# Patient Record
Sex: Male | Born: 1960
Health system: Southern US, Community
[De-identification: ages and names within clinical notes are randomized; demographics above are authoritative.]

## PROBLEM LIST (undated history)

## (undated) DIAGNOSIS — J45909 Unspecified asthma, uncomplicated: Secondary | ICD-10-CM

## (undated) DIAGNOSIS — E7800 Pure hypercholesterolemia, unspecified: Secondary | ICD-10-CM

## (undated) DIAGNOSIS — L509 Urticaria, unspecified: Secondary | ICD-10-CM

## (undated) DIAGNOSIS — G459 Transient cerebral ischemic attack, unspecified: Secondary | ICD-10-CM

## (undated) HISTORY — DX: Urticaria, unspecified: L50.9

## (undated) HISTORY — DX: Transient cerebral ischemic attack, unspecified: G45.9

## (undated) HISTORY — DX: Unspecified asthma, uncomplicated: J45.909

---

## 2002-12-18 ENCOUNTER — Emergency Department (HOSPITAL_COMMUNITY): Admission: EM | Admit: 2002-12-18 | Discharge: 2002-12-18 | Payer: Self-pay | Admitting: Emergency Medicine

## 2002-12-18 ENCOUNTER — Encounter: Payer: Self-pay | Admitting: Emergency Medicine

## 2005-12-25 DIAGNOSIS — G459 Transient cerebral ischemic attack, unspecified: Secondary | ICD-10-CM

## 2005-12-25 HISTORY — DX: Transient cerebral ischemic attack, unspecified: G45.9

## 2007-11-27 ENCOUNTER — Ambulatory Visit (HOSPITAL_BASED_OUTPATIENT_CLINIC_OR_DEPARTMENT_OTHER): Admission: RE | Admit: 2007-11-27 | Discharge: 2007-11-27 | Payer: Self-pay | Admitting: Urology

## 2010-05-29 ENCOUNTER — Ambulatory Visit: Payer: Self-pay | Admitting: Family Medicine

## 2010-05-29 DIAGNOSIS — J020 Streptococcal pharyngitis: Secondary | ICD-10-CM | POA: Insufficient documentation

## 2010-05-29 DIAGNOSIS — E785 Hyperlipidemia, unspecified: Secondary | ICD-10-CM

## 2011-01-24 NOTE — Letter (Signed)
Summary: Handout Printed  Printed Handout:  - Rheumatic Fever 

## 2011-01-24 NOTE — Assessment & Plan Note (Signed)
Summary: FEVER,CHILLS,NIGHTSWEATS, JOINT STIFFNESS/TJ x 3dys rm 3   Vital Signs:  Patient Profile:   50 Years Old Male CC:      Bite - Behind R calf x 3 dys Height:     71 inches Weight:      212 pounds O2 Sat:      100 % O2 treatment:    Room Air Temp:     100.9 degrees F oral Pulse rate:   78 / minute Pulse rhythm:   regular Resp:     16 per minute BP sitting:   134 / 89  (right arm) Cuff size:   regular  Vitals Entered By: Areta Haber CMA (May 29, 2010 3:43 PM)                  Current Allergies: No known allergies History of Present Illness Chief Complaint: Bite - Behind R calf x 3 dys History of Present Illness: Subjective: Patient complains of mild sore throat and sinus congestion for 2 days.  Three days ago he had some type of insect bite on his right posterior calf while in bed, now resolved. No cough No pleuritic pain No wheezing No post-nasal drainage No sinus pain/pressure No itchy/red eyes No earache No hemoptysis No SOB No fever, + chills No nausea No vomiting No abdominal pain No diarrhea No skin rashes + fatigue + myalgias +headache    Current Problems: PHARYNGITIS, STREPTOCOCCAL (ICD-034.0) HYPERLIPIDEMIA (ICD-272.4)   Current Meds LIPITOR 20 MG TABS (ATORVASTATIN CALCIUM) 1 tab by mouth once daily ASPIR-LOW 81 MG TBEC (ASPIRIN) 1 tab by mouth once daily PENICILLIN V POTASSIUM 500 MG TABS (PENICILLIN V POTASSIUM) 1 by mouth Q8hr for 10 days  REVIEW OF SYSTEMS Constitutional Symptoms       Complains of fever, chills, night sweats, and fatigue.     Denies weight loss and weight gain.  Eyes       Denies change in vision, eye pain, eye discharge, glasses, contact lenses, and eye surgery. Ear/Nose/Throat/Mouth       Denies hearing loss/aids, change in hearing, ear pain, ear discharge, dizziness, frequent runny nose, frequent nose bleeds, sinus problems, sore throat, hoarseness, and tooth pain or bleeding.  Respiratory       Denies  dry cough, productive cough, wheezing, shortness of breath, asthma, bronchitis, and emphysema/COPD.  Cardiovascular       Denies murmurs, chest pain, and tires easily with exhertion.    Gastrointestinal       Denies stomach pain, nausea/vomiting, diarrhea, constipation, blood in bowel movements, and indigestion. Genitourniary       Denies painful urination, kidney stones, and loss of urinary control. Neurological       Denies paralysis, seizures, and fainting/blackouts. Musculoskeletal       Complains of joint pain and joint stiffness.      Denies muscle pain, decreased range of motion, redness, swelling, muscle weakness, and gout.  Skin       Denies bruising, unusual mles/lumps or sores, and hair/skin or nail changes.  Psych       Denies mood changes, temper/anger issues, anxiety/stress, speech problems, depression, and sleep problems. Other Comments: Pt states he was bitten x 3 dys ago by bug(spider?).  Pt states he started feeling joint stiffness on Friday am while running.    Past History:  Past Medical History: Hyperlipidemia  Past Surgical History: Denies surgical history  Family History: Family History High cholesterol Family History Lung cancer Family History Ovarian cancer  Social  History: Never Smoked Alcohol use-yes- occasionally Drug use-no Regular exercise-yes Married Smoking Status:  never Drug Use:  no Does Patient Exercise:  yes   Objective:  Appearance:  Patient appears healthy, stated age, and in no acute distress  Eyes:  Pupils are equal, round, and reactive to light and accomdation.  Extraocular movement is intact.  Conjunctivae are not inflamed.  Ears:  Canals normal.  Tympanic membranes normal.   Nose:  Mildly congested, no sinus tenderness Pharynx:  Erythematous and slightly swollen without obstruction.  Minimal exudate.  Neck:  Supple.  Slightly tender shotty anterior nodes are palpated bilaterally.  Lungs:  Clear to auscultation.  Breath  sounds are equal.  Heart:  Regular rate and rhythm without murmurs, rubs, or gallops.  Abdomen:  Nontender without masses or hepatosplenomegaly.  Bowel sounds are present.  No CVA or flank tenderness.  Skin:  On the right lower leg there is a 3mm red macule without tenderness Rapid strep test positive Assessment New Problems: PHARYNGITIS, STREPTOCOCCAL (ICD-034.0) HYPERLIPIDEMIA (ICD-272.4)   Plan New Medications/Changes: PENICILLIN V POTASSIUM 500 MG TABS (PENICILLIN V POTASSIUM) 1 by mouth Q8hr for 10 days  #30 x 0, 05/29/2010, Donna Christen MD  New Orders: New Patient Level III [99203] Rapid Strep [98119] Planning Comments:   Begin penicillin 500mg  three times a day for 10 days.  Ibuprofen 200mg , 4 tabs every 8 hours with food  Warm saline gargles. Follow-up wtih PCP if not improving   The patient and/or caregiver has been counseled thoroughly with regard to medications prescribed including dosage, schedule, interactions, rationale for use, and possible side effects and they verbalize understanding.  Diagnoses and expected course of recovery discussed and will return if not improved as expected or if the condition worsens. Patient and/or caregiver verbalized understanding.  Prescriptions: PENICILLIN V POTASSIUM 500 MG TABS (PENICILLIN V POTASSIUM) 1 by mouth Q8hr for 10 days  #30 x 0   Entered and Authorized by:   Donna Christen MD   Signed by:   Donna Christen MD on 05/29/2010   Method used:   Print then Give to Patient   RxID:   [id number]   Orders Added: 1)  New Patient Level III [84696] 2)  Rapid Strep [29528]

## 2011-05-09 NOTE — Op Note (Signed)
NAMEKeiondre, [name]                   ACCOUNT NO.:  [id number]   MEDICAL RECORD NO.:  [id number]          PATIENT TYPE:  AMB   LOCATION:  [facility]                         FACILITY:  [facility]   PHYSICIAN:  Courtney Paris, M.D.DATE OF BIRTH:  [birth year]   DATE OF PROCEDURE:  11/27/2007  DATE OF DISCHARGE:                               OPERATIVE REPORT   PREOPERATIVE DIAGNOSIS:  Phimosis.   POSTOPERATIVE DIAGNOSIS:  Phimosis.   OPERATION:  Circumcision revision.   ANESTHESIA:  General.   SURGEON:  Courtney Paris, M.D.   BRIEF HISTORY:  This 50 year old black male has a band of skin covering  over part of his glans penis causing phimosis who enters now for  revision.  It collects bacteria causing irritation.  He does have a  history of herpes but no urinary symptoms.   The patient was placed on the operating table in the supine position.  After satisfactory induction of general anesthesia, he was prepped and  draped with Hibiclens because of his iodine allergy.  He was prepped and  draped in the usual sterile fashion.  The band of skin covered about 5  to 6 cm, covered over the dorsal right side of the glans.  A Hemostat  could be passed through this at the coronal sulcus.  The redundant skin  was then marked off with a marking pen and incised with a knife blade  and then using the Bovie with fine-needle electrode.  The rest of the  prep was then completed when this was opened.  The skin attached to the  glans was then closed with interrupted 4-0 chromic catgut suture to  effect a nice cosmetic result.  A dog ear of excess skin was then  removed off the penile shaft, and this defect closed with interrupted 4-  0 chromic catgut suture as well.  This afforded a nice cosmetic result  and no longer had phimosis.  A little bit of 1 mL of 0.25% Marcaine was  injected into the incision for postoperative pain, and a dressing of  collodion, Vaseline gauze, sterile gauze and Coban was  then used to  apply for a dressing.  The patient was taken to the recovery room in  good condition to be later discharged as an outpatient with detailed  written instructions.      Courtney Paris, M.D.  Electronically Signed     HMK/MEDQ  D:  11/27/2007  T:  11/27/2007  Job:  [id number]

## 2011-10-02 LAB — COMPREHENSIVE METABOLIC PANEL
ALT: 28
AST: 29
Albumin: 3.7
Alkaline Phosphatase: 63
BUN: 9
CO2: 30
Calcium: 9.7
Chloride: 104
Creatinine, Ser: 1.2
GFR calc Af Amer: >60
GFR calc non Af Amer: >60
Glucose, Bld: 92
Potassium: 4.6
Sodium: 141
Total Bilirubin: 1.1
Total Protein: 7

## 2011-10-02 LAB — CBC
HCT: 42.5
Hemoglobin: 14.5
MCHC: 34.2
MCV: 89.2
Platelets: 246
RBC: 4.76
RDW: 13.7
WBC: 5.4

## 2013-12-03 ENCOUNTER — Other Ambulatory Visit: Payer: Self-pay | Admitting: Orthopedic Surgery

## 2013-12-03 DIAGNOSIS — M25611 Stiffness of right shoulder, not elsewhere classified: Secondary | ICD-10-CM

## 2013-12-03 DIAGNOSIS — M25511 Pain in right shoulder: Secondary | ICD-10-CM

## 2013-12-22 ENCOUNTER — Ambulatory Visit
Admission: RE | Admit: 2013-12-22 | Discharge: 2013-12-22 | Disposition: A | Discharge status: Home / Self Care | Payer: BC Managed Care – PPO | Source: Ambulatory Visit | Attending: Orthopedic Surgery | Admitting: Orthopedic Surgery

## 2013-12-22 DIAGNOSIS — M25611 Stiffness of right shoulder, not elsewhere classified: Secondary | ICD-10-CM

## 2013-12-22 DIAGNOSIS — M25511 Pain in right shoulder: Secondary | ICD-10-CM

## 2015-12-02 ENCOUNTER — Encounter: Payer: Self-pay | Admitting: Internal Medicine

## 2015-12-02 ENCOUNTER — Ambulatory Visit (INDEPENDENT_AMBULATORY_CARE_PROVIDER_SITE_OTHER): Payer: BLUE CROSS/BLUE SHIELD | Admitting: Internal Medicine

## 2015-12-02 VITALS — BP 112/66 | HR 64 | Ht 72.0 in | Wt 230.2 lb

## 2015-12-02 DIAGNOSIS — J4530 Mild persistent asthma, uncomplicated: Secondary | ICD-10-CM | POA: Diagnosis not present

## 2015-12-02 DIAGNOSIS — R0600 Dyspnea, unspecified: Secondary | ICD-10-CM | POA: Diagnosis not present

## 2015-12-02 LAB — NITRIC OXIDE: Nitric Oxide: 71

## 2015-12-02 MED ORDER — MOMETASONE FURO-FORMOTEROL FUM 100-5 MCG/ACT IN AERO: INHALATION_SPRAY | RESPIRATORY_TRACT | Status: DC

## 2015-12-02 NOTE — Assessment & Plan Note (Signed)
Complicated by hyperlipidemia/ ? gerd  Body mass index is 31.21 kg/(m^2).  No results found for: TSH   Contributing to gerd tendency/  reviewed the need and the process to achieve and maintain neg calorie balance > defer f/u primary care including intermittently monitoring thyroid status

## 2015-12-02 NOTE — Progress Notes (Signed)
Subjective:    Patient ID: Scott Brandt, male    DOB: 1961/10/04,    MRN: [id number]  HPI  [age] yobm never smoker ? Some asthma as child but he has no recollection of symptoms/ need for meds and very athletic including marathon running but developed nasal symptoms x 2011  then became aerobically less active but then 2015 noted variable wheezing sounds but no real effect ex capacity  or effect sleep so self referred to pulmonary 12/02/2015 p instructed to use saba with variable results    12/02/2015 1st [facility] office visit/ [facility]   Chief Complaint  Patient presents with  . Pulmonary Consult    Self referral. Pt c/o wheezing on and off, mainly in the evening for the past yr.   [place]  pos [place] / does not remember the rest  rx wit allegra / flonase ok control  Wheezing occurs inconsistently p supper typically but not every night ? Related to what he eats? No change if uses proair 30 min before ex but if he uses the night before he runs he thinks it improves his exercise tolerance a little  No obvious other patterns in day to day or daytime variabilty or assoc chronic cough or cp or chest tightness,   overt sinus or hb symptoms. No unusual exp hx or h/o childhood pna/ asthma or knowledge of premature birth.  Sleeping ok without nocturnal  or early am exacerbation  of respiratory  c/o's or need for noct saba. Also denies any obvious fluctuation of symptoms with weather or environmental changes or other aggravating or alleviating factors except as outlined above   Current Medications, Allergies, Complete Past Medical History, Past Surgical History, Family History, and Social History were reviewed in [facility] electronic medical record.            Review of Systems  Constitutional: Negative for fever, chills, activity change, appetite change and unexpected weight change.  HENT: Negative for congestion, dental problem, postnasal drip, rhinorrhea,  sneezing, sore throat, trouble swallowing and voice change.   Eyes: Negative for visual disturbance.  Respiratory: Negative for cough, choking and shortness of breath.   Cardiovascular: Negative for chest pain and leg swelling.  Gastrointestinal: Negative for nausea, vomiting and abdominal pain.  Genitourinary: Negative for difficulty urinating.  Musculoskeletal: Negative for arthralgias.  Skin: Negative for rash.  Psychiatric/Behavioral: Negative for behavioral problems and confusion.       Objective:   Physical Exam  amb pleasant bm nad  Wt Readings from Last 3 Encounters:  12/02/15 230 lb 3.2 oz (104.418 kg)  05/29/10 212 lb (96.163 kg)    Vital signs reviewed  HEENT: nl dentition, turbinates, and oropharynx. Nl external ear canals without cough reflex   NECK :  without JVD/Nodes/TM/ nl carotid upstrokes bilaterally   LUNGS: no acc muscle use,  Nl contour chest which is clear to A and P bilaterally without cough on insp or exp maneuvers   CV:  RRR  no s3 or murmur or increase in P2, no edema   ABD:  soft and nontender with nl inspiratory excursion in the supine position. No bruits or organomegaly, bowel sounds nl  MS:  Nl gait/ ext warm without deformities, calf tenderness, cyanosis or clubbing No obvious joint restrictions   SKIN: warm and dry without lesions    NEURO:  alert, approp, nl sensorium with  no motor deficits         Assessment &  Plan:

## 2015-12-02 NOTE — Assessment & Plan Note (Signed)
-    N0 12/02/2015  = 71  -  Spirometry 12/02/2015  FEV1  2.78(81%) ratio 72 with fef 25-75 55%  - 12/02/2015  extensive coaching HFA effectiveness =    75% > try dulera 100 2bid  Strongly suspect that he has developed mild allergic asthma given the background of allergic rhinitis although some of his symptoms seem a bit atypical such as the need to use the albuterol the night before he exercises rather than 5 minutes before. That may be because his baseline HFA was poor and he wasn't getting the full benefit  Discussed in detail all the  indications, usual  risks and alternatives  relative to the benefits with patient who agrees to proceed with conservative rx with dulera trial and f/u 6 wks with prev allergy w/u in hand   Total time devoted to counseling  = 35/5073m review case with pt/ discussion of options/alternatives/ giving and going over instructions (see avs)

## 2015-12-02 NOTE — Patient Instructions (Addendum)
GERD (REFLUX)  is an extremely common cause of respiratory symptoms just like yours , many times with no obvious heartburn at all.    It can be treated with medication, but also with lifestyle changes including elevation of the head of your bed (ideally with 6 inch  bed blocks),  Smoking cessation, avoidance of late meals, excessive alcohol, and avoid fatty foods, chocolate, peppermint, colas, red wine, and acidic juices such as orange juice.  NO MINT OR MENTHOL PRODUCTS SO NO COUGH DROPS  USE SUGARLESS CANDY INSTEAD (Jolley ranchers or Stover's or Life Savers) or even ice chips will also do - the key is to swallow to prevent all throat clearing. NO OIL BASED VITAMINS - use powdered substitutes.    dulera 100 Take 2 puffs first thing in am and then another 2 puffs about 12 hours later x 2 week trial  - if doing great, ok to reduce to 1 one twice daily, if not then stop it and see what difference it makes    Please schedule a follow up office visit in 6 weeks, call sooner if needed bring your allergy work up with you

## 2015-12-08 ENCOUNTER — Ambulatory Visit (INDEPENDENT_AMBULATORY_CARE_PROVIDER_SITE_OTHER): Payer: BLUE CROSS/BLUE SHIELD | Admitting: Allergy and Immunology

## 2015-12-08 ENCOUNTER — Encounter: Payer: Self-pay | Admitting: Allergy and Immunology

## 2015-12-08 VITALS — BP 128/72 | HR 80 | Temp 97.8°F | Resp 18

## 2015-12-08 DIAGNOSIS — J4530 Mild persistent asthma, uncomplicated: Secondary | ICD-10-CM

## 2015-12-08 DIAGNOSIS — T7800XA Anaphylactic reaction due to unspecified food, initial encounter: Secondary | ICD-10-CM | POA: Insufficient documentation

## 2015-12-08 DIAGNOSIS — J3089 Other allergic rhinitis: Secondary | ICD-10-CM | POA: Diagnosis not present

## 2015-12-08 MED ORDER — AZELASTINE-FLUTICASONE 137-50 MCG/ACT NA SUSP: 1.0000 | Freq: Two times a day (BID) | NASAL | Status: DC

## 2015-12-08 MED ORDER — MONTELUKAST SODIUM 10 MG PO TABS
10.0000 mg | ORAL_TABLET | Freq: Every day | ORAL | Status: DC
Start: 2015-12-08 — End: 2016-01-13

## 2015-12-08 MED ORDER — LEVOCETIRIZINE DIHYDROCHLORIDE 5 MG PO TABS: 5.0000 mg | ORAL_TABLET | Freq: Every evening | ORAL | Status: DC

## 2015-12-08 NOTE — Patient Instructions (Addendum)
Perennial and seasonal allergic rhinitis  Aeroallergen avoidance measures have been discussed and provided in written form.  A prescription has been provided for Dymista (azelastine/fluticasone) nasal spray, 1 spray per nostril twice daily as needed. Proper nasal spray technique has been discussed and demonstrated.  A prescription has been provided for montelukast 10 mg daily at bedtime.  A prescription has been provided for levocetirizine, 5 mg daily as needed.  The risks and benefits of aeroallergen immunotherapy have been discussed. The patient is motivated to initiate immunotherapy to reduce symptoms and decrease medication requirement. Informed consent has been signed and allergen vaccine orders have been submitted. Medications will be decreased or discontinued as symptom relief from immunotherapy becomes evident.  Mild persistent asthma in adult without complication  For now, continue Dulera 100/5 g, 2 inhalations twice a day.  To maximize pulmonary deposition, a spacer has been provided along with instructions for its proper administration with an HFA inhaler.  Upper prescription has been provided for montelukast (as above).  Continue albuterol HFA, 1-2 inhalations every 4-6 hours as needed.  Allergy with anaphylaxis due to food  Meticulous avoidance of shellfish as discussed.  Continue to have access of epinephrine auto-injector 2 pack. Iinstructions for proper administration have been provided.  A food allergy action plan has been provided and discussed.  Medic Alert identification is recommended.    Return in about 8 weeks (around 02/02/2016), or if symptoms worsen or fail to improve.  Reducing Pollen Exposure  The American Academy of Allergy, Asthma and Immunology suggests the following steps to reduce your exposure to pollen during allergy seasons.    1. Do not hang sheets or clothing out to dry; pollen may collect on these items. 2. Do not mow lawns or spend time  around freshly cut grass; mowing stirs up pollen. 3. Keep windows closed at night.  Keep car windows closed while driving. 4. Minimize morning activities outdoors, a time when pollen counts are usually at their highest. 5. Stay indoors as much as possible when pollen counts or humidity is high and on windy days when pollen tends to remain in the air longer. 6. Use air conditioning when possible.  Many air conditioners have filters that trap the pollen spores. 7. Use a HEPA room air filter to remove pollen form the indoor air you breathe.   Control of House Dust Mite Allergen  House dust mites play a major role in allergic asthma and rhinitis.  They occur in environments with high humidity wherever human skin, the food for dust mites is found. High levels have been detected in dust obtained from mattresses, pillows, carpets, upholstered furniture, bed covers, clothes and soft toys.  The principal allergen of the house dust mite is found in its feces.  A gram of dust may contain 1,000 mites and 250,000 fecal particles.  Mite antigen is easily measured in the air during house cleaning activities.    1. Encase mattresses, including the box spring, and pillow, in an air tight cover.  Seal the zipper end of the encased mattresses with wide adhesive tape. 2. Wash the bedding in water of 130 degrees Farenheit weekly.  Avoid cotton comforters/quilts and flannel bedding: the most ideal bed covering is the dacron comforter. 3. Remove all upholstered furniture from the bedroom. 4. Remove carpets, carpet padding, rugs, and non-washable window drapes from the bedroom.  Wash drapes weekly or use plastic window coverings. 5. Remove all non-washable stuffed toys from the bedroom.  Wash stuffed toys weekly. 6.  Have the room cleaned frequently with a vacuum cleaner and a damp dust-mop.  The patient should not be in a room which is being cleaned and should wait 1 hour after cleaning before going into the  room. 7. Close and seal all heating outlets in the bedroom.  Otherwise, the room will become filled with dust-laden air.  An electric heater can be used to heat the room. Reduce indoor humidity to less than 50%.  Do not use a humidifier.  Control of Dog or Cat Allergen  Avoidance is the best way to manage a dog or cat allergy. If you have a dog or cat and are allergic to dog or cats, consider removing the dog or cat from the home. If you have a dog or cat but don't want to find it a new home, or if your family wants a pet even though someone in the household is allergic, here are some strategies that may help keep symptoms at bay:  1. Keep the pet out of your bedroom and restrict it to only a few rooms. Be advised that keeping the dog or cat in only one room will not limit the allergens to that room. 2. Don't pet, hug or kiss the dog or cat; if you do, wash your hands with soap and water. 3. High-efficiency particulate air (HEPA) cleaners run continuously in a bedroom or living room can reduce allergen levels over time. 4. Regular use of a high-efficiency vacuum cleaner or a central vacuum can reduce allergen levels. 5. Giving your dog or cat a bath at least once a week can reduce airborne allergen.  Control of Mold Allergen  Mold and fungi can grow on a variety of surfaces provided certain temperature and moisture conditions exist.  Outdoor molds grow on plants, decaying vegetation and soil.  The major outdoor mold, Alternaria and Cladosporium, are found in very high numbers during hot and dry conditions.  Generally, a late Summer - Fall peak is seen for common outdoor fungal spores.  Rain will temporarily lower outdoor mold spore count, but counts rise rapidly when the rainy period ends.  The most important indoor molds are Aspergillus and Penicillium.  Dark, humid and poorly ventilated basements are ideal sites for mold growth.  The next most common sites of mold growth are the bathroom and the  kitchen.  Outdoor [employer] 2. Use air conditioning and keep windows closed 3. Avoid exposure to decaying vegetation. 4. Avoid leaf raking. 5. Avoid grain handling. 6. Consider wearing a face mask if working in moldy areas.  Indoor Mold Control 1. Maintain humidity below 50%. 2. Clean washable surfaces with 5% bleach solution. 3. Remove sources e.g. Contaminated carpets.  Control of Cockroach Allergen  Cockroach allergen has been identified as an important cause of acute attacks of asthma, especially in urban settings.  There are fifty-five species of cockroach that exist in the [place], however only three, the [place], [place] species produce allergen that can affect patients with Asthma.  Allergens can be obtained from fecal particles, egg casings and secretions from cockroaches.    1. Remove food sources. 2. Reduce access to water. 3. Seal access and entry points. 4. Spray runways with 0.5-1% Diazinon or Chlorpyrifos 5. Blow boric acid power under stoves and refrigerator. 6. Place bait stations (hydramethylnon) at feeding sites.

## 2015-12-08 NOTE — Progress Notes (Addendum)
NEW PATIENT NOTE  RE: Scott Brandt MRN: [id number] DOB: [birth year] [facility] [place] [address] [place] [id number] Date of Office Visit: 12/08/2015  Referring provider: No referring provider defined for this encounter.  Chief Complaint: Wheezing; Allergic Rhinitis ; and Food Intolerance   History of present illness: HPI Comments: Scott Pullingric L Holstrom is a 54 y.o. male  Who presents today for his initial evaluation. He complains of nasal congestion , whistling through the nostrils, postnasal drainage.  These symptoms occur year around but her more severe in the springtime and in the fall.  He had childhood asthma , however his asthma symptoms seem to resolve through adolescence and early adulthood. Over this past year, he has experienced episodes of dyspnea and wheezing. He was evaluated his pulmonologist, Dr. Sherene SiresWert,  last week and started on Dulera 100/5 g, 2 inhalations twice a day.  Minerva Areolaric states that on multiple occasions he has experienced laryngeal pruritus, dyspnea, and chest tightness with the consumption of crab, shrimp, and lobster. He avoids shellfish and has access to an epinephrine autoinjector 2 pack.   Assessment and plan: Perennial and seasonal allergic rhinitis  Aeroallergen avoidance measures have been discussed and provided in written form.  A prescription has been provided for Dymista (azelastine/fluticasone) nasal spray, 1 spray per nostril twice daily as needed. Proper nasal spray technique has been discussed and demonstrated.  A prescription has been provided for montelukast 10 mg daily at bedtime.  A prescription has been provided for levocetirizine, 5 mg daily as needed.  The risks and benefits of aeroallergen immunotherapy have been discussed. The patient is motivated to initiate immunotherapy to reduce symptoms and decrease medication requirement. Informed consent has been signed and allergen vaccine orders  have been submitted. Medications will be decreased or discontinued as symptom relief from immunotherapy becomes evident.  Mild persistent asthma in adult without complication  For now, continue Dulera 100/5 g, 2 inhalations twice a day.  To maximize pulmonary deposition, a spacer has been provided along with instructions for its proper administration with an HFA inhaler.  Upper prescription has been provided for montelukast (as above).  Continue albuterol HFA, 1-2 inhalations every 4-6 hours as needed.  Allergy with anaphylaxis due to food  Meticulous avoidance of shellfish as discussed.  Continue to have access of epinephrine auto-injector 2 pack. Iinstructions for proper administration have been provided.  A food allergy action plan has been provided and discussed.  Medic Alert identification is recommended.    Medications ordered this encounter: Meds ordered this encounter  Medications  . Azelastine-Fluticasone (DYMISTA) 137-50 MCG/ACT SUSP    Sig: Place 1 spray into both nostrils 2 (two) times daily.    Dispense:  1 Bottle    Refill:  5  . DISCONTD: montelukast (SINGULAIR) 10 MG tablet    Sig: Take 1 tablet (10 mg total) by mouth at bedtime.    Dispense:  30 tablet    Refill:  3  . DISCONTD: levocetirizine (XYZAL) 5 MG tablet    Sig: Take 1 tablet (5 mg total) by mouth every evening.    Dispense:  30 tablet    Refill:  5    Diagnositics: Spirometry: FVC was 3.17 L and FEV1 was 2.74 L (86% predicted) with significant (470 mL, 17%) post bronchodilator improvement. Allergy skin testing: Positive to grass pollen, weed pollen, ragweed pollen, tree pollen, mold, cat hair, dog epithelia, dust mite, cockroach antigen.  Physical examination: Blood pressure 128/72, pulse 80, temperature 97.8 F (36.6 C), resp. rate 18, SpO2 98 %.  General: Alert, interactive, in no acute distress. HEENT: TMs pearly gray, turbinates edematous without discharge, post-pharynx  erythematous. Neck: Supple without lymphadenopathy. Lungs: Clear to auscultation without wheezing, rhonchi or rales. CV: Normal S1, S2 without murmurs. Abdomen: Nondistended, nontender. Skin: Warm and dry, without lesions or rashes. Extremities:  No clubbing, cyanosis or edema. Neuro:   Grossly intact.  Review of systems: Review of Systems  Constitutional: Negative for fever, chills and weight loss.  HENT: Positive for congestion. Negative for nosebleeds.   Eyes: Negative for blurred vision.  Respiratory: Positive for shortness of breath and wheezing. Negative for hemoptysis.   Cardiovascular: Negative for chest pain.  Gastrointestinal: Negative for diarrhea and constipation.  Genitourinary: Negative for dysuria.  Musculoskeletal: Negative for myalgias and joint pain.  Skin: Negative for itching and rash.  Neurological: Negative for dizziness.  Endo/Heme/Allergies: Positive for environmental allergies. Does not bruise/bleed easily.    Past medical history: Past Medical History  Diagnosis Date  . Urticaria   . Asthma     signs of asthma from Dr Sherene Sires     Past surgical history: No past surgical history on file.  Family history: Family History  Problem Relation Age of Onset  . Allergic rhinitis Father   . Angioedema Neg Hx   . Asthma Neg Hx   . Atopy Neg Hx   . Eczema Neg Hx   . Immunodeficiency Neg Hx   . Urticaria Neg Hx     Social history: Social History   Social History  . Marital Status: Married    Spouse Name: N/A  . Number of Children: N/A  . Years of Education: N/A   Occupational History  . Not on file.   Social History Main Topics  . Smoking status: Never Smoker   . Smokeless tobacco: Never Used  . Alcohol Use: No  . Drug Use: No  . Sexual Activity: Not on file   Other Topics Concern  . Not on file   Social History Narrative   Environmental History:  Hanish lives in an 86-year-old house with hardwood floors throughout and central air/heat.   There is a cat in the house which has access to his bedroom.  He is a nonsmoker and there are no smokers in the household.  Known medication allergies: Allergies  Allergen Reactions  . Shellfish Allergy Anaphylaxis    Outpatient medications:   Medication List       This list is accurate as of: 12/08/15 11:59 PM.  Always use your most recent med list.               aspirin 81 MG tablet  Take 81 mg by mouth daily.     Azelastine-Fluticasone 137-50 MCG/ACT Susp  Commonly known as:  DYMISTA  Place 1 spray into both nostrils 2 (two) times daily.     fexofenadine 180 MG tablet  Commonly known as:  ALLEGRA  Take 180 mg by mouth as needed.     fluticasone 50 MCG/ACT nasal spray  Commonly known as:  FLONASE  Place 2 sprays into both nostrils daily as needed.     levocetirizine 5 MG tablet  Commonly known as:  XYZAL  Take 1 tablet (5 mg total) by mouth every evening.     LIPITOR PO  Take 1 tablet by mouth daily.     mometasone-formoterol 100-5 MCG/ACT Aero  Commonly known as:  [employer]  Take 2 puffs first thing in am and then another 2 puffs about 12 hours later.     montelukast 10 MG tablet  Commonly known as:  SINGULAIR  Take 1 tablet (10 mg total) by mouth at bedtime.     PROAIR HFA 108 (90 Base) MCG/ACT inhaler  Generic drug:  albuterol  Inhale 2 puffs into the lungs every 4 (four) hours as needed.        I appreciate the opportunity to take part in this Idrees's care. Please do not hesitate to contact me with questions.  Sincerely,   R. Jorene Guest, MD

## 2015-12-08 NOTE — Assessment & Plan Note (Addendum)
   For now, continue Dulera 100/5 g, 2 inhalations twice a day.  To maximize pulmonary deposition, a spacer has been provided along with instructions for its proper administration with an HFA inhaler.  Upper prescription has been provided for montelukast (as above).  Continue albuterol HFA, 1-2 inhalations every 4-6 hours as needed.

## 2015-12-08 NOTE — Assessment & Plan Note (Addendum)
   Aeroallergen avoidance measures have been discussed and provided in written form.  A prescription has been provided for Dymista (azelastine/fluticasone) nasal spray, 1 spray per nostril twice daily as needed. Proper nasal spray technique has been discussed and demonstrated.  A prescription has been provided for montelukast 10 mg daily at bedtime.  A prescription has been provided for levocetirizine, 5 mg daily as needed.  The risks and benefits of aeroallergen immunotherapy have been discussed. The patient is motivated to initiate immunotherapy to reduce symptoms and decrease medication requirement. Informed consent has been signed and allergen vaccine orders have been submitted. Medications will be decreased or discontinued as symptom relief from immunotherapy becomes evident.

## 2015-12-08 NOTE — Assessment & Plan Note (Addendum)
   Meticulous avoidance of shellfish as discussed.  Continue to have access of epinephrine auto-injector 2 pack. Iinstructions for proper administration have been provided.  A food allergy action plan has been provided and discussed.  Medic Alert identification is recommended.

## 2015-12-09 DIAGNOSIS — J3081 Allergic rhinitis due to animal (cat) (dog) hair and dander: Secondary | ICD-10-CM | POA: Diagnosis not present

## 2015-12-10 DIAGNOSIS — J3089 Other allergic rhinitis: Secondary | ICD-10-CM | POA: Diagnosis not present

## 2016-01-13 ENCOUNTER — Encounter: Payer: Self-pay | Admitting: Internal Medicine

## 2016-01-13 ENCOUNTER — Ambulatory Visit (INDEPENDENT_AMBULATORY_CARE_PROVIDER_SITE_OTHER): Payer: BLUE CROSS/BLUE SHIELD | Admitting: Internal Medicine

## 2016-01-13 VITALS — BP 138/88 | HR 71 | Ht 72.0 in | Wt 226.0 lb

## 2016-01-13 DIAGNOSIS — J4530 Mild persistent asthma, uncomplicated: Secondary | ICD-10-CM | POA: Diagnosis not present

## 2016-01-13 NOTE — Progress Notes (Signed)
Subjective:    Patient ID: Scott Brandt, male    DOB: [birth year],    MRN: [id number]    Brief patient profile:  54 yobm never smoker ? Some asthma as child but he has no recollection of symptoms/ need for meds and very athletic including marathon running but developed nasal symptoms x 2011  then became aerobically less active but then 2015 noted variable wheezing sounds but no real effect ex capacity  or effect sleep so self referred to pulmonary 12/02/2015 p instructed to use saba with variable results    History of Present Illness  12/02/2015 1st [facility] office visit/ Radek Carnero   Chief Complaint  Patient presents with  . Pulmonary Consult    Self referral. Pt c/o wheezing on and off, mainly in the evening for the past yr.   eval by allergist = Kathryne Sharper 2013  pos trees / does not remember the rest  rx with allegra / flonase ok control  Wheezing occurs inconsistently p supper typically but not every night ? Related to what he eats? No change if uses proair 30 min before ex but if he uses the night before he runs he thinks it improves his exercise tolerance a little rec GERD diet  Dulera 100 Take 2 puffs first thing in am and then another 2 puffs about 12 hours later x 2 week trial  - if doing great, ok to reduce to 1 one twice daily, if not then stop it and see what difference it makes  Please schedule a follow up office visit in 6 weeks, call sooner if needed bring your allergy work up with you > did not do    01/13/2016  f/u ov/Allaina Brotzman re: asthma/ atopic per allergist in [place] who instructed him to use spacer  Chief Complaint  Patient presents with  . Follow-up    Breathing has improved some. He has not noticed anymore wheezing.    Not limited by breathing from desired activities  / ran up to 2 miles s difficulty or need for saba   No obvious day to day or daytime variability or assoc chronic cough or cp or chest tightness, subjective wheeze or overt sinus or hb symptoms.  No unusual exp hx or h/o childhood pna/ asthma or knowledge of premature birth.  Sleeping ok without nocturnal  or early am exacerbation  of respiratory  c/o's or need for noct saba. Also denies any obvious fluctuation of symptoms with weather or environmental changes or other aggravating or alleviating factors except as outlined above   Current Medications, Allergies, Complete Past Medical History, Past Surgical History, Family History, and Social History were reviewed in [employer] record.  ROS  The following are not active complaints unless bolded sore throat, dysphagia, dental problems, itching, sneezing,  nasal congestion or excess/ purulent secretions, ear ache,   fever, chills, sweats, unintended wt loss, classically pleuritic or exertional cp, hemoptysis,  orthopnea pnd or leg swelling, presyncope, palpitations, abdominal pain, anorexia, nausea, vomiting, diarrhea  or change in bowel or bladder habits, change in stools or urine, dysuria,hematuria,  rash, arthralgias, visual complaints, headache, numbness, weakness or ataxia or problems with walking or coordination,  change in mood/affect or memory.              Objective:   Physical Exam  amb pleasant bm nad  Wt Readings from Last 3 Encounters:  01/13/16 226 lb (102.513 kg)  12/02/15 230 lb 3.2 oz (104.418 kg)  05/29/10 212  lb (96.163 kg)    Vital signs reviewed   HEENT: nl dentition, turbinates, and oropharynx. Nl external ear canals without cough reflex   NECK :  without JVD/Nodes/TM/ nl carotid upstrokes bilaterally   LUNGS: no acc muscle use,  Nl contour chest which is clear to A and P bilaterally without cough on insp or exp maneuvers   CV:  RRR  no s3 or murmur or increase in P2, no edema   ABD:  soft and nontender with nl inspiratory excursion in the supine position. No bruits or organomegaly, bowel sounds nl  MS:  Nl gait/ ext warm without deformities, calf tenderness, cyanosis or  clubbing No obvious joint restrictions   SKIN: warm and dry without lesions    NEURO:  alert, approp, nl sensorium with  no motor deficits         Assessment & Plan:   Outpatient Encounter Prescriptions as of 01/13/2016  Medication Sig  . aspirin 81 MG tablet Take 81 mg by mouth daily.  . Atorvastatin Calcium (LIPITOR PO) Take 1 tablet by mouth daily.  . fexofenadine (ALLEGRA) 180 MG tablet Take 180 mg by mouth daily.  . fluticasone (FLONASE) 50 MCG/ACT nasal spray Place 2 sprays into both nostrils daily as needed.   . mometasone-formoterol (DULERA) 100-5 MCG/ACT AERO Take 2 puffs first thing in am and then another 2 puffs about 12 hours later.  Marland Kitchen PROAIR HFA 108 (90 BASE) MCG/ACT inhaler Inhale 2 puffs into the lungs every 4 (four) hours as needed.  . Azelastine-Fluticasone (DYMISTA) 137-50 MCG/ACT SUSP Place 1 spray into both nostrils 2 (two) times daily. (Patient not taking: Reported on 01/13/2016)  . [DISCONTINUED] levocetirizine (XYZAL) 5 MG tablet Take 1 tablet (5 mg total) by mouth every evening.  . [DISCONTINUED] montelukast (SINGULAIR) 10 MG tablet Take 1 tablet (10 mg total) by mouth at bedtime. (Patient not taking: Reported on 01/13/2016)   No facility-administered encounter medications on file as of 01/13/2016.

## 2016-01-13 NOTE — Assessment & Plan Note (Addendum)
-    N0 12/02/2015  = 71  -  Spirometry 12/02/2015  FEV1  2.78(81%) ratio 72 with fef 25-75 55%  - 12/02/2015  extensive coaching HFA effectiveness =    75% > try dulera 100 2bid  I had an extended final summary discussion with the patient reviewing all relevant studies completed to date and  lasting 15 to 20 minutes of a 25 minute visit on the following issues:    All goals of chronic asthma control met including optimal function and elimination of symptoms with minimal need for rescue therapy.  Contingencies discussed in full including contacting this office immediately if not controlling the symptoms using the rule of two's.     Each maintenance medication was reviewed in detail including most importantly the difference between maintenance and as needed and under what circumstances the prns are to be used.  Please see instructions for details which were reviewed in writing and the patient given a copy.

## 2016-01-13 NOTE — Patient Instructions (Addendum)
If doing great including exercise and not needing the rescue inhaler (RED= Proair) more than a few times a week x 3  Straight months, then your allergist can consider step down to milder inhalers  Pulmonary follow up is as needed as long as you are under the care of an allergist and satisfied with your breathing.

## 2016-02-02 ENCOUNTER — Ambulatory Visit (INDEPENDENT_AMBULATORY_CARE_PROVIDER_SITE_OTHER): Payer: BLUE CROSS/BLUE SHIELD | Admitting: Allergy and Immunology

## 2016-02-02 ENCOUNTER — Encounter: Payer: Self-pay | Admitting: Allergy and Immunology

## 2016-02-02 VITALS — BP 130/80 | HR 72 | Resp 20

## 2016-02-02 DIAGNOSIS — J4530 Mild persistent asthma, uncomplicated: Secondary | ICD-10-CM

## 2016-02-02 DIAGNOSIS — J3089 Other allergic rhinitis: Secondary | ICD-10-CM | POA: Diagnosis not present

## 2016-02-02 DIAGNOSIS — T7800XD Anaphylactic reaction due to unspecified food, subsequent encounter: Secondary | ICD-10-CM | POA: Diagnosis not present

## 2016-02-02 MED ORDER — BECLOMETHASONE DIPROPIONATE 80 MCG/ACT IN AERS: 2.0000 | INHALATION_SPRAY | Freq: Two times a day (BID) | RESPIRATORY_TRACT | Status: DC

## 2016-02-02 NOTE — Assessment & Plan Note (Signed)
   Initiate aeroallergen immunotherapy.  Medications will be decreased or discontinued as symptom reduction from immunotherapy becomes evident.  Continue Dymista nasal spray as needed.  In context of his perceived somnolence with levocetirizine, if an antihistamine is required, I have recommended fexofenadine 180 mg daily as needed.

## 2016-02-02 NOTE — Assessment & Plan Note (Signed)
   A prescription has been provided for Qvar (beclomethasone) 80 g, 2 inhalations via spacer device twice a day.  If his symptoms remain well controlled, he may decrease dose to 1 inhalation via spacer device twice a day.  Discontinue Dulera 100/5 g.  Continue albuterol HFA, 1-2 inhalations every 4-6 hours as needed and 15 minutes prior to exercise.  Subjective and objective measures of pulmonary function will be followed and the treatment plan will be adjusted accordingly.

## 2016-02-02 NOTE — Addendum Note (Signed)
Addended by: Clifton James on: 02/02/2016 05:14 PM   Modules accepted: Orders

## 2016-02-02 NOTE — Progress Notes (Signed)
Follow-up Note  RE: Scott Brandt MRN: [id number] DOB: 06/07/1961 Date of Office Visit: 02/02/2016  Primary care provider: Wilson Singer, MD Referring provider: Wilson Singer, MD  History of present illness: HPI Comments: Scott Brandt is a 55 y.o. male persistent asthma, allergic rhinitis, and food allergy who presents today for follow up. He reports that he has not initiated immunotherapy yet but is interested in doing so. He decreased Dulera 100/5 g to 2 inhalations via spacer device one time per day. Despite this decrease in medication he has not experienced asthma symptoms with exercise or in the evenings as he previously had been. He discontinued montelukast and levocetirize because of perceived somnolence. He has no nasal symptom complaints today.   Assessment and plan: Mild persistent asthma in adult without complication  A prescription has been provided for Qvar (beclomethasone) 80 g, 2 inhalations via spacer device twice a day.  If his symptoms remain well controlled, he may decrease dose to 1 inhalation via spacer device twice a day.  Discontinue Dulera 100/5 g.  Continue albuterol HFA, 1-2 inhalations every 4-6 hours as needed and 15 minutes prior to exercise.  Subjective and objective measures of pulmonary function will be followed and the treatment plan will be adjusted accordingly.  Perennial and seasonal allergic rhinitis  Initiate aeroallergen immunotherapy.  Medications will be decreased or discontinued as symptom reduction from immunotherapy becomes evident.  Continue Dymista nasal spray as needed.  In context of his perceived somnolence with levocetirizine, if an antihistamine is required, I have recommended fexofenadine 180 mg daily as needed.    Meds ordered this encounter  Medications  . beclomethasone (QVAR) 80 MCG/ACT inhaler    Sig: Inhale 2 puffs into the lungs 2 (two) times daily.    Dispense:  1 Inhaler    Refill:  5     Diagnositics: Spirometry:  Normal with an FEV1 of 89% predicted.  Please see scanned spirometry results for details.    Physical examination: Blood pressure 130/80, pulse 72, resp. rate 20.  General: Alert, interactive, in no acute distress. HEENT: TMs pearly gray, turbinates moderately edematous with clear discharge, post-pharynx mildly erythematous. Neck: Supple without lymphadenopathy. Lungs: Clear to auscultation without wheezing, rhonchi or rales. CV: Normal S1, S2 without murmurs. Skin: Warm and dry, without lesions or rashes.  The following portions of the patient's history were reviewed and updated as appropriate: allergies, current medications, past family history, past medical history, past social history, past surgical history and problem list.    Medication List       This list is accurate as of: 02/02/16  5:10 PM.  Always use your most recent med list.               aspirin 81 MG tablet  Take 81 mg by mouth daily.     Azelastine-Fluticasone 137-50 MCG/ACT Susp  Commonly known as:  DYMISTA  Place 1 spray into both nostrils 2 (two) times daily.     beclomethasone 80 MCG/ACT inhaler  Commonly known as:  QVAR  Inhale 2 puffs into the lungs 2 (two) times daily.     fexofenadine 180 MG tablet  Commonly known as:  ALLEGRA  Take 180 mg by mouth as needed.     fluticasone 50 MCG/ACT nasal spray  Commonly known as:  FLONASE  Place 2 sprays into both nostrils daily as needed.     LIPITOR PO  Take 1 tablet by mouth daily.     mometasone-formoterol  100-5 MCG/ACT Aero  Commonly known as:  DULERA  Take 2 puffs first thing in am and then another 2 puffs about 12 hours later.     PROAIR HFA 108 (90 Base) MCG/ACT inhaler  Generic drug:  albuterol  Inhale 2 puffs into the lungs every 4 (four) hours as needed.        Allergies  Allergen Reactions  . Shellfish Allergy Anaphylaxis    I appreciate the opportunity to take part in this [name]'s care. Please do  not hesitate to contact me with questions.  Sincerely,   R. Jorene Guest, MD

## 2016-02-02 NOTE — Patient Instructions (Signed)
Mild persistent asthma in adult without complication  A prescription has been provided for Qvar (beclomethasone) 80 g, 2 inhalations via spacer device twice a day.  If his symptoms remain well controlled, he may decrease dose to 1 inhalation via spacer device twice a day.  Discontinue Dulera 100/5 g.  Continue albuterol HFA, 1-2 inhalations every 4-6 hours as needed and 15 minutes prior to exercise.  Subjective and objective measures of pulmonary function will be followed and the treatment plan will be adjusted accordingly.  Perennial and seasonal allergic rhinitis  Initiate aeroallergen immunotherapy.  Medications will be decreased or discontinued as symptom reduction from immunotherapy becomes evident.  Continue Dymista nasal spray as needed.  In context of his perceived somnolence with levocetirizine, if an antihistamine is required, I have recommended fexofenadine 180 mg daily as needed.   Return in about 4 months (around 06/01/2016), or if symptoms worsen or fail to improve.

## 2016-02-10 ENCOUNTER — Ambulatory Visit (INDEPENDENT_AMBULATORY_CARE_PROVIDER_SITE_OTHER): Payer: BLUE CROSS/BLUE SHIELD

## 2016-02-10 DIAGNOSIS — J309 Allergic rhinitis, unspecified: Secondary | ICD-10-CM

## 2016-02-10 NOTE — Progress Notes (Signed)
Immunotherapy   Patient Details  Name: Scott Brandt MRN: [id number] Date of Birth: Jan 20, 1961  02/10/2016  [name] started injections for Blue 1:100,000 (grass-weed-tree-dmite and mold-cat-dog-cr) Following schedule: A  Frequency:2 times per week Epi-Pen:Epi-Pen Avaiable . No problems after 30 minute wait. Consent signed and patient instructions given.   [name] 02/10/2016, 4:31 PM

## 2016-02-25 ENCOUNTER — Ambulatory Visit (INDEPENDENT_AMBULATORY_CARE_PROVIDER_SITE_OTHER): Payer: BLUE CROSS/BLUE SHIELD | Admitting: *Deleted

## 2016-02-25 DIAGNOSIS — J309 Allergic rhinitis, unspecified: Secondary | ICD-10-CM

## 2016-02-28 ENCOUNTER — Emergency Department (HOSPITAL_COMMUNITY): Payer: BLUE CROSS/BLUE SHIELD | Admitting: Anesthesiology

## 2016-02-28 ENCOUNTER — Encounter (HOSPITAL_COMMUNITY)
Admission: EM | Disposition: A | Discharge status: Home Health | Payer: Self-pay | Source: Home / Self Care | Attending: Orthopedic Surgery

## 2016-02-28 ENCOUNTER — Inpatient Hospital Stay (HOSPITAL_COMMUNITY)
Admission: EM | Admit: 2016-02-28 | Discharge: 2016-03-02 | DRG: 482 | Disposition: A | Discharge status: Home Health | Payer: BLUE CROSS/BLUE SHIELD | Attending: Orthopedic Surgery | Admitting: Orthopedic Surgery

## 2016-02-28 ENCOUNTER — Encounter (HOSPITAL_COMMUNITY): Payer: Self-pay | Admitting: Emergency Medicine

## 2016-02-28 ENCOUNTER — Emergency Department (HOSPITAL_COMMUNITY): Payer: BLUE CROSS/BLUE SHIELD

## 2016-02-28 DIAGNOSIS — E7800 Pure hypercholesterolemia, unspecified: Secondary | ICD-10-CM | POA: Diagnosis present

## 2016-02-28 DIAGNOSIS — Z91013 Allergy to seafood: Secondary | ICD-10-CM

## 2016-02-28 DIAGNOSIS — M79652 Pain in left thigh: Secondary | ICD-10-CM | POA: Diagnosis not present

## 2016-02-28 DIAGNOSIS — S7290XA Unspecified fracture of unspecified femur, initial encounter for closed fracture: Secondary | ICD-10-CM

## 2016-02-28 DIAGNOSIS — S72302A Unspecified fracture of shaft of left femur, initial encounter for closed fracture: Principal | ICD-10-CM | POA: Diagnosis present

## 2016-02-28 DIAGNOSIS — S72145A Nondisplaced intertrochanteric fracture of left femur, initial encounter for closed fracture: Secondary | ICD-10-CM | POA: Diagnosis present

## 2016-02-28 DIAGNOSIS — Y9239 Other specified sports and athletic area as the place of occurrence of the external cause: Secondary | ICD-10-CM

## 2016-02-28 DIAGNOSIS — R52 Pain, unspecified: Secondary | ICD-10-CM

## 2016-02-28 DIAGNOSIS — S7292XA Unspecified fracture of left femur, initial encounter for closed fracture: Secondary | ICD-10-CM | POA: Diagnosis present

## 2016-02-28 DIAGNOSIS — W010XXA Fall on same level from slipping, tripping and stumbling without subsequent striking against object, initial encounter: Secondary | ICD-10-CM | POA: Diagnosis present

## 2016-02-28 DIAGNOSIS — Y9302 Activity, running: Secondary | ICD-10-CM | POA: Diagnosis present

## 2016-02-28 HISTORY — PX: FEMUR IM NAIL: SHX1597

## 2016-02-28 HISTORY — DX: Pure hypercholesterolemia, unspecified: E78.00

## 2016-02-28 LAB — PROTIME-INR
INR: 1.03 (ref 0.00–1.49)
Prothrombin Time: 13.7 seconds (ref 11.6–15.2)

## 2016-02-28 LAB — BASIC METABOLIC PANEL
ANION GAP: 12 (ref 5–15)
BUN: 14 mg/dL (ref 6–20)
CALCIUM: 9.6 mg/dL (ref 8.9–10.3)
CO2: 23 mmol/L (ref 22–32)
Chloride: 107 mmol/L (ref 101–111)
Creatinine, Ser: 1.32 mg/dL — ABNORMAL HIGH (ref 0.61–1.24)
GFR calc Af Amer: >60 mL/min (ref >60)
GFR calc non Af Amer: 60 mL/min — ABNORMAL LOW (ref >60)
GLUCOSE: 111 mg/dL — AB (ref 65–99)
Potassium: 4.4 mmol/L (ref 3.5–5.1)
Sodium: 142 mmol/L (ref 135–145)

## 2016-02-28 LAB — CBC WITH DIFFERENTIAL/PLATELET
BASOS ABS: 0 10*3/uL (ref 0.0–0.1)
Basophils Relative: 1 %
EOS ABS: 0.1 10*3/uL (ref 0.0–0.7)
EOS PCT: 1 %
HEMATOCRIT: 41.5 % (ref 39.0–52.0)
Hemoglobin: 14 g/dL (ref 13.0–17.0)
Lymphocytes Relative: 32 %
Lymphs Abs: 2.3 10*3/uL (ref 0.7–4.0)
MCH: 30.6 pg (ref 26.0–34.0)
MCHC: 33.7 g/dL (ref 30.0–36.0)
MCV: 90.8 fL (ref 78.0–100.0)
MONO ABS: 0.3 10*3/uL (ref 0.1–1.0)
Monocytes Relative: 4 %
Neutro Abs: 4.5 10*3/uL (ref 1.7–7.7)
Neutrophils Relative %: 62 %
Platelets: 211 10*3/uL (ref 150–400)
RBC: 4.57 MIL/uL (ref 4.22–5.81)
RDW: 13.3 % (ref 11.5–15.5)
WBC: 7.3 10*3/uL (ref 4.0–10.5)

## 2016-02-28 SURGERY — INSERTION, INTRAMEDULLARY ROD, FEMUR
Anesthesia: General | Site: Leg Upper | Laterality: Left

## 2016-02-28 MED ORDER — DIAZEPAM 5 MG/ML IJ SOLN
INTRAMUSCULAR | Status: AC
  Filled 2016-02-28: qty 2

## 2016-02-28 MED ORDER — DIAZEPAM 5 MG/ML IJ SOLN
5.0000 mg | Freq: Once | INTRAMUSCULAR | Status: AC
  Administered 2016-02-28: 5 mg via INTRAVENOUS

## 2016-02-28 MED ORDER — FENTANYL CITRATE (PF) 100 MCG/2ML IJ SOLN
INTRAMUSCULAR | Status: DC | PRN
  Administered 2016-02-28: 150 ug via INTRAVENOUS

## 2016-02-28 MED ORDER — LACTATED RINGERS IV SOLN
INTRAVENOUS | Status: DC | PRN
  Administered 2016-02-28: 22:00:00 via INTRAVENOUS

## 2016-02-28 MED ORDER — PROPOFOL 10 MG/ML IV BOLUS
INTRAVENOUS | Status: DC | PRN
  Administered 2016-02-28: 200 mg via INTRAVENOUS

## 2016-02-28 MED ORDER — FENTANYL CITRATE (PF) 250 MCG/5ML IJ SOLN
INTRAMUSCULAR | Status: AC
  Filled 2016-02-28: qty 5

## 2016-02-28 MED ORDER — HYDROMORPHONE HCL 1 MG/ML IJ SOLN
1.0000 mg | Freq: Once | INTRAMUSCULAR | Status: AC
  Administered 2016-02-28: 1 mg via INTRAVENOUS

## 2016-02-28 MED ORDER — EPHEDRINE SULFATE 50 MG/ML IJ SOLN
INTRAMUSCULAR | Status: DC | PRN
  Administered 2016-02-28: 10 mg via INTRAVENOUS

## 2016-02-28 MED ORDER — LIDOCAINE HCL (CARDIAC) 20 MG/ML IV SOLN
INTRAVENOUS | Status: DC | PRN
  Administered 2016-02-28: 100 mg via INTRAVENOUS

## 2016-02-28 MED ORDER — PHENYLEPHRINE HCL 10 MG/ML IJ SOLN
INTRAMUSCULAR | Status: DC | PRN
  Administered 2016-02-28: 80 ug via INTRAVENOUS

## 2016-02-28 MED ORDER — CEFAZOLIN SODIUM-DEXTROSE 2-3 GM-% IV SOLR
INTRAVENOUS | Status: DC | PRN
  Administered 2016-02-28: 2 g via INTRAVENOUS

## 2016-02-28 MED ORDER — ONDANSETRON HCL 4 MG/2ML IJ SOLN
INTRAMUSCULAR | Status: DC | PRN
  Administered 2016-02-28: 4 mg via INTRAVENOUS

## 2016-02-28 MED ORDER — MIDAZOLAM HCL 2 MG/2ML IJ SOLN
INTRAMUSCULAR | Status: AC
  Filled 2016-02-28: qty 2

## 2016-02-28 MED ORDER — 0.9 % SODIUM CHLORIDE (POUR BTL) OPTIME
TOPICAL | Status: DC | PRN
  Administered 2016-02-28: 1000 mL

## 2016-02-28 MED ORDER — SUCCINYLCHOLINE CHLORIDE 20 MG/ML IJ SOLN
INTRAMUSCULAR | Status: DC | PRN
  Administered 2016-02-28: 100 mg via INTRAVENOUS

## 2016-02-28 MED ORDER — ROCURONIUM BROMIDE 100 MG/10ML IV SOLN
INTRAVENOUS | Status: DC | PRN
  Administered 2016-02-28: 10 mg via INTRAVENOUS

## 2016-02-28 MED ORDER — HYDROMORPHONE HCL 1 MG/ML IJ SOLN
1.0000 mg | Freq: Once | INTRAMUSCULAR | Status: AC
  Administered 2016-02-28: 1 mg via INTRAVENOUS
  Filled 2016-02-28: qty 1

## 2016-02-28 MED ORDER — HYDROMORPHONE HCL 1 MG/ML IJ SOLN: 1.0000 mg | Freq: Once | INTRAMUSCULAR | Status: DC

## 2016-02-28 MED ORDER — PROPOFOL 10 MG/ML IV BOLUS
INTRAVENOUS | Status: AC
  Filled 2016-02-28: qty 20

## 2016-02-28 MED ORDER — SODIUM CHLORIDE 0.9 % IV SOLN
Freq: Once | INTRAVENOUS | Status: AC
  Administered 2016-02-28: 20:00:00 via INTRAVENOUS

## 2016-02-28 SURGICAL SUPPLY — 52 items
BIT DRILL 4.3MMS DISTAL GRDTED (BIT) ×1 IMPLANT
BNDG GAUZE ELAST 4 BULKY (GAUZE/BANDAGES/DRESSINGS) ×3 IMPLANT
CORTICAL BONE SCR 5.0MM X 46MM (Screw) ×3 IMPLANT
COVER MAYO STAND STRL (DRAPES) ×6 IMPLANT
COVER PERINEAL POST (MISCELLANEOUS) ×3 IMPLANT
DRAPE INCISE 23X17 IOBAN STRL (DRAPES) ×2
DRAPE INCISE IOBAN 23X17 STRL (DRAPES) ×1 IMPLANT
DRAPE STERI IOBAN 125X83 (DRAPES) IMPLANT
DRAPE SURG 17X23 STRL (DRAPES) ×3 IMPLANT
DRAPE SURG ISO 125X83 STRL (DRAPES) ×3 IMPLANT
DRILL 4.3MMS DISTAL GRADUATED (BIT) ×3
DRSG ADAPTIC 3X8 NADH LF (GAUZE/BANDAGES/DRESSINGS) ×3 IMPLANT
DRSG MEPILEX BORDER 4X12 (GAUZE/BANDAGES/DRESSINGS) ×3 IMPLANT
DRSG MEPILEX BORDER 4X4 (GAUZE/BANDAGES/DRESSINGS) ×3 IMPLANT
DRSG MEPILEX BORDER 4X8 (GAUZE/BANDAGES/DRESSINGS) IMPLANT
DURAPREP 26ML APPLICATOR (WOUND CARE) IMPLANT
ELECT REM PT RETURN 9FT ADLT (ELECTROSURGICAL) ×3
ELECTRODE REM PT RTRN 9FT ADLT (ELECTROSURGICAL) ×1 IMPLANT
EVACUATOR 1/8 PVC DRAIN (DRAIN) IMPLANT
GAUZE SPONGE 4X4 12PLY STRL (GAUZE/BANDAGES/DRESSINGS) ×3 IMPLANT
GLOVE BIOGEL PI IND STRL 7.5 (GLOVE) ×1 IMPLANT
GLOVE BIOGEL PI INDICATOR 7.5 (GLOVE) ×2
GLOVE ORTHO TXT STRL SZ7.5 (GLOVE) ×3 IMPLANT
GOWN STRL REUS W/ TWL LRG LVL3 (GOWN DISPOSABLE) ×1 IMPLANT
GOWN STRL REUS W/TWL LRG LVL3 (GOWN DISPOSABLE) ×2
GUIDEWIRE BALL NOSE 80CM (WIRE) ×3 IMPLANT
HFN LH 130 DEG 9MM X 420MM (Nail) ×3 IMPLANT
HIP FR NAIL LAG SCREW 10.5X110 (Orthopedic Implant) ×6 IMPLANT
IMMOBILIZER KNEE 24 THIGH 36 (MISCELLANEOUS) ×1 IMPLANT
IMMOBILIZER KNEE 24 UNIV (MISCELLANEOUS) ×3
KIT BASIN OR (CUSTOM PROCEDURE TRAY) ×3 IMPLANT
KIT ROOM TURNOVER OR (KITS) ×3 IMPLANT
LINER BOOT UNIVERSAL DISP (MISCELLANEOUS) ×3 IMPLANT
LIQUID BAND (GAUZE/BANDAGES/DRESSINGS) ×6 IMPLANT
NS IRRIG 1000ML POUR BTL (IV SOLUTION) ×3 IMPLANT
PACK GENERAL/GYN (CUSTOM PROCEDURE TRAY) ×3 IMPLANT
PAD ARMBOARD 7.5X6 YLW CONV (MISCELLANEOUS) ×6 IMPLANT
PIN GUIDE 3.2 903003004 (MISCELLANEOUS) ×3 IMPLANT
SCREW BONE CORTICAL 5.0X42 (Screw) ×3 IMPLANT
SCREW CORTICL BON 5.0MM X 46MM (Screw) ×1 IMPLANT
SCREW LAG 10.5MMX105MM HFN (Screw) ×3 IMPLANT
SCREW LAG HIP FR NAIL 10.5X110 (Orthopedic Implant) ×2 IMPLANT
STAPLER VISISTAT 35W (STAPLE) ×3 IMPLANT
SUT MNCRL AB 3-0 PS2 18 (SUTURE) ×3 IMPLANT
SUT VIC AB 0 CT1 27 (SUTURE) ×2
SUT VIC AB 0 CT1 27XBRD ANBCTR (SUTURE) ×1 IMPLANT
SUT VIC AB 1 CT1 27 (SUTURE) ×2
SUT VIC AB 1 CT1 27XBRD ANBCTR (SUTURE) ×1 IMPLANT
SUT VIC AB 2-0 CT1 27 (SUTURE) ×4
SUT VIC AB 2-0 CT1 TAPERPNT 27 (SUTURE) ×2 IMPLANT
TOWEL OR 17X24 6PK STRL BLUE (TOWEL DISPOSABLE) ×3 IMPLANT
TOWEL OR 17X26 10 PK STRL BLUE (TOWEL DISPOSABLE) ×3 IMPLANT

## 2016-02-28 NOTE — Progress Notes (Signed)
Orthopedic Tech Progress Note Patient Details:  Scott Brandt L Busbee 14-Feb-1961 [id number]  Patient ID: Scott PullingEric L Francesconi, male   DOB: 14-Feb-1961, 55 y.o.   MRN: [id number] Trauma page  Trinna PostMartinez, Oree Hislop J 02/28/2016, 8:28 PM

## 2016-02-28 NOTE — H&P (Signed)
Scott Brandt is an 55 y.o. male.    Chief Complaint:  Left thigh pain   HPI: Pt is a 55 y.o. male complaining of left thigh pain, inability to bear weight after stumbling while trying to run during exercise program at [facility].  Felt pop in thigh as he was trying to recover balance.  PCP:  No primary care provider on file.  D/C Plans: To be determined following appropriate treatment plan  PMH: Past Medical History  Diagnosis Date  . Hypercholesteremia   . Asthma     PSH: History reviewed. No pertinent past surgical history.  Social History:  reports that he has never smoked. He does not have any smokeless tobacco history on file. He reports that he does not drink alcohol. His drug history is not on file.  Allergies:  Allergies  Allergen Reactions  . Shellfish Allergy Anaphylaxis    Medications:  (Not in a hospital admission)  Results for orders placed or performed during the hospital encounter of 02/28/16 (from the past 48 hour(s))  CBC with Differential     Status: None   Collection Time: 02/28/16  8:01 PM  Result Value Ref Range   WBC 7.3 4.0 - 10.5 K/uL   RBC 4.57 4.22 - 5.81 MIL/uL   Hemoglobin 14.0 13.0 - 17.0 g/dL   HCT 41.5 39.0 - 52.0 %   MCV 90.8 78.0 - 100.0 fL   MCH 30.6 26.0 - 34.0 pg   MCHC 33.7 30.0 - 36.0 g/dL   RDW 13.3 11.5 - 15.5 %   Platelets 211 150 - 400 K/uL   Neutrophils Relative % 62 %   Neutro Abs 4.5 1.7 - 7.7 K/uL   Lymphocytes Relative 32 %   Lymphs Abs 2.3 0.7 - 4.0 K/uL   Monocytes Relative 4 %   Monocytes Absolute 0.3 0.1 - 1.0 K/uL   Eosinophils Relative 1 %   Eosinophils Absolute 0.1 0.0 - 0.7 K/uL   Basophils Relative 1 %   Basophils Absolute 0.0 0.0 - 0.1 K/uL  Basic metabolic panel     Status: Abnormal   Collection Time: 02/28/16  8:01 PM  Result Value Ref Range   Sodium 142 135 - 145 mmol/L   Potassium 4.4 3.5 - 5.1 mmol/L   Chloride 107 101 - 111 mmol/L   CO2 23 22 - 32 mmol/L   Glucose, Bld 111 (H) 65 - 99 mg/dL   BUN 14 6 - 20 mg/dL   Creatinine, Ser 1.32 (H) 0.61 - 1.24 mg/dL   Calcium 9.6 8.9 - 10.3 mg/dL   GFR calc non Af Amer 60 (L) >60 mL/min   GFR calc Af Amer >60 >60 mL/min    Comment: (NOTE) The eGFR has been calculated using the CKD EPI equation. This calculation has not been validated in all clinical situations. eGFR's persistently <60 mL/min signify possible Chronic Kidney Disease.    Anion gap 12 5 - 15  Protime-INR     Status: None   Collection Time: 02/28/16  9:43 PM  Result Value Ref Range   Prothrombin Time 13.7 11.6 - 15.2 seconds   INR 1.03 0.00 - 1.49   Dg Pelvis 1-2 Views  02/28/2016  CLINICAL DATA:  Left leg pain after landing wrong playing basketball hearing a pop. EXAM: PELVIS - 1-2 VIEW COMPARISON:  None. FINDINGS: Minimally displaced and comminuted intertrochanteric left hip fracture, with involvement of the greater trochanter. No extension to the femoral neck. Femoral head remains seated. No additional  fracture of the bony pelvis allowing for patient rotation. Overlying external artifact over the right proximal femur. IMPRESSION: Minimally displaced intertrochanteric left proximal femur fracture. Electronically Signed   By: Jeb Levering M.D.   On: 02/28/2016 20:30   Dg Femur Min 2 Views Left  02/28/2016  CLINICAL DATA:  Left leg pain after landing wrong playing basketball and heard a pop. Severe pain in the distal lower leg. EXAM: LEFT FEMUR 2 VIEWS COMPARISON:  Concurrently performed pelvis radiograph. FINDINGS: Minimally displaced intertrochanteric proximal left femur fracture. Additionally there is a displaced minimally angulated angulated oblique fracture of the distal femoral shaft. One shaft with posterior displacement of distal fragment with minimal osseous overriding. No extension to the knee joint. IMPRESSION: 1. Displaced minimally angulated distal femoral shaft fracture. 2. Minimally displaced intertrochanteric fracture. Electronically Signed   By: Jeb Levering  M.D.   On: 02/28/2016 20:32    ROS: Review of Systems - Negative except that reported in HPI as otherwise healthy  Physican Exam: Blood pressure 148/86, pulse 70, temperature 97.2 F (36.2 C), temperature source Oral, resp. rate 17, height 6' (1.829 m), weight 98.431 kg (217 lb), SpO2 97 %. .physicalexam Awake alert, uncomfortable due to pain in left leg  Left leg short, NVI Heart regular Chest clear Abdomen soft No upper extremity or right lower extremity findings  Assessment/Plan Assessment: Closed left distal third femoral shaft fracture with associated ipsilateral nondisplaced intertrochanteric proximal femur fracture, left   Plan: Patient will undergo a ORIF of left femoral shaft and ipsilateral intertrochanteric femur fracture. Risks benefits and expectation were discussed with the patient. Patient understand risks, benefits and expectation and wishes to proceed.  Consent signed by his wife in attendance due to medications administered.  Plan on 2-3 hospital stay with PT eval, pain control   Pietro Cassis. Alvan Dame, MD  02/28/2016, 10:20 PM

## 2016-02-28 NOTE — ED Provider Notes (Signed)
CSN: [id number]     Arrival date & time    History   First MD Initiated Contact with Patient 02/28/16 1951     Chief Complaint  Patient presents with  . Fall  . Leg Pain   Patient is a 55 y.o. male presenting with leg pain. The history is provided by the patient and the EMS personnel. No language interpreter was used.  Leg Pain Location:  Leg Time since incident:  30 minutes Injury: yes   Mechanism of injury: fall   Pain details:    Quality:  Throbbing   Radiates to:  Does not radiate   Severity:  Severe   Onset quality:  Sudden   Duration:  1 hour   Timing:  Constant   Progression:  Unchanged Chronicity:  New Dislocation: no   Relieved by:  Nothing Worsened by:  Nothing tried Associated symptoms: decreased ROM and swelling   Associated symptoms: no back pain, no fever and no neck pain   Risk factors: no known bone disorder     Past Medical History  Diagnosis Date  . Hypercholesteremia   . Asthma    History reviewed. No pertinent past surgical history. History reviewed. No pertinent family history. Social History  Substance Use Topics  . Smoking status: Never Smoker   . Smokeless tobacco: None  . Alcohol Use: No    Review of Systems  Constitutional: Negative for fever, chills, activity change and appetite change.  HENT: Negative for congestion, dental problem, ear pain, facial swelling, hearing loss, rhinorrhea, sneezing, sore throat, trouble swallowing and voice change.   Eyes: Negative for photophobia, pain, redness and visual disturbance.  Respiratory: Negative for apnea, cough, chest tightness, shortness of breath, wheezing and stridor.   Cardiovascular: Negative for chest pain, palpitations and leg swelling.  Gastrointestinal: Negative for nausea, vomiting, abdominal pain, diarrhea, constipation, blood in stool and abdominal distention.  Endocrine: Negative for polydipsia and polyuria.  Genitourinary: Negative for frequency, hematuria, flank pain, decreased  urine volume and difficulty urinating.  Musculoskeletal: Positive for gait problem. Negative for back pain, joint swelling, neck pain and neck stiffness.  Skin: Negative for rash and wound.  Allergic/Immunologic: Negative for immunocompromised state.  Neurological: Negative for dizziness, syncope, facial asymmetry, speech difficulty, weakness, light-headedness, numbness and headaches.  Hematological: Negative for adenopathy.  Psychiatric/Behavioral: Negative for suicidal ideas, behavioral problems, confusion, sleep disturbance and agitation. The patient is not nervous/anxious.   All other systems reviewed and are negative.     Allergies  Shellfish allergy  Home Medications   Prior to Admission medications   Not on File   BP 148/86 mmHg  Pulse 70  Temp(Src) 97.2 F (36.2 C) (Oral)  Resp 17  Ht 6' (1.829 m)  Wt 98.431 kg  BMI 29.42 kg/m2  SpO2 97% Physical Exam  Constitutional: He is oriented to person, place, and time. He appears well-developed and well-nourished. No distress.  HENT:  Head: Normocephalic and atraumatic.  Right Ear: External ear normal.  Left Ear: External ear normal.  Eyes: Pupils are equal, round, and reactive to light. Right eye exhibits no discharge. Left eye exhibits no discharge.  Neck: Normal range of motion. No JVD present. No tracheal deviation present.  Cardiovascular: Normal rate, regular rhythm, normal heart sounds and intact distal pulses.  Exam reveals no friction rub.   No murmur heard. Pulmonary/Chest: Effort normal and breath sounds normal. No stridor. No respiratory distress. He has no wheezes.  Abdominal: Soft. Bowel sounds are normal. He exhibits no  distension. There is no rebound and no guarding.  Musculoskeletal: Normal range of motion. He exhibits no edema.       Left upper leg: He exhibits tenderness and deformity.  Lymphadenopathy:    He has no cervical adenopathy.  Neurological: He is alert and oriented to person, place, and time.  No cranial nerve deficit. Coordination normal.  Skin: Skin is warm and dry. No rash noted. No pallor.  Psychiatric: He has a normal mood and affect. His behavior is normal. Judgment and thought content normal.  Nursing note and vitals reviewed.   ED Course  Procedures (including critical care time) Labs Review Labs Reviewed  BASIC METABOLIC PANEL - Abnormal; Notable for the following:    Glucose, Bld 111 (*)    Creatinine, Ser 1.32 (*)    GFR calc non Af Amer 60 (*)    All other components within normal limits  CBC WITH DIFFERENTIAL/PLATELET  PROTIME-INR    Imaging Review Dg Pelvis 1-2 Views  02/28/2016  CLINICAL DATA:  Left leg pain after landing wrong playing basketball hearing a pop. EXAM: PELVIS - 1-2 VIEW COMPARISON:  None. FINDINGS: Minimally displaced and comminuted intertrochanteric left hip fracture, with involvement of the greater trochanter. No extension to the femoral neck. Femoral head remains seated. No additional fracture of the bony pelvis allowing for patient rotation. Overlying external artifact over the right proximal femur. IMPRESSION: Minimally displaced intertrochanteric left proximal femur fracture. Electronically Signed   By: Rubye Oaks M.D.   On: 02/28/2016 20:30   Dg Femur Min 2 Views Left  02/28/2016  CLINICAL DATA:  Left leg pain after landing wrong playing basketball and heard a pop. Severe pain in the distal lower leg. EXAM: LEFT FEMUR 2 VIEWS COMPARISON:  Concurrently performed pelvis radiograph. FINDINGS: Minimally displaced intertrochanteric proximal left femur fracture. Additionally there is a displaced minimally angulated angulated oblique fracture of the distal femoral shaft. One shaft with posterior displacement of distal fragment with minimal osseous overriding. No extension to the knee joint. IMPRESSION: 1. Displaced minimally angulated distal femoral shaft fracture. 2. Minimally displaced intertrochanteric fracture. Electronically Signed   By:  Rubye Oaks M.D.   On: 02/28/2016 20:32   I have personally reviewed and evaluated these images and lab results as part of my medical decision-making.   EKG Interpretation None      MDM   Final diagnoses:  Closed fracture of left femur, unspecified fracture morphology, unspecified portion of femur, initial encounter [facility])    Patient tripped and stepped wrong. Presents via EMS as trauma activation due to suspected femur fracture. No other injuries noted.  Vital signs stable upon arrival. Patient with deformity of the left femur. Norvasc only intact with good distal pulses.  X-ray confirms midshaft left femur fracture. Consult orthopedics if the patient the OR for definitive treatment.  Pain control with dilaudid, Valium in ED. Continue to be neurovascularly intact. Brief desaturation following pain medication improvement submental oxygen.  Discussed with my attending, Dr. Clydene Pugh.      Dan Humphreys, MD 02/28/16 1610  Lyndal Pulley, MD 02/29/16 [phone]

## 2016-02-28 NOTE — Anesthesia Preprocedure Evaluation (Signed)
Anesthesia Evaluation  Patient identified by MRN, date of birth, ID band Patient awake    Reviewed: Allergy & Precautions, H&P , NPO status , Patient's Chart, lab work & pertinent test results  History of Anesthesia Complications Negative for: history of anesthetic complications  Airway Mallampati: II  TM Distance: >3 FB Neck ROM: full    Dental no notable dental hx.    Pulmonary asthma ,    Pulmonary exam normal breath sounds clear to auscultation       Cardiovascular negative cardio ROS Normal cardiovascular exam Rhythm:regular Rate:Normal     Neuro/Psych negative neurological ROS     GI/Hepatic negative GI ROS, Neg liver ROS,   Endo/Other  negative endocrine ROS  Renal/GU negative Renal ROS     Musculoskeletal   Abdominal   Peds  Hematology negative hematology ROS (+)   Anesthesia Other Findings Femur fracture while exercising  Reproductive/Obstetrics negative OB ROS                             Anesthesia Physical Anesthesia Plan  ASA: II  Anesthesia Plan: General   Post-op Pain Management:    Induction: Intravenous, Rapid sequence and Cricoid pressure planned  Airway Management Planned: Oral ETT  Additional Equipment:   Intra-op Plan:   Post-operative Plan: Extubation in OR  Informed Consent: I have reviewed the patients History and Physical, chart, labs and discussed the procedure including the risks, benefits and alternatives for the proposed anesthesia with the patient or authorized representative who has indicated his/her understanding and acceptance.   Dental Advisory Given  Plan Discussed with: Anesthesiologist, CRNA and Surgeon  Anesthesia Plan Comments:         Anesthesia Quick Evaluation

## 2016-02-28 NOTE — Progress Notes (Signed)
Chaplain was paged to level 2 femur break. Chaplain helped get the name of the pt and escorted pts spouse back to the bedside and provided nourishment to the wife.    02/28/16 2000  Clinical Encounter Type  Visited With Patient and family together  Visit Type Spiritual support  Referral From Care management  Spiritual Encounters  Spiritual Needs Emotional  Stress Factors  Patient Stress Factors Health changes  Family Stress Factors Health changes

## 2016-02-28 NOTE — Brief Op Note (Signed)
02/28/2016  10:30 PM  PATIENT:  Scott Brandt  55 y.o. male  PRE-OPERATIVE DIAGNOSIS:  left femoral shaft and ipsilateral intertrochanteric proximal femur fracture  POST-OPERATIVE DIAGNOSIS:  left femoral shaft and ipsilateral intertrochanteric proximal femur fracture  PROCEDURE:  Procedure(s): INTRAMEDULLARY (IM) NAIL FEMORAL (Left)  SURGEON:  Surgeon(s) and Role:    * Durene RomansMatthew Anik Wesch, MD - Primary  PHYSICIAN ASSISTANT: None  ANESTHESIA:   general  EBL:  150cc  BLOOD ADMINISTERED:none  DRAINS: none   LOCAL MEDICATIONS USED:  NONE  SPECIMEN:  No Specimen  DISPOSITION OF SPECIMEN:  N/A  COUNTS:  YES  TOURNIQUET:  * No tourniquets in log *  DICTATION: .Other Dictation: Dictation Number [id number]  PLAN OF CARE: Admit to inpatient   PATIENT DISPOSITION:  PACU - hemodynamically stable.   Delay start of Pharmacological VTE agent (>24hrs) due to surgical blood loss or risk of bleeding: no

## 2016-02-28 NOTE — Anesthesia Procedure Notes (Signed)
Procedure Name: Intubation Date/Time: 02/28/2016 10:32 PM Performed by: Arlice ColtMANESS, Kiril Hippe B Pre-anesthesia Checklist: Patient identified, Emergency Drugs available, Suction available, Patient being monitored and Timeout performed Patient Re-evaluated:Patient Re-evaluated prior to inductionOxygen Delivery Method: Circle system utilized Preoxygenation: Pre-oxygenation with 100% oxygen Intubation Type: IV induction, Rapid sequence and Cricoid Pressure applied Laryngoscope Size: Mac and 3 Grade View: Grade I Tube type: Oral Tube size: 7.5 mm Number of attempts: 1 Airway Equipment and Method: Stylet Placement Confirmation: ETT inserted through vocal cords under direct vision,  positive ETCO2 and breath sounds checked- equal and bilateral Secured at: 22 cm Tube secured with: Tape Dental Injury: Teeth and Oropharynx as per pre-operative assessment

## 2016-02-28 NOTE — ED Notes (Signed)
MD at bedside. 

## 2016-02-28 NOTE — ED Notes (Signed)
Pt [place] via EMS from basketball court, was doing "boot camp" exercise routine, jumped from ground level and landed "wrong, heard a pop." Pt arrives on scoop board, EMS traction in place. CMS intact, pulses present. Pt received fentanyl PTA.

## 2016-02-28 NOTE — ED Notes (Signed)
Pt escorted to OR by this RN and bedside report given to [place], Charity fundraiserN. All belongings brought up with pt, pt denies leaving belongings in ED.

## 2016-02-29 ENCOUNTER — Inpatient Hospital Stay (HOSPITAL_COMMUNITY): Payer: BLUE CROSS/BLUE SHIELD

## 2016-02-29 ENCOUNTER — Encounter (HOSPITAL_COMMUNITY): Payer: Self-pay | Admitting: Orthopedic Surgery

## 2016-02-29 DIAGNOSIS — S72145A Nondisplaced intertrochanteric fracture of left femur, initial encounter for closed fracture: Secondary | ICD-10-CM | POA: Diagnosis present

## 2016-02-29 DIAGNOSIS — Y9239 Other specified sports and athletic area as the place of occurrence of the external cause: Secondary | ICD-10-CM | POA: Diagnosis not present

## 2016-02-29 DIAGNOSIS — M79652 Pain in left thigh: Secondary | ICD-10-CM | POA: Diagnosis present

## 2016-02-29 DIAGNOSIS — S7292XA Unspecified fracture of left femur, initial encounter for closed fracture: Secondary | ICD-10-CM | POA: Diagnosis present

## 2016-02-29 DIAGNOSIS — S72302A Unspecified fracture of shaft of left femur, initial encounter for closed fracture: Secondary | ICD-10-CM | POA: Diagnosis present

## 2016-02-29 DIAGNOSIS — Z91013 Allergy to seafood: Secondary | ICD-10-CM | POA: Diagnosis not present

## 2016-02-29 DIAGNOSIS — W010XXA Fall on same level from slipping, tripping and stumbling without subsequent striking against object, initial encounter: Secondary | ICD-10-CM | POA: Diagnosis present

## 2016-02-29 DIAGNOSIS — Y9302 Activity, running: Secondary | ICD-10-CM | POA: Diagnosis present

## 2016-02-29 DIAGNOSIS — E7800 Pure hypercholesterolemia, unspecified: Secondary | ICD-10-CM | POA: Diagnosis present

## 2016-02-29 LAB — BASIC METABOLIC PANEL
ANION GAP: 11 (ref 5–15)
BUN: 10 mg/dL (ref 6–20)
CHLORIDE: 105 mmol/L (ref 101–111)
CO2: 24 mmol/L (ref 22–32)
Calcium: 8.6 mg/dL — ABNORMAL LOW (ref 8.9–10.3)
Creatinine, Ser: 1.22 mg/dL (ref 0.61–1.24)
GFR calc non Af Amer: >60 mL/min (ref >60)
Glucose, Bld: 134 mg/dL — ABNORMAL HIGH (ref 65–99)
POTASSIUM: 4.5 mmol/L (ref 3.5–5.1)
SODIUM: 140 mmol/L (ref 135–145)

## 2016-02-29 LAB — CBC
HCT: 38.3 % — ABNORMAL LOW (ref 39.0–52.0)
HEMOGLOBIN: 12.9 g/dL — AB (ref 13.0–17.0)
MCH: 30.6 pg (ref 26.0–34.0)
MCHC: 33.7 g/dL (ref 30.0–36.0)
MCV: 90.8 fL (ref 78.0–100.0)
Platelets: 224 10*3/uL (ref 150–400)
RBC: 4.22 MIL/uL (ref 4.22–5.81)
RDW: 13.5 % (ref 11.5–15.5)
WBC: 8.8 10*3/uL (ref 4.0–10.5)

## 2016-02-29 MED ORDER — ONDANSETRON HCL 4 MG PO TABS: 4.0000 mg | ORAL_TABLET | Freq: Four times a day (QID) | ORAL | Status: DC | PRN

## 2016-02-29 MED ORDER — HYDROMORPHONE HCL 1 MG/ML IJ SOLN
INTRAMUSCULAR | Status: AC
  Filled 2016-02-29: qty 1

## 2016-02-29 MED ORDER — METOCLOPRAMIDE HCL 5 MG/ML IJ SOLN: 5.0000 mg | Freq: Three times a day (TID) | INTRAMUSCULAR | Status: DC | PRN

## 2016-02-29 MED ORDER — METHOCARBAMOL 1000 MG/10ML IJ SOLN
500.0000 mg | Freq: Four times a day (QID) | INTRAVENOUS | Status: DC | PRN
  Filled 2016-02-29: qty 5

## 2016-02-29 MED ORDER — PROMETHAZINE HCL 25 MG/ML IJ SOLN: 6.2500 mg | INTRAMUSCULAR | Status: DC | PRN

## 2016-02-29 MED ORDER — SODIUM CHLORIDE 0.9 % IV SOLN
INTRAVENOUS | Status: DC
  Administered 2016-02-29: 100 mL/h via INTRAVENOUS
  Administered 2016-02-29: 22:00:00 via INTRAVENOUS

## 2016-02-29 MED ORDER — HYDROMORPHONE HCL 1 MG/ML IJ SOLN
0.2500 mg | INTRAMUSCULAR | Status: DC | PRN
  Administered 2016-02-29 (×3): 0.5 mg via INTRAVENOUS

## 2016-02-29 MED ORDER — POLYETHYLENE GLYCOL 3350 17 G PO PACK: 17.0000 g | PACK | Freq: Every day | ORAL | Status: DC | PRN

## 2016-02-29 MED ORDER — LABETALOL HCL 5 MG/ML IV SOLN
INTRAVENOUS | Status: AC
  Administered 2016-02-29: 5 mg via INTRAVENOUS
  Filled 2016-02-29: qty 4

## 2016-02-29 MED ORDER — DOCUSATE SODIUM 100 MG PO CAPS
100.0000 mg | ORAL_CAPSULE | Freq: Two times a day (BID) | ORAL | Status: DC
  Administered 2016-02-29 – 2016-03-02 (×5): 100 mg via ORAL
  Filled 2016-02-29 (×5): qty 1

## 2016-02-29 MED ORDER — OXYCODONE HCL 5 MG PO TABS: 5.0000 mg | ORAL_TABLET | Freq: Once | ORAL | Status: DC | PRN

## 2016-02-29 MED ORDER — ONDANSETRON HCL 4 MG/2ML IJ SOLN: 4.0000 mg | Freq: Four times a day (QID) | INTRAMUSCULAR | Status: DC | PRN

## 2016-02-29 MED ORDER — ASPIRIN EC 325 MG PO TBEC
325.0000 mg | DELAYED_RELEASE_TABLET | Freq: Two times a day (BID) | ORAL | Status: DC
  Administered 2016-03-01 – 2016-03-02 (×3): 325 mg via ORAL
  Filled 2016-02-29 (×4): qty 1

## 2016-02-29 MED ORDER — METHOCARBAMOL 500 MG PO TABS
500.0000 mg | ORAL_TABLET | Freq: Four times a day (QID) | ORAL | Status: DC | PRN
  Administered 2016-02-29 – 2016-03-02 (×5): 500 mg via ORAL
  Filled 2016-02-29 (×5): qty 1

## 2016-02-29 MED ORDER — LABETALOL HCL 5 MG/ML IV SOLN: 5.0000 mg | Freq: Once | INTRAVENOUS | Status: DC

## 2016-02-29 MED ORDER — HYDROCODONE-ACETAMINOPHEN 5-325 MG PO TABS: 1.0000 | ORAL_TABLET | Freq: Four times a day (QID) | ORAL | Status: DC | PRN

## 2016-02-29 MED ORDER — ASPIRIN 325 MG PO TBEC: 325.0000 mg | DELAYED_RELEASE_TABLET | Freq: Two times a day (BID) | ORAL | Status: AC

## 2016-02-29 MED ORDER — MAGNESIUM CITRATE PO SOLN: 1.0000 | Freq: Once | ORAL | Status: DC | PRN

## 2016-02-29 MED ORDER — CEFAZOLIN SODIUM 1-5 GM-% IV SOLN
1.0000 g | Freq: Four times a day (QID) | INTRAVENOUS | Status: AC
  Administered 2016-02-29 (×3): 1 g via INTRAVENOUS
  Filled 2016-02-29 (×3): qty 50

## 2016-02-29 MED ORDER — PHENOL 1.4 % MT LIQD: 1.0000 | OROMUCOSAL | Status: DC | PRN

## 2016-02-29 MED ORDER — OXYCODONE HCL 5 MG/5ML PO SOLN: 5.0000 mg | Freq: Once | ORAL | Status: DC | PRN

## 2016-02-29 MED ORDER — DOCUSATE SODIUM 100 MG PO CAPS: 100.0000 mg | ORAL_CAPSULE | Freq: Two times a day (BID) | ORAL | Status: DC

## 2016-02-29 MED ORDER — POLYETHYLENE GLYCOL 3350 17 G PO PACK
17.0000 g | PACK | Freq: Every day | ORAL | Status: DC | PRN
  Administered 2016-03-02: 17 g via ORAL
  Filled 2016-02-29: qty 1

## 2016-02-29 MED ORDER — HYDROMORPHONE HCL 1 MG/ML IJ SOLN
0.5000 mg | INTRAMUSCULAR | Status: DC | PRN
  Administered 2016-02-29: 2 mg via INTRAVENOUS
  Administered 2016-02-29: 1 mg via INTRAVENOUS
  Administered 2016-02-29: 2 mg via INTRAVENOUS
  Administered 2016-02-29 – 2016-03-01 (×3): 1 mg via INTRAVENOUS
  Filled 2016-02-29: qty 2
  Filled 2016-02-29: qty 1
  Filled 2016-02-29: qty 2
  Filled 2016-02-29: qty 1
  Filled 2016-02-29: qty 2
  Filled 2016-02-29: qty 1

## 2016-02-29 MED ORDER — MENTHOL 3 MG MT LOZG: 1.0000 | LOZENGE | OROMUCOSAL | Status: DC | PRN

## 2016-02-29 MED ORDER — ACETAMINOPHEN 325 MG PO TABS: 650.0000 mg | ORAL_TABLET | Freq: Four times a day (QID) | ORAL | Status: DC | PRN

## 2016-02-29 MED ORDER — METOCLOPRAMIDE HCL 5 MG PO TABS: 5.0000 mg | ORAL_TABLET | Freq: Three times a day (TID) | ORAL | Status: DC | PRN

## 2016-02-29 MED ORDER — ACETAMINOPHEN 650 MG RE SUPP: 650.0000 mg | Freq: Four times a day (QID) | RECTAL | Status: DC | PRN

## 2016-02-29 MED ORDER — METHOCARBAMOL 500 MG PO TABS: 500.0000 mg | ORAL_TABLET | Freq: Four times a day (QID) | ORAL | Status: DC | PRN

## 2016-02-29 MED ORDER — HYDROCODONE-ACETAMINOPHEN 5-325 MG PO TABS
1.0000 | ORAL_TABLET | Freq: Four times a day (QID) | ORAL | Status: DC | PRN
  Administered 2016-02-29 – 2016-03-02 (×7): 2 via ORAL
  Filled 2016-02-29 (×7): qty 2

## 2016-02-29 MED ORDER — ALUM & MAG HYDROXIDE-SIMETH 200-200-20 MG/5ML PO SUSP
30.0000 mL | ORAL | Status: DC | PRN
Start: 2016-02-29 — End: 2016-03-02

## 2016-02-29 NOTE — Care Management Note (Signed)
Case Management Note  Patient Details  Name: Merlene Pullingric L Crihfield MRN: [id number] Date of Birth: 08-29-1961  Subjective/Objective:                    Action/Plan:  Discussed home health with patient list of agencies provided . Patient would like some time to decide on agency . Will follow. Patient in agreement with rolling walker , 3 in 1 and wheelchair same ordered.  PT recommending HHPT , will await PT recommendations , then ask MD for order  Expected Discharge Date:                  Expected Discharge Plan:  Home w Home Health Services  In-House Referral:     Discharge planning Services  CM Consult  Post Acute Care Choice:  Home Health, Durable Medical Equipment Choice offered to:  Patient  DME Arranged:  Walker rolling, 3-N-1, Wheelchair manual DME Agency:  [facility]  HH Arranged:    HH Agency:     Status of Service:  In process, will continue to follow  Medicare Important Message Given:    Date Medicare IM Given:    Medicare IM give by:    Date Additional Medicare IM Given:    Additional Medicare Important Message give by:     If discussed at Long Length of Stay Meetings, dates discussed:    Additional Comments:  [name], Lachele Lievanos Marie, RN 02/29/2016, 11:43 AM

## 2016-02-29 NOTE — Care Management Note (Signed)
Case Management Note  Patient Details  Name: Scott Brandt MRN: [id number] Date of Birth: 01-Sep-1961  Subjective/Objective:                    Action/Plan:  Will await PT/OT evals  Expected Discharge Date:                  Expected Discharge Plan:     In-House Referral:     Discharge planning Services     Post Acute Care Choice:    Choice offered to:  Patient  DME Arranged:    DME Agency:     HH Arranged:    HH Agency:     Status of Service:  In process, will continue to follow  Medicare Important Message Given:    Date Medicare IM Given:    Medicare IM give by:    Date Additional Medicare IM Given:    Additional Medicare Important Message give by:     If discussed at Long Length of Stay Meetings, dates discussed:    Additional Comments:  [name], [name], RN 02/29/2016, 8:02 AM

## 2016-02-29 NOTE — Op Note (Signed)
NAMEOmeed, Osuna Faaris                   ACCOUNT NO.:  [id number]  MEDICAL RECORD NO.:  [id number]  LOCATION:  [facility]                         FACILITY:  [facility]  PHYSICIAN:  Pietro Cassis. Alvan Dame, M.D.  DATE OF BIRTH:  Apr 24, 1961  DATE OF PROCEDURE:  02/28/2016 DATE OF DISCHARGE:                              OPERATIVE REPORT   PREOPERATIVE DIAGNOSIS:  Closed left distal 1/3rd femoral shaft fracture associated with an ipsilateral intertrochanteric proximal femur fracture.  POSTOPERATIVE DIAGNOSIS:  Closed left distal 1/3rd femoral shaft fracture associated with an ipsilateral intertrochanteric proximal femur fracture.  PROCEDURE:  Open reduction and internal fixation of left femoral shaft and intertrochanteric femur fracture utilizing a Biomet Affixus nail 9 mm x 420 mm with a 130-degree lag screw.  SURGEON:  Pietro Cassis. Alvan Dame, MD  ASSISTANT:  Surgical team.  ANESTHESIA:  General.  SPECIMENS:  None.  COMPLICATION:  None.  BLOOD LOSS:  150 mL.  INDICATIONS FOR PROCEDURE:  [name] is a very pleasant, healthy 55 year old male, who unfortunately was participating in a boot camp work out today when he was coming out of some exercise and working on sprinting. He lost his balance and as he stumbled trying to regain his balance, he felt a pop in his left femur with immediate onset of pain, inability to bear weight.  He was brought to [facility] Emergency Room where radiographs revealed this distal femur fracture, but also a pelvis view appropriately ordered revealed a nondisplaced intertrochanteric fracture from the greater trochanter incompletely through the lesser trochanter.  He was seen and evaluated in the holding area, met with his wife, reviewed the indications for surgery, discussed the risks of infection, malunion, nonunion, need for future surgery.  We discussed the expected postoperative course and function and returned once the skeleton has healed.  Consent was obtained for benefit of  fracture care management.  PROCEDURE IN DETAIL:  The patient was brought to operative theater. Once adequate anesthesia, preoperative antibiotics, 2 g Ancef administered, he was positioned supine on the fracture table.  His unaffected right lower extremity was flexed and abducted out of the way and placed in the leg holder with bony prominences padded over the perineal, particularly laterally at the peroneal nerve, padded perineal post was placed and the left foot placed in the traction shoe.  At this point, traction was applied within the leg in a neutral position. Fluoroscopy was used to confirm orientation noting an external rotated proximal femurs to be expected with the association of the shaft.  Once this was reviewed and appropriate traction applied at the fracture site, we then prepped and draped the left leg from the iliac crest down below the knee for appropriate circle technique at the distal aspect of the femur.  The knee was prepped and draped with a non-Betadine based product in a shower curtain technique.  Fluoroscopy was brought back to the field.  The proximal femur was identified and incision made laterally.  The gluteal fascia was split and a guidewire was then positioned into the proximal greater trochanter slightly medial.  Once I had the guidewire in the proximal femur, I then drilled the proximal femur and allowing  me to insert 1st starting awl and then the ball-tipped guidewire.  I then replaced the awl with the reduction tool and passed this down over the guidewire to the fracture site.  At this point, evaluating the fracture dynamics and the forces, we were able to pass the guidewire across the fracture site into the distal femur, confirming this radiographically in AP and lateral planes.  With the fracture reduced, with the guidewire, I then began reaming 1st with an 8-mm reamer and then by 0.5 mm increments up to 11 mm increments.  He was noted to have a  very tight isthmus of his femoral diaphysis.  We selected 9 mm nail.  The 9 mm nail was selected at 420 mm length based on the measuring.  The nail was then passed over the guidewire to its appropriate depth proximally and distally across the fracture site.  Once confirmed with its orientation, the guidewire was inserted into the center of the femoral head in AP and lateral planes for the lag screw. A 105 mm lag screw was then passed.  Once I compressed and stabilized, I did lock this down and backed it off a quarter turn, to allow for some compression and healing of the intertroch segment.  Once this was completed, we evaluated the fracture pattern.  I did do some back slapping against the foot plate to further compress the fracture site and then placed 2 distal interlocks through perfect circle technique.  Once this was completed, the final radiographs were obtained in AP and lateral planes.  All wounds were then irrigated.  The distal 2 incisions were closed with 2-0 Vicryl and surgical glue.  The proximal wound was closed with #1 Vicryl on the gluteal fascia followed by 2-0 Vicryl and running 3-0 Monocryl.  The lag screw incision was closed with 2-0 Vicryl and surgical glue.  Once this was done, the wound was cleaned, dried, and dressed sterilely with Mepilex dressings.  He was awoken from anesthesia and brought to the recovery room in stable condition, tolerating the procedure well.  Based on his fracture pattern, the size of his isthmus and his bone was compared to the metaphyseal region.  I did place him in a knee immobilizer for comfort.  May have him use this for comfort for a couple weeks and allowed to be partial weightbearing.  If we assume healing time in the next 3 or 4 months, then we will progress his weightbearing activity to allow him to return to the active lifestyle he wishes to participate in.     Pietro Cassis Alvan Dame, M.D.     MDO/MEDQ  D:  02/29/2016  T:   02/29/2016  Job:  [id number]

## 2016-02-29 NOTE — Progress Notes (Signed)
Pt a/o, c/o left leg pain, PRN meds given as ordered, at 0737 pt O2 on room air 97% O2 removed, however after pain med given O2 86 on room air, 2L nasal canula put back on O2 95%, pt oob with PT today, VSS, pt stable

## 2016-02-29 NOTE — Progress Notes (Signed)
Ortho tech notified that patient needs trapeze bar.[facility] Trompeter [employer]

## 2016-02-29 NOTE — Anesthesia Postprocedure Evaluation (Signed)
Anesthesia Post Note  Patient: Scott Brandt  Procedure(s) Performed: Procedure(s) (LRB): INTRAMEDULLARY (IM) NAIL FEMORAL (Left)  Patient location during evaluation: PACU Anesthesia Type: General Level of consciousness: awake and alert Pain management: pain level controlled Vital Signs Assessment: post-procedure vital signs reviewed and stable Respiratory status: spontaneous breathing, nonlabored ventilation, respiratory function stable and patient connected to nasal cannula oxygen Cardiovascular status: blood pressure returned to baseline and stable Postop Assessment: no signs of nausea or vomiting Anesthetic complications: no    Last Vitals:  Filed Vitals:   02/28/16 2130 02/29/16 0038  BP: 148/86 140/88  Pulse: 70   Temp:  36.1 C  Resp: 17 18    Last Pain:  Filed Vitals:   02/29/16 0048  PainSc: Asleep                 Reino KentJudd, Annamary Buschman J

## 2016-02-29 NOTE — Progress Notes (Signed)
Orthopedic Tech Progress Note Patient Details:  Scott Brandt Sep 02, 1961 [id number]  Patient ID: Scott Brandt, male   DOB: Sep 02, 1961, 55 y.o.   MRN: [id number]   Scott Brandt 02/29/2016, 9:58 AMTrapeze bar

## 2016-02-29 NOTE — Evaluation (Signed)
Occupational Therapy Evaluation Patient Details Name: Scott Brandt MRN: [id number] DOB: December 02, 1961 Today's Date: 02/29/2016    History of Present Illness Patient is a 55 yo male with Closed left distal 1/3rd femoral shaft fracture, now s/p ORIF.   Clinical Impression   Patient presenting with acute pain, decreased I in self care, decreased balance, decreased functional transfers, decreased activity/standing tolerance. Patient reports being I PTA. Patient currently functioning at min A overall. Patient will benefit from acute OT to increase overall independence in the areas of ADLs, functional mobility, and safety in order to safely discharge home.    Follow Up Recommendations  Home health OT;Supervision - Intermittent    Equipment Recommendations  3 in 1 bedside comode;Other (comment) (AE for LB - LH reacher, sock aide, LH sponge)    Recommendations for Other Services       Precautions / Restrictions Precautions Precautions: Fall Required Braces or Orthoses: Knee Immobilizer - Left Knee Immobilizer - Left: On at all times Restrictions Weight Bearing Restrictions: Yes LLE Weight Bearing: Partial weight bearing      Mobility Bed Mobility Overal bed mobility: Needs Assistance Bed Mobility: Supine to Sit     Supine to sit: Min assist     General bed mobility comments: min assist for LLE movement to EOB and support in coming off EOB  Transfers Overall transfer level: Needs assistance Equipment used: Rolling walker (2 wheeled) Transfers: Sit to/from Stand Sit to Stand: Min assist              Balance Overall balance assessment: Needs assistance         Standing balance support: Bilateral upper extremity supported Standing balance-Leahy Scale: Fair Standing balance comment: use of RW for support             ADL Overall ADL's : Needs assistance/impaired         Upper Body Bathing: Set up;Sitting   Lower Body Bathing: Moderate assistance;Sit to/from  stand   Upper Body Dressing : Set up;Sitting   Lower Body Dressing: Moderate assistance;Sit to/from stand   Toilet Transfer: Minimal assistance;Comfort height toilet;RW   Toileting- Clothing Manipulation and Hygiene: Minimal assistance;Sit to/from stand         General ADL Comments: Upon entering the room, pt supine in bed. Min A to get R LE to EOB. Pt required min A for sit <>stand from bed with min verbal cues for proper technique. Pt able to ambulated 4' forwards with RW and then backwards 3' to recliner chair. Pt reports extreme fatigue with short ambulating. Pt also observed to be holding breath with O2 dropping to 80 % but immediately rising to 96% with 15 seconds of pursed lip breathing. Pt remained in recliner chair at end of session.                Pertinent Vitals/Pain Pain Assessment: 0-10 Pain Score: 7  Pain Location: L LE Pain Descriptors / Indicators: Aching;Sore;Guarding Pain Intervention(s): Limited activity within patient's tolerance;Monitored during session;Repositioned;Patient requesting pain meds-RN notified     Hand Dominance Right   Extremity/Trunk Assessment Upper Extremity Assessment Upper Extremity Assessment: Overall WFL for tasks assessed   Lower Extremity Assessment Lower Extremity Assessment: Defer to PT evaluation       Communication Communication Communication: No difficulties   Cognition Arousal/Alertness: Awake/alert Behavior During Therapy: WFL for tasks assessed/performed Overall Cognitive Status: Within Functional Limits for tasks assessed  Home Living Family/patient expects to be discharged to:: Private residence Living Arrangements: Spouse/significant other Available Help at Discharge: Family;Available 24 hours/day Type of Home: House Home Access: Stairs to enter Entergy CorporationEntrance Stairs-Number of Steps: 3 Entrance Stairs-Rails: None Home Layout: One level     Bathroom Shower/Tub: Company secretaryWalk-in shower    Bathroom Toilet: Standard Bathroom Accessibility: Yes How Accessible: Accessible via walker Home Equipment: None          Prior Functioning/Environment Level of Independence: Independent        Comments: was working out on basketball court for "boot camp"  when fracture occured    OT Diagnosis: Acute pain   OT Problem List: Decreased strength;Decreased activity tolerance;Impaired balance (sitting and/or standing);Decreased safety awareness;Pain;Decreased knowledge of use of DME or AE   OT Treatment/Interventions: Self-care/ADL training;Therapeutic exercise;Energy conservation;DME and/or AE instruction;Manual therapy;Patient/family education;Therapeutic activities;Balance training    OT Goals(Current goals can be found in the care plan section) Acute Rehab OT Goals Patient Stated Goal: to go home OT Goal Formulation: With patient Time For Goal Achievement: 03/14/16 Potential to Achieve Goals: Good ADL Goals Pt Will Perform Lower Body Bathing: with supervision;sit to/from stand Pt Will Perform Lower Body Dressing: with supervision;sit to/from stand Pt Will Transfer to Toilet: with modified independence;ambulating Pt Will Perform Toileting - Clothing Manipulation and hygiene: with modified independence;sit to/from stand Pt Will Perform Tub/Shower Transfer: Shower transfer;with supervision;rolling walker;ambulating;shower seat  OT Frequency: Min 2X/week   Barriers to D/C: Other (comment)  none known at this time          End of Session Equipment Utilized During Treatment: Engineer, waterolling walker Nurse Communication: Mobility status;Patient requests pain meds;Other (comment) (oxygen left off and to be monitored)  Activity Tolerance: Patient limited by fatigue Patient left: in chair;with call bell/phone within reach;with family/visitor present   Time: 9604-54091426-1457 OT Time Calculation (min): 31 min Charges:  OT General Charges $OT Visit: 1 Procedure OT Evaluation $OT Eval Moderate  Complexity: 1 Procedure OT Treatments $Self Care/Home Management : 8-22 mins G-Codes:    Lowella GripPittman, Trueman Worlds L, MS, OTR/L 02/29/2016, 3:16 PM

## 2016-02-29 NOTE — Evaluation (Signed)
Physical Therapy Evaluation Patient Details Name: Scott Brandt L Biswas MRN: [id number] DOB: 30-Jun-1961 Today's Date: 02/29/2016   History of Present Illness  Patient is a 55 yo male with Closed left distal 1/3rd femoral shaft fracture, now s/p ORIF.  Clinical Impression  Patient demonstrates deficits in functional mobility as indicated below. Will need continued skilled PT to address deficits and maximize function. Will see as indicated and progress as tolerated. Will need continued transfer training and stair training. Possibily need w/c in addition to RW for safe mobility if patient does not progress with PWBing.   OF NOTE: At this time, session limited to OOB to chair transfer secondary to patient becoming, pale, diaphoretic, dizzy and nauseated. BP assessed 122/73 HR 84    Follow Up Recommendations Home health PT;Supervision for mobility/OOB    Equipment Recommendations  Rolling walker with 5" wheels;3in1 (PT) (May need wheel chair for any community mobility)    Recommendations for Other Services       Precautions / Restrictions Precautions Precautions: Fall Required Braces or Orthoses: Knee Immobilizer - Left Knee Immobilizer - Left: On at all times Restrictions Weight Bearing Restrictions: Yes LLE Weight Bearing: Partial weight bearing      Mobility  Bed Mobility Overal bed mobility: Needs Assistance Bed Mobility: Supine to Sit     Supine to sit: Min assist     General bed mobility comments: min assist for LLE movement to EOB and support in coming off EOB  Transfers Overall transfer level: Needs assistance Equipment used: Rolling walker (2 wheeled) Transfers: Sit to/from [employer]GI CorporationStand;Stand Pivot Transfers Sit to Stand: Min assist Stand pivot transfers: Min guard       General transfer comment: Min assist for stability in elevation to standing with RW. Cues for hand placement and safety. min guard during pivot to chair.  Ambulation/Gait             General Gait  Details: did not perform secondary to nausea, dizzy and diaphoretic  Stairs            Wheelchair Mobility    Modified Rankin (Stroke Patients Only)       Balance                                             Pertinent Vitals/Pain Pain Assessment: 0-10    Home Living Family/patient expects to be discharged to:: Private residence   Available Help at Discharge: Family Type of Home: House Home Access: Level entry     Home Layout: One level Home Equipment: None      Prior Function Level of Independence: Independent         Comments: was working out at the gym when fracture occured     Hand Dominance   Dominant Hand: Right    Extremity/Trunk Assessment   Upper Extremity Assessment: Overall WFL for tasks assessed           Lower Extremity Assessment: LLE deficits/detail         Communication   Communication: No difficulties  Cognition Arousal/Alertness: Awake/alert Behavior During Therapy: WFL for tasks assessed/performed Overall Cognitive Status: Within Functional Limits for tasks assessed                      General Comments      Exercises        Assessment/Plan  PT Assessment Patient needs continued PT services  PT Diagnosis Difficulty walking;Abnormality of gait;Acute pain   PT Problem List Decreased strength;Decreased activity tolerance;Decreased balance;Decreased mobility;Decreased knowledge of use of DME;Pain  PT Treatment Interventions DME instruction;Gait training;Stair training;Functional mobility training;Therapeutic activities;Therapeutic exercise;Balance training;Patient/family education   PT Goals (Current goals can be found in the Care Plan section) Acute Rehab PT Goals Patient Stated Goal: to go home PT Goal Formulation: With patient/family Time For Goal Achievement: 03/14/16 Potential to Achieve Goals: Good    Frequency Min 5X/week (trauma)   Barriers to discharge         Co-evaluation               End of Session Equipment Utilized During Treatment: Gait belt;Oxygen Activity Tolerance: Treatment limited secondary to medical complications (Comment) (nauseated, pale, diaphoretic and dizzy with standing) Patient left: in chair;with call bell/phone within reach;with family/visitor present (oxygen on) Nurse Communication: Mobility status         Time: 1324-4010 PT Time Calculation (min) (ACUTE ONLY): 25 min   Charges:   PT Evaluation $PT Eval Moderate Complexity: 1 Procedure PT Treatments $Therapeutic Activity: 8-22 mins   PT G CodesFabio Asa 03-12-2016, 10:45 AM Charlotte Crumb, PT DPT  [phone]

## 2016-02-29 NOTE — Progress Notes (Signed)
Patient ID: Scott Brandt, male   DOB: 1961/12/23, 55 y.o.   MRN: [id number] Subjective: 1 Day Post-Op Procedure(s) (LRB): INTRAMEDULLARY (IM) NAIL FEMORAL (Left)    Patient reports pain as moderate.  Has done some limited therapy Spoke to him on phone to address questions  Objective:   VITALS:   Filed Vitals:   02/29/16 1355 02/29/16 2139  BP: 126/73 136/72  Pulse: 76 70  Temp: 98.1 F (36.7 C) 100.3 F (37.9 C)  Resp: 18 17    Neurovascular intact Incision: dressing C/D/I  LABS  Recent Labs  02/28/16 2001 02/29/16 0557  HGB 14.0 12.9*  HCT 41.5 38.3*  WBC 7.3 8.8  PLT 211 224     Recent Labs  02/28/16 2001 02/29/16 0557  NA 142 140  K 4.4 4.5  BUN 14 10  CREATININE 1.32* 1.22  GLUCOSE 111* 134*     Recent Labs  02/28/16 2143  INR 1.03     Assessment/Plan: 1 Day Post-Op Procedure(s) (LRB): INTRAMEDULLARY (IM) NAIL FEMORAL (Left)   Advance diet Discharge home with home health probably Thursday  rreviewed questions and concerns for now

## 2016-02-29 NOTE — Transfer of Care (Signed)
Immediate Anesthesia Transfer of Care Note  Patient: Scott Brandt  Procedure(s) Performed: Procedure(s): INTRAMEDULLARY (IM) NAIL FEMORAL (Left)  Patient Location: PACU  Anesthesia Type:General  Level of Consciousness: awake, alert  and oriented  Airway & Oxygen Therapy: Patient Spontanous Breathing  Post-op Assessment: Report given to RN and Post -op Vital signs reviewed and stable  Post vital signs: Reviewed and stable  Last Vitals:  Filed Vitals:   02/28/16 2115 02/28/16 2130  BP: 165/99 148/86  Pulse: 65 70  Temp:    Resp: 14 17    Complications: No apparent anesthesia complications

## 2016-03-01 LAB — BASIC METABOLIC PANEL
Anion gap: 11 (ref 5–15)
BUN: 8 mg/dL (ref 6–20)
CHLORIDE: 105 mmol/L (ref 101–111)
CO2: 24 mmol/L (ref 22–32)
CREATININE: 1.12 mg/dL (ref 0.61–1.24)
Calcium: 8.2 mg/dL — ABNORMAL LOW (ref 8.9–10.3)
GFR calc non Af Amer: >60 mL/min (ref >60)
GLUCOSE: 88 mg/dL (ref 65–99)
Potassium: 3.9 mmol/L (ref 3.5–5.1)
Sodium: 140 mmol/L (ref 135–145)

## 2016-03-01 LAB — CBC
HEMATOCRIT: 39 % (ref 39.0–52.0)
HEMOGLOBIN: 13 g/dL (ref 13.0–17.0)
MCH: 30.7 pg (ref 26.0–34.0)
MCHC: 33.3 g/dL (ref 30.0–36.0)
MCV: 92 fL (ref 78.0–100.0)
Platelets: 183 10*3/uL (ref 150–400)
RBC: 4.24 MIL/uL (ref 4.22–5.81)
RDW: 13.8 % (ref 11.5–15.5)
WBC: 6.2 10*3/uL (ref 4.0–10.5)

## 2016-03-01 NOTE — Care Management Note (Signed)
Case Management Note  Patient Details  Name: Scott Brandt MRN: [id number] Date of Birth: 1961/05/27  Subjective/Objective:                    Action/Plan:   Expected Discharge Date:                  Expected Discharge Plan:  Home w Home Health Services  In-House Referral:     Discharge planning Services  CM Consult  Post Acute Care Choice:  Home Health, Durable Medical Equipment Choice offered to:  Patient  DME Arranged:  Walker rolling, 3-N-1, Wheelchair manual DME Agency:  [facility]  HH Arranged:  PT, OT HH Agency:  [facility]  Status of Service:  Completed, signed off  Medicare Important Message Given:    Date Medicare IM Given:    Medicare IM give by:    Date Additional Medicare IM Given:    Additional Medicare Important Message give by:     If discussed at Long Length of Stay Meetings, dates discussed:    Additional Comments:  [name], [name], RN 03/01/2016, 9:34 AM

## 2016-03-01 NOTE — Progress Notes (Signed)
Patient ID: Scott Brandt, male   DOB: 09-22-1961, 55 y.o.   MRN: [id number] Subjective: 2 Days Post-Op Procedure(s) (LRB): INTRAMEDULLARY (IM) NAIL FEMORAL (Left)    Patient reports pain as moderate.  Particularly after PT today  Objective:   VITALS:   Filed Vitals:   03/01/16 1636 03/01/16 2118  BP: 145/88 149/83  Pulse: 75 75  Temp: 98.8 F (37.1 C) 98.9 F (37.2 C)  Resp: 18 18    Neurovascular intact Incision: dressing C/D/I   Left LE knee immobilizer in place  LABS  Recent Labs  02/28/16 2001 02/29/16 0557 03/01/16 0523  HGB 14.0 12.9* 13.0  HCT 41.5 38.3* 39.0  WBC 7.3 8.8 6.2  PLT 211 224 183     Recent Labs  02/28/16 2001 02/29/16 0557 03/01/16 0523  NA 142 140 140  K 4.4 4.5 3.9  BUN 14 10 8   CREATININE 1.32* 1.22 1.12  GLUCOSE 111* 134* 88     Recent Labs  02/28/16 2143  INR 1.03     Assessment/Plan: 2 Days Post-Op Procedure(s) (LRB): INTRAMEDULLARY (IM) NAIL FEMORAL (Left)   Up with therapy Plan for discharge tomorrow, most likely Discharge home with home health Rx on chart signed

## 2016-03-01 NOTE — Progress Notes (Signed)
Physical Therapy Treatment Patient Details Name: Scott Brandt L Ailes MRN: [id number] DOB: [birth year] Today's Date: 03/01/2016    History of Present Illness Patient is a 55 yo male with Closed left distal 1/3rd femoral shaft fracture, now s/p ORIF.    PT Comments    Patient is making good progress with mobility today however continues to be limited by pain and anxiety. Answer several questions regarding rehab process, home set up, and mobility with patient and his wife. She appears to be very anxious about his mobility as well. Discussed that plan will be to attempt steps tomorrow and he will be medically ready to DC. Per wife she will be present throughout the morning tomorrow and would like to see steps as well.   Follow Up Recommendations  Home health PT;Supervision for mobility/OOB     Equipment Recommendations  Rolling walker with 5" wheels;3in1 (PT)    Recommendations for Other Services       Precautions / Restrictions Precautions Precautions: Fall Required Braces or Orthoses: Knee Immobilizer - Left Knee Immobilizer - Left: On at all times Restrictions Weight Bearing Restrictions: Yes LLE Weight Bearing: Partial weight bearing LLE Partial Weight Bearing Percentage or Pounds: 50%    Mobility  Bed Mobility               General bed mobility comments: pt in chair  Transfers Overall transfer level: Needs assistance Equipment used: Rolling walker (2 wheeled) Transfers: Sit to/from Stand Sit to Stand: Min assist         General transfer comment: VC for hand placement and getting LLE in best position. A to power up into stand. Cues to use UEs to control descent back to recliner  Ambulation/Gait Ambulation/Gait assistance: Min assist Ambulation Distance (Feet): 15 Feet (x2) Assistive device: Rolling walker (2 wheeled) Gait Pattern/deviations: Step-to pattern (hop to)   Gait velocity interpretation: Below normal speed for age/gender General Gait Details: Patient does  not place LLE on the ground with ambulation due to what appeared to be fear of any weight through LLE. Cues to attempt to at least touch toes to floor and to work on throughout evening when getting up with staff.    Stairs            Wheelchair Mobility    Modified Rankin (Stroke Patients Only)       Balance                                    Cognition Arousal/Alertness: Awake/alert Behavior During Therapy: WFL for tasks assessed/performed;Anxious Overall Cognitive Status: Within Functional Limits for tasks assessed                      Exercises General Exercises - Lower Extremity Ankle Circles/Pumps: AROM;Both;10 reps    General Comments        Pertinent Vitals/Pain Pain Score: 10-Worst pain ever Pain Location: LLE Pain Descriptors / Indicators: Sore;Aching Pain Intervention(s): Monitored during session;RN gave pain meds during session    Home Living                      Prior Function            PT Goals (current goals can now be found in the care plan section) Progress towards PT goals: Progressing toward goals    Frequency  Min 5X/week    PT Plan Current plan  remains appropriate    Co-evaluation             End of Session   Activity Tolerance: Patient tolerated treatment well;Patient limited by pain Patient left: in chair;with call bell/phone within reach;with family/visitor present     Time: 4098-1191 PT Time Calculation (min) (ACUTE ONLY): 24 min  Charges:  $Gait Training: 8-22 mins $Therapeutic Activity: 8-22 mins                    G Codes:      Fredrich Birks 03/01/2016, 1:27 PM  03/01/2016 Sanyiah Kanzler, Adline Potter PTA

## 2016-03-02 NOTE — Progress Notes (Signed)
   03/02/16 0900  PT Visit Information  Last PT Received On 03/02/16  Assistance Needed +1  History of Present Illness Patient is a 55 yo male with Closed left distal 1/3rd femoral shaft fracture, now s/p ORIF.  PT Time Calculation  PT Start Time (ACUTE ONLY) 0826  PT Stop Time (ACUTE ONLY) 0854  PT Time Calculation (min) (ACUTE ONLY) 28 min  Subjective Data  Patient Stated Goal to go home  Precautions  Precautions Fall  Required Braces or Orthoses Knee Immobilizer - Left  Knee Immobilizer - Left On at all times  Restrictions  Weight Bearing Restrictions Yes  LLE Weight Bearing PWB  LLE Partial Weight Bearing Percentage or Pounds 50%  Pain Assessment  Pain Assessment 0-10  Pain Score 4  Pain Location LLE  Pain Descriptors / Indicators Sore;Aching  Pain Intervention(s) Monitored during session;RN gave pain meds during session  Cognition  Arousal/Alertness Awake/alert  [facility] tasks assessed/performed;Anxious  Overall Cognitive Status Within Functional Limits for tasks assessed  Bed Mobility  Overal bed mobility Needs Assistance  Bed Mobility Supine to Sit  Supine to sit Min assist  General bed mobility comments assist for LE support and movement to EOB  Transfers  Overall transfer level Needs assistance  Equipment used Rolling walker (2 wheeled)  Transfers Sit to/from Stand  Sit to Stand Min assist  Stand pivot transfers Min guard  General transfer comment VC for hand placement and getting LLE in best position. A to power up into stand. Cues to use UEs to control descent back to recliner  Ambulation/Gait  Ambulation/Gait assistance Min guard  Ambulation Distance (Feet) 30 Feet  Assistive device Rolling walker (2 wheeled)  Gait Pattern/deviations Step-to pattern  General Gait Details patient remains self limiting with WBing on LLE despite cues. Patient with increased light headedness with increased activity  Gait velocity interpretation Below  normal speed for age/gender  Stairs Yes  Stairs assistance Mod assist  Stair Management Seated/boosting  Number of Stairs 4  General stair comments Spoke with patient at length regarding several different ways to navigate stairs to get into the home. patient initially states that it would just be him and his wife, educated and trials bump up technique.   Balance  Standing balance support Bilateral upper extremity supported  Standing balance-Leahy Scale Fair  General Exercises - Lower Extremity  Ankle Circles/Pumps AROM;Both;10 reps  PT - End of Session  Equipment Utilized During Treatment Gait belt;Oxygen  Activity Tolerance Patient tolerated treatment well;Patient limited by pain  Patient left in chair;with call bell/phone within reach;with family/visitor present  Nurse Communication Mobility status  PT - Assessment/Plan  PT Plan Current plan remains appropriate  PT Frequency (ACUTE ONLY) Min 5X/week  Follow Up Recommendations Home health PT;Supervision for mobility/OOB  PT equipment Rolling walker with 5" wheels;3in1 (PT)  Acute Rehab PT Goals  PT Goal Formulation With patient/family  Time For Goal Achievement 03/14/16  Potential to Achieve Goals Good  PT General Charges  $$ ACUTE PT VISIT 1 Procedure  PT Treatments  $Gait Training 8-22 mins  $Therapeutic Activity 8-22 mins    Charlotte Crumbevon Anna Livers, PT DPT  [phone]

## 2016-03-02 NOTE — Progress Notes (Signed)
Patient ID: Scott Brandt, male   DOB: September 28, 1961, 55 y.o.   MRN: [id number] Subjective: 3 Days Post-Op Procedure(s) (LRB): INTRAMEDULLARY (IM) NAIL FEMORAL (Left)    Patient reports pain as mild to moderate  Objective:   VITALS:   Filed Vitals:   03/01/16 2118 03/02/16 0557  BP: 149/83 140/73  Pulse: 75 71  Temp: 98.9 F (37.2 C) 98.7 F (37.1 C)  Resp: 18 18    Neurovascular intact Incision: dressing C/D/I  LABS  Recent Labs  02/28/16 2001 02/29/16 0557 03/01/16 0523  HGB 14.0 12.9* 13.0  HCT 41.5 38.3* 39.0  WBC 7.3 8.8 6.2  PLT 211 224 183     Recent Labs  02/28/16 2001 02/29/16 0557 03/01/16 0523  NA 142 140 140  K 4.4 4.5 3.9  BUN 14 10 8   CREATININE 1.32* 1.22 1.12  GLUCOSE 111* 134* 88     Recent Labs  02/28/16 2143  INR 1.03     Assessment/Plan: 3 Days Post-Op Procedure(s) (LRB): INTRAMEDULLARY (IM) NAIL FEMORAL (Left)   Up with therapy Discharge home with home health   Please change dressing prior to discharge  RTC in 2 weeks

## 2016-03-02 NOTE — Discharge Instructions (Signed)
50% weight bearing LLE until further directed Call or use portal for any questions  Maintain dressings until follow up May shower with dressings on as they are water proof

## 2016-03-02 NOTE — Care Management Note (Signed)
Case Management Note  Patient Details  Name: Scott Brandt MRN: [id number] Date of Birth: 13-Aug-1961  Subjective/Objective:                    Action/Plan:   Expected Discharge Date:                  Expected Discharge Plan:  Home w Home Health Services  In-House Referral:     Discharge planning Services  CM Consult  Post Acute Care Choice:  Home Health, Durable Medical Equipment Choice offered to:  Patient  DME Arranged:  Walker rolling, 3-N-1, Wheelchair manual, Trapeze, Hospital bed DME Agency:  [facility]  [facility]  [facility]  Status of Service:  Completed, signed off  Medicare Important Message Given:    Date Medicare IM Given:    Medicare IM give by:    Date Additional Medicare IM Given:    Additional Medicare Important Message give by:     If discussed at Long Length of Stay Meetings, dates discussed:    Additional Comments:  [name], Malaia Buchta Marie, RN 03/02/2016, 9:49 AM

## 2016-03-06 ENCOUNTER — Encounter: Payer: Self-pay | Admitting: Allergy and Immunology

## 2016-03-06 NOTE — Discharge Summary (Signed)
Physician Discharge Summary  Patient ID: Scott Brandt MRN: [id number] DOB/AGE: 08-21-1961 55 y.o.  Admit date: 02/28/2016 Discharge date: 03/02/2016   Procedures:  Procedure(s) (LRB): INTRAMEDULLARY (IM) NAIL FEMORAL (Left)  Attending Physician:  Dr. Durene RomansMatthew Olin   Admission Diagnoses:   Left thigh pain   Discharge Diagnoses:  Active Problems:   Closed fracture of left femur, initial encounter  Past Medical History  Diagnosis Date  . Urticaria   . Asthma     signs of asthma from Dr Sherene SiresWert   . Hypercholesteremia   . Asthma     HPI:    Pt is a 55 y.o. male complaining of left thigh pain, inability to bear weight after stumbling while trying to run [facility]. Felt pop in thigh as he was trying to recover balance.  PCP: Wilson SingerArvind N Jariwala, MD   Discharged Condition: good  Hospital Course:  Patient underwent the above stated procedure on 02/28/2016. Patient tolerated the procedure well and brought to the recovery room in good condition and subsequently to the floor.  POD #1 BP: 136/72 ; Pulse: 70 ; Temp: 100.3 F (37.9 C) ; Resp: 17 Patient reports pain as moderate. Has done some limited therapy. Neurovascular intact and incision: dressing C/D/I  LABS  Basename    HGB     12.9  HCT     38.3   POD #2  BP: 149/83 ; Pulse: 75 ; Temp: 98.9 F (37.2 C) ; Resp: 18 Patient reports pain as moderate. Particularly after PT today. Neurovascular intact and incision: dressing C/D/I.  Left LE knee immobilizer in place.  LABS  Basename    HGB     13.0  HCT     39.0   POD #3  BP: 140/73 ; Pulse: 71 ; Temp: 98.7 F (37.1 C) ; Resp: 18 Patient reports pain as mild to moderate.  Ready to be discharged home. Neurovascular intact and incision: dressing C/D/I  LABS   No new labs   Discharge Exam: General appearance: alert, cooperative and no distress Extremities: Homans sign is negative, no sign of DVT, no edema, redness or tenderness in the calves or  thighs and no ulcers, gangrene or trophic changes  Disposition: Home with follow up in 2 weeks   Follow-up Information    Follow up with Shelda PalLIN,Whitney Bingaman D, MD. Schedule an appointment as soon as possible for a visit in 2 weeks.   Specialty:  Orthopedic Surgery   Why:  For X-rays of lef thip and femur   Contact information:   [address] [place] [address] [phone]       Discharge Instructions    Call MD / Call 911    Complete by:  As directed   If you experience chest pain or shortness of breath, CALL 911 and be transported to the hospital emergency room.  If you develope a fever above 101 F, pus (white drainage) or increased drainage or redness at the wound, or calf pain, call your surgeon's office.     Constipation Prevention    Complete by:  As directed   Drink plenty of fluids.  Prune juice may be helpful.  You may use a stool softener, such as Colace (over the counter) 100 mg twice a day.  Use MiraLax (over the counter) for constipation as needed.     Diet - low sodium heart healthy    Complete by:  As directed      Discharge  instructions    Complete by:  As directed   Maintain surgical dressing until follow up in the clinic. If the edges start to pull up, may reinforce with tape. If the dressing is no longer working, may remove and cover with gauze and tape, but must keep the area dry and clean.  Follow up in 2 weeks at [facility]. Call with any questions or concerns.     Discharge wound care:    Complete by:  As directed   Replace dressing after 3-4 days.  Change to gauze and tape, but must keep the area dry and clean.  Call with any questions or concerns.     Partial weight bearing    Complete by:  As directed   Knee Immobilizer for comfort.  % Body Weight:  50  Laterality:  left  Extremity:  Lower             Medication List    STOP taking these medications        aspirin 81 MG tablet  Replaced by:  aspirin 325 MG EC tablet        TAKE these medications        aspirin 325 MG EC tablet  Take 1 tablet (325 mg total) by mouth 2 (two) times daily with a meal.     atorvastatin 20 MG tablet  Commonly known as:  LIPITOR  Take 20 mg by mouth every other day.     beclomethasone 80 MCG/ACT inhaler  Commonly known as:  QVAR  Inhale 1 puff into the lungs 2 (two) times daily.     docusate sodium 100 MG capsule  Commonly known as:  COLACE  Take 1 capsule (100 mg total) by mouth 2 (two) times daily.     HYDROcodone-acetaminophen 5-325 MG tablet  Commonly known as:  NORCO/VICODIN  Take 1-2 tablets by mouth every 6 (six) hours as needed for moderate pain.     methocarbamol 500 MG tablet  Commonly known as:  ROBAXIN  Take 1 tablet (500 mg total) by mouth every 6 (six) hours as needed for muscle spasms.     polyethylene glycol packet  Commonly known as:  MIRALAX / GLYCOLAX  Take 17 g by mouth daily as needed for mild constipation.         Signed: Anastasio Auerbach. Amit Leece   PA-C  03/06/2016, 4:39 PM

## 2016-03-08 ENCOUNTER — Encounter (HOSPITAL_COMMUNITY): Payer: Self-pay | Admitting: Orthopedic Surgery

## 2016-03-14 ENCOUNTER — Encounter (HOSPITAL_COMMUNITY): Payer: Self-pay | Admitting: Orthopedic Surgery

## 2016-03-20 ENCOUNTER — Encounter (HOSPITAL_COMMUNITY): Payer: Self-pay | Admitting: Orthopedic Surgery

## 2016-03-24 ENCOUNTER — Ambulatory Visit (INDEPENDENT_AMBULATORY_CARE_PROVIDER_SITE_OTHER): Payer: BLUE CROSS/BLUE SHIELD | Admitting: *Deleted

## 2016-03-24 DIAGNOSIS — J309 Allergic rhinitis, unspecified: Secondary | ICD-10-CM | POA: Diagnosis not present

## 2016-04-03 ENCOUNTER — Ambulatory Visit (INDEPENDENT_AMBULATORY_CARE_PROVIDER_SITE_OTHER): Payer: BLUE CROSS/BLUE SHIELD

## 2016-04-03 DIAGNOSIS — J309 Allergic rhinitis, unspecified: Secondary | ICD-10-CM | POA: Diagnosis not present

## 2016-04-10 ENCOUNTER — Ambulatory Visit (INDEPENDENT_AMBULATORY_CARE_PROVIDER_SITE_OTHER): Payer: BLUE CROSS/BLUE SHIELD

## 2016-04-10 DIAGNOSIS — J309 Allergic rhinitis, unspecified: Secondary | ICD-10-CM

## 2016-04-19 ENCOUNTER — Ambulatory Visit (INDEPENDENT_AMBULATORY_CARE_PROVIDER_SITE_OTHER): Payer: BLUE CROSS/BLUE SHIELD

## 2016-04-19 DIAGNOSIS — J309 Allergic rhinitis, unspecified: Secondary | ICD-10-CM | POA: Diagnosis not present

## 2016-04-24 NOTE — Progress Notes (Signed)
I answered multiple questions regarding anaphylaxis for patient at ITX visit.  He states he is not having any problems today, but wants to be aware if something should present itself.  He states he had a "wierd feeling" in his throat one time.  I advise him that when he has any type of reaction that he needs to let us know.  He verbalized understanding and once again stated that he did not have any problems today.

## 2016-04-25 ENCOUNTER — Ambulatory Visit (INDEPENDENT_AMBULATORY_CARE_PROVIDER_SITE_OTHER): Payer: BLUE CROSS/BLUE SHIELD

## 2016-04-25 DIAGNOSIS — J309 Allergic rhinitis, unspecified: Secondary | ICD-10-CM

## 2016-05-01 ENCOUNTER — Ambulatory Visit (INDEPENDENT_AMBULATORY_CARE_PROVIDER_SITE_OTHER): Payer: BLUE CROSS/BLUE SHIELD | Admitting: *Deleted

## 2016-05-01 DIAGNOSIS — J309 Allergic rhinitis, unspecified: Secondary | ICD-10-CM

## 2016-05-10 ENCOUNTER — Ambulatory Visit (INDEPENDENT_AMBULATORY_CARE_PROVIDER_SITE_OTHER): Payer: BLUE CROSS/BLUE SHIELD

## 2016-05-10 DIAGNOSIS — J309 Allergic rhinitis, unspecified: Secondary | ICD-10-CM

## 2016-05-16 ENCOUNTER — Ambulatory Visit: Payer: BLUE CROSS/BLUE SHIELD | Admitting: Allergy and Immunology

## 2016-06-02 ENCOUNTER — Ambulatory Visit (INDEPENDENT_AMBULATORY_CARE_PROVIDER_SITE_OTHER): Payer: BLUE CROSS/BLUE SHIELD | Admitting: *Deleted

## 2016-06-02 DIAGNOSIS — J309 Allergic rhinitis, unspecified: Secondary | ICD-10-CM

## 2016-06-08 ENCOUNTER — Ambulatory Visit (INDEPENDENT_AMBULATORY_CARE_PROVIDER_SITE_OTHER): Payer: BLUE CROSS/BLUE SHIELD

## 2016-06-08 DIAGNOSIS — J309 Allergic rhinitis, unspecified: Secondary | ICD-10-CM

## 2016-07-05 ENCOUNTER — Ambulatory Visit (INDEPENDENT_AMBULATORY_CARE_PROVIDER_SITE_OTHER): Payer: BLUE CROSS/BLUE SHIELD

## 2016-07-05 DIAGNOSIS — J309 Allergic rhinitis, unspecified: Secondary | ICD-10-CM | POA: Diagnosis not present

## 2016-07-17 ENCOUNTER — Ambulatory Visit (INDEPENDENT_AMBULATORY_CARE_PROVIDER_SITE_OTHER): Payer: BLUE CROSS/BLUE SHIELD | Admitting: *Deleted

## 2016-07-17 DIAGNOSIS — J309 Allergic rhinitis, unspecified: Secondary | ICD-10-CM | POA: Diagnosis not present

## 2016-07-24 ENCOUNTER — Ambulatory Visit (INDEPENDENT_AMBULATORY_CARE_PROVIDER_SITE_OTHER): Payer: BLUE CROSS/BLUE SHIELD

## 2016-07-24 DIAGNOSIS — J309 Allergic rhinitis, unspecified: Secondary | ICD-10-CM | POA: Diagnosis not present

## 2016-08-04 ENCOUNTER — Ambulatory Visit (INDEPENDENT_AMBULATORY_CARE_PROVIDER_SITE_OTHER): Payer: BLUE CROSS/BLUE SHIELD | Admitting: *Deleted

## 2016-08-04 DIAGNOSIS — J309 Allergic rhinitis, unspecified: Secondary | ICD-10-CM

## 2016-08-15 ENCOUNTER — Ambulatory Visit (INDEPENDENT_AMBULATORY_CARE_PROVIDER_SITE_OTHER): Payer: BLUE CROSS/BLUE SHIELD

## 2016-08-15 DIAGNOSIS — J309 Allergic rhinitis, unspecified: Secondary | ICD-10-CM

## 2016-08-24 ENCOUNTER — Ambulatory Visit (INDEPENDENT_AMBULATORY_CARE_PROVIDER_SITE_OTHER): Payer: BLUE CROSS/BLUE SHIELD | Admitting: *Deleted

## 2016-08-24 DIAGNOSIS — J309 Allergic rhinitis, unspecified: Secondary | ICD-10-CM | POA: Diagnosis not present

## 2016-09-11 ENCOUNTER — Ambulatory Visit (INDEPENDENT_AMBULATORY_CARE_PROVIDER_SITE_OTHER): Payer: BLUE CROSS/BLUE SHIELD

## 2016-09-11 DIAGNOSIS — J309 Allergic rhinitis, unspecified: Secondary | ICD-10-CM | POA: Diagnosis not present

## 2016-10-02 ENCOUNTER — Ambulatory Visit (INDEPENDENT_AMBULATORY_CARE_PROVIDER_SITE_OTHER): Payer: BLUE CROSS/BLUE SHIELD | Admitting: *Deleted

## 2016-10-02 DIAGNOSIS — J3089 Other allergic rhinitis: Secondary | ICD-10-CM

## 2016-10-12 ENCOUNTER — Ambulatory Visit (INDEPENDENT_AMBULATORY_CARE_PROVIDER_SITE_OTHER): Payer: BLUE CROSS/BLUE SHIELD | Admitting: *Deleted

## 2016-10-12 DIAGNOSIS — J3089 Other allergic rhinitis: Secondary | ICD-10-CM

## 2016-10-18 ENCOUNTER — Ambulatory Visit (INDEPENDENT_AMBULATORY_CARE_PROVIDER_SITE_OTHER): Payer: BLUE CROSS/BLUE SHIELD

## 2016-10-18 DIAGNOSIS — J309 Allergic rhinitis, unspecified: Secondary | ICD-10-CM | POA: Diagnosis not present

## 2016-10-18 DIAGNOSIS — J3089 Other allergic rhinitis: Secondary | ICD-10-CM

## 2016-10-31 ENCOUNTER — Ambulatory Visit (INDEPENDENT_AMBULATORY_CARE_PROVIDER_SITE_OTHER): Payer: BLUE CROSS/BLUE SHIELD | Admitting: Allergy and Immunology

## 2016-10-31 ENCOUNTER — Encounter (INDEPENDENT_AMBULATORY_CARE_PROVIDER_SITE_OTHER): Payer: Self-pay

## 2016-10-31 ENCOUNTER — Encounter: Payer: Self-pay | Admitting: Allergy and Immunology

## 2016-10-31 ENCOUNTER — Ambulatory Visit: Payer: Self-pay

## 2016-10-31 VITALS — BP 122/70 | HR 54 | Temp 98.2°F | Resp 16 | Ht 71.0 in | Wt 226.2 lb

## 2016-10-31 DIAGNOSIS — T7800XD Anaphylactic reaction due to unspecified food, subsequent encounter: Secondary | ICD-10-CM | POA: Diagnosis not present

## 2016-10-31 DIAGNOSIS — J3089 Other allergic rhinitis: Secondary | ICD-10-CM

## 2016-10-31 DIAGNOSIS — J4530 Mild persistent asthma, uncomplicated: Secondary | ICD-10-CM

## 2016-10-31 DIAGNOSIS — J309 Allergic rhinitis, unspecified: Secondary | ICD-10-CM

## 2016-10-31 NOTE — Progress Notes (Signed)
Follow-up Note  RE: Scott Brandt MRN: [id number] DOB: [birth year] Date of Office Visit: 10/31/2016  Primary care provider: Wilson SingerArvind N Jariwala, MD Referring provider: Wilson SingerJariwala, Arvind N, MD  History of present illness: Ron Parkerric Espinal is a 55 y.o. male with persistent asthma and allergic rhinitis presenting today for follow up.  He was last seen in this clinic on February 2017.  He reports that his asthma has been well-controlled in the interval since his previous visit, however he notes that he has not been doing as much running because of a left femur fracture in March.  He has been receiving aeroallergen immunotherapy buildup injections without problems or complications.  He has no nasal symptom complaints today.   Assessment and plan: Mild persistent asthma in adult without complication  Continue Qvar 80 g, 2 inhalations via spacer device daily. During respiratory tract infections or asthma flares, increase Qvar 80 g   to 2 inhalations 2 times per day until symptoms have returned to baseline.  Continue albuterol HFA, 1-2 inhalations every 4-6 hours as needed.  Perennial and seasonal allergic rhinitis  Continue appropriate allergen avoidance measures, aeroallergen immunotherapy as prescribed and as tolerated, Dymista nasal spray as needed, and fexofenadine as needed.  Allergy with anaphylaxis due to food  Continue careful avoidance of shellfish and have access to epinephrine autoinjector 2 pack in case of accidental ingestion.  Food allergy action plan is in place.   No orders of the defined types were placed in this encounter.   Diagnostics: Spirometry reveals an FVC of 3.54 L (87% predicted) and an FEV1 of 2.51 L (77% predicted).  He has demonstrated full reversibility in the past.  Please see scanned spirometry results for details.    Physical examination: Blood pressure 122/70, pulse (!) 54, temperature 98.2 F (36.8 C), temperature source Oral, resp. rate 16, height 5\' 11"   (1.803 m), weight 226 lb 3.2 oz (102.6 kg), SpO2 96 %.  General: Alert, interactive, in no acute distress. HEENT: TMs pearly gray, turbinates mildly edematous without discharge, post-pharynx mildly erythematous. Neck: Supple without lymphadenopathy. Lungs: Clear to auscultation without wheezing, rhonchi or rales. CV: Normal S1, S2 without murmurs. Skin: Warm and dry, without lesions or rashes.  The following portions of the patient's history were reviewed and updated as appropriate: allergies, current medications, past family history, past medical history, past social history, past surgical history and problem list.    Medication List       Accurate as of 10/31/16  2:04 PM. Always use your most recent med list.          ALPHAGAN P 0.1 % Soln Generic drug:  brimonidine Place 0.1 Bottles into both eyes daily.   aspirin 81 MG tablet Take 81 mg by mouth daily.   Azelastine-Fluticasone 137-50 MCG/ACT Susp Commonly known as:  DYMISTA Place 1 spray into both nostrils 2 (two) times daily.   beclomethasone 80 MCG/ACT inhaler Commonly known as:  QVAR Inhale 1 puff into the lungs 2 (two) times daily.   beclomethasone 80 MCG/ACT inhaler Commonly known as:  QVAR Inhale 2 puffs into the lungs 2 (two) times daily.   docusate sodium 100 MG capsule Commonly known as:  COLACE Take 1 capsule (100 mg total) by mouth 2 (two) times daily.   fexofenadine 180 MG tablet Commonly known as:  ALLEGRA Take 180 mg by mouth as needed.   fluticasone 50 MCG/ACT nasal spray Commonly known as:  FLONASE Place 2 sprays into both nostrils daily as needed.  HYDROcodone-acetaminophen 5-325 MG tablet Commonly known as:  NORCO/VICODIN Take 1-2 tablets by mouth every 6 (six) hours as needed for moderate pain.   LIPITOR PO Take 1 tablet by mouth daily.   atorvastatin 20 MG tablet Commonly known as:  LIPITOR Take 20 mg by mouth every other day.   LUMIGAN 0.01 % Soln Generic drug:   bimatoprost Place 0.01 drops into both ears daily.   methocarbamol 500 MG tablet Commonly known as:  ROBAXIN Take 1 tablet (500 mg total) by mouth every 6 (six) hours as needed for muscle spasms.   mometasone-formoterol 100-5 MCG/ACT Aero Commonly known as:  DULERA Take 2 puffs first thing in am and then another 2 puffs about 12 hours later.   polyethylene glycol packet Commonly known as:  MIRALAX / GLYCOLAX Take 17 g by mouth daily as needed for mild constipation.   PROAIR HFA 108 (90 Base) MCG/ACT inhaler Generic drug:  albuterol Inhale 2 puffs into the lungs every 4 (four) hours as needed.       Allergies  Allergen Reactions  . Shellfish Allergy Anaphylaxis    I appreciate the opportunity to take part in Kaulder's care. Please do not hesitate to contact me with questions.  Sincerely,   R. Jorene Guestarter Aayla Marrocco, MD

## 2016-10-31 NOTE — Assessment & Plan Note (Signed)
   Continue appropriate allergen avoidance measures, aeroallergen immunotherapy as prescribed and as tolerated, Dymista nasal spray as needed, and fexofenadine as needed.

## 2016-10-31 NOTE — Assessment & Plan Note (Signed)
   Continue careful avoidance of shellfish and have access to epinephrine autoinjector 2 pack in case of accidental ingestion.  Food allergy action plan is in place. 

## 2016-10-31 NOTE — Patient Instructions (Signed)
Mild persistent asthma in adult without complication  Continue Qvar 80 g, 2 inhalations via spacer device daily. During respiratory tract infections or asthma flares, increase Qvar 80 g   to 2 inhalations 2 times per day until symptoms have returned to baseline.  Continue albuterol HFA, 1-2 inhalations every 4-6 hours as needed.  Perennial and seasonal allergic rhinitis  Continue appropriate allergen avoidance measures, aeroallergen immunotherapy as prescribed and as tolerated, Dymista nasal spray as needed, and fexofenadine as needed.  Allergy with anaphylaxis due to food  Continue careful avoidance of shellfish and have access to epinephrine autoinjector 2 pack in case of accidental ingestion.  Food allergy action plan is in place.   Return in about 4 months (around 02/28/2017), or if symptoms worsen or fail to improve.

## 2016-10-31 NOTE — Assessment & Plan Note (Signed)
   Continue Qvar 80 g, 2 inhalations via spacer device daily. During respiratory tract infections or asthma flares, increase Qvar 80 g  to 2 inhalations 2 times per day until symptoms have returned to baseline.  Continue albuterol HFA, 1-2 inhalations every 4-6 hours as needed.

## 2016-11-07 ENCOUNTER — Ambulatory Visit: Payer: Self-pay | Admitting: *Deleted

## 2016-11-09 ENCOUNTER — Ambulatory Visit: Payer: BLUE CROSS/BLUE SHIELD | Admitting: Allergy and Immunology

## 2016-11-13 DIAGNOSIS — J3081 Allergic rhinitis due to animal (cat) (dog) hair and dander: Secondary | ICD-10-CM | POA: Diagnosis not present

## 2016-11-14 DIAGNOSIS — J3081 Allergic rhinitis due to animal (cat) (dog) hair and dander: Secondary | ICD-10-CM | POA: Diagnosis not present

## 2016-11-27 ENCOUNTER — Ambulatory Visit (INDEPENDENT_AMBULATORY_CARE_PROVIDER_SITE_OTHER): Payer: BLUE CROSS/BLUE SHIELD | Admitting: *Deleted

## 2016-11-27 DIAGNOSIS — J309 Allergic rhinitis, unspecified: Secondary | ICD-10-CM | POA: Diagnosis not present

## 2016-11-29 ENCOUNTER — Encounter: Payer: Self-pay | Admitting: *Deleted

## 2016-12-06 ENCOUNTER — Ambulatory Visit (INDEPENDENT_AMBULATORY_CARE_PROVIDER_SITE_OTHER): Payer: BLUE CROSS/BLUE SHIELD

## 2016-12-06 DIAGNOSIS — J309 Allergic rhinitis, unspecified: Secondary | ICD-10-CM | POA: Diagnosis not present

## 2016-12-11 ENCOUNTER — Telehealth: Payer: Self-pay

## 2016-12-11 NOTE — Telephone Encounter (Addendum)
[place]  He wants to be skin tested for foods, especially strawberry and all shellfish before the end of the year due to insurance. [facility] did not have any openings until after Dec 27, 2016.  Appointment scheduled for Wednesday. 12/13/16 @ 9:00 AM with Dr[facility]  [facility] [place] [facility] [place] to foods.    Patient feels he is getting a sinus infection and is going to see his primary care doctor today.  He is concerned that this will this interfere with his allergy testing visit if he is put on steroids and antibiotics?  Patient is aware that Dr. Nunzio CobbsBobbitt will not be in clinics until Wednesday, 12/13/16.  He will call on Tuesday if his PCP Rx any meds to make sure he can still be allergy tested or try to reschedule.

## 2016-12-12 ENCOUNTER — Ambulatory Visit: Payer: BLUE CROSS/BLUE SHIELD | Admitting: Pediatrics

## 2016-12-12 NOTE — Telephone Encounter (Signed)
Patient called back.  He was RX Amoxicillin yesterday by PCP for a sinus infection.  Spoke with Dr. Beaulah DinningBardelas.  This is ok to start Amoxicillin and use his nasal steroids.  This should not interfere with his skin testing OV with Dr. Nunzio CobbsBobbitt tomorrow.  NO ANTIHISTAMINES. Patient informed and verbalized understanding.  He will be in tomorrow at 8:45 AM.

## 2016-12-12 NOTE — Telephone Encounter (Signed)
Left message on patient VM this morning to get information on patients PCP visit yesterday and what medications he was prescribed.  We need to know to determine if he can be tested to foods on his OV tomorrow.

## 2016-12-13 ENCOUNTER — Encounter: Payer: Self-pay | Admitting: Allergy and Immunology

## 2016-12-13 ENCOUNTER — Ambulatory Visit (INDEPENDENT_AMBULATORY_CARE_PROVIDER_SITE_OTHER): Payer: BLUE CROSS/BLUE SHIELD | Admitting: Allergy and Immunology

## 2016-12-13 VITALS — BP 128/70 | HR 68 | Resp 16

## 2016-12-13 DIAGNOSIS — J4530 Mild persistent asthma, uncomplicated: Secondary | ICD-10-CM

## 2016-12-13 DIAGNOSIS — T781XXA Other adverse food reactions, not elsewhere classified, initial encounter: Secondary | ICD-10-CM | POA: Diagnosis not present

## 2016-12-13 DIAGNOSIS — J3089 Other allergic rhinitis: Secondary | ICD-10-CM | POA: Diagnosis not present

## 2016-12-13 DIAGNOSIS — T7800XD Anaphylactic reaction due to unspecified food, subsequent encounter: Secondary | ICD-10-CM | POA: Diagnosis not present

## 2016-12-13 MED ORDER — BECLOMETHASONE DIPROPIONATE 80 MCG/ACT IN AERS: 2.0000 | INHALATION_SPRAY | Freq: Two times a day (BID) | RESPIRATORY_TRACT | 5 refills | Status: DC

## 2016-12-13 NOTE — Assessment & Plan Note (Signed)
Today's spirometry results, assessed while asymptomatic, suggest under-perception of bronchoconstriction.  I have recommended Qvar 80 g, 2 inhalations via spacer device twice a day. During respiratory tract infections or asthma flares, increase Qvar 80 g  to 3 inhalations 2 times per day until symptoms have returned to baseline.  Continue albuterol HFA, 1-2 inhalations every 4-6 hours as needed and 15 minutes prior to vigorous exercise.  Subjective and objective measures of pulmonary function will be followed and the treatment plan will be adjusted accordingly.

## 2016-12-13 NOTE — Assessment & Plan Note (Signed)
   Continue meticulous avoidance of shellfish and have access to epinephrine autoinjector 2 pack in case of accidental ingestion.  Food allergy action plan is in place. 

## 2016-12-13 NOTE — Patient Instructions (Addendum)
Oral allergy syndrome Oral allergy syndrome.  The patient's history and skin test results support a diagnosis of oral allergy syndrome (OAS). Peeling or cooking the food has shown to reduce symptoms and antihistamines may also relieve symptoms. Immunotherapy to the cross reacting pollens has improved or cured OAS in many patients, though this has not been consistent for all patients. Typically OAS is limited to itching or swelling of mucosal tissues from the lips to the back of the throat.   Information about OAS has been discussed and provided in written form.  All foods causing symptoms are to be avoided.  Should symptoms progress beyond the mouth and throat, 911 is to be called immediately.  Allergy with anaphylaxis due to food  Continue meticulous avoidance of shellfish and have access to epinephrine autoinjector 2 pack in case of accidental ingestion.  Food allergy action plan is in place.  Mild persistent asthma in adult without complication Today's spirometry results, assessed while asymptomatic, suggest under-perception of bronchoconstriction.  I have recommended Qvar 80 g, 2 inhalations via spacer device twice a day. During respiratory tract infections or asthma flares, increase Qvar 80 g   to 3 inhalations 2 times per day until symptoms have returned to baseline.  Continue albuterol HFA, 1-2 inhalations every 4-6 hours as needed and 15 minutes prior to vigorous exercise.  Subjective and objective measures of pulmonary function will be followed and the treatment plan will be adjusted accordingly.  Perennial and seasonal allergic rhinitis  Continue allergen avoidance measures, aeroallergen immunotherapy as prescribed and as tolerated, Dymista nasal spray as needed, and fexofenadine as needed.  Medications will be decreased or discontinued as symptom reduction from immunotherapy becomes evident.   Return in about 4 months (around 04/13/2017), or if symptoms worsen or fail to  improve.  Oral Allergy Syndrome (OAS)  Oral Allergy Syndrome or OAS is an allergic reaction to certain (usually fresh) fruits, nuts, and vegetables. The allergy is not actually an allergy to food but a syndrome that develops in pollen allergy sufferers. The immune system mistakes the food proteins for the pollen proteins and causes an allergic reaction. For instance, an allergy to ragweed is associated with OAS reactions to banana, watermelon, cantaloupe, honeydew, zucchini, and cucumber. This does not mean that all sufferers of an allergy to ragweed will experience adverse effects from all or even any of these foods. Reactions may begin with one type of food and with reactions to others developing later. However, reaction to one or more foods in any given category does not necessarily mean a person is allergic to all foods in that group. OAS sufferers may have a number of reactions that usually occur very rapidly, within minutes of eating a trigger food. The most common reaction is an itching or burning sensation in the lips, mouth, and/or pharynx. Sometimes other reactions can be triggered in the eyes, nose, and skin. The most severe reactions can result in asthma problems or anaphylaxis.  If a sufferer is able to swallow the food, there is a good chance that there will be a reaction later in the gastrointestinal tract. Vomiting, diarrhea, severe indigestion, or cramps may occur.  Treatment: An OAS sufferer should avoid foods to which they are allergic. Peeling or cooking the food has shown to reduce symptoms in the throat and mouth, but may not relieve symptoms in the gastrointestinal tract. Antihistamines may also relieve the symptoms of the allergy. Persons with severe reactions may consider carrying injectable epinephrine should systemic symptoms occur. Allergy  immunotherapy to the pollens has improved or cured OAS in many patients, though this has not been consistent for all patients. Laban Emperor. Alder pollen:  almonds, apples, celery, cherries, hazel nuts, peaches, pears, parsley, strawberry, raspberry . Birch pollen: almonds, apples, apricots, avocados, bananas, carrots, celery, cherries, chicory, coriander, fennel, fig, hazel nuts, kiwifruit, nectarines, parsley, parsnips, peaches, pears, peppers, plums, potatoes, prunes, soy, strawberries, wheat; Potential: walnuts . Grass pollen: fig, melons, tomatoes, oranges . Mugwort pollen : carrots, celery, coriander, fennel, parsley, peppers, sunflower . Ragweed pollen : banana, cantaloupe, cucumber, green pepper, paprika, sunflower seeds/oil, honeydew, watermelon, zucchini, echinacea, artichoke, dandelions, honey (if bees pollinate from wild flowers), hibiscus or chamomile tea . Possible cross-reactions (to any of the above): berries (strawberries, blueberries, raspberries, etc), citrus (oranges, lemons, etc), grapes, mango, figs, peanut, pineapple, pomegranates, watermelon

## 2016-12-13 NOTE — Telephone Encounter (Signed)
Noted. Thanks.

## 2016-12-13 NOTE — Assessment & Plan Note (Signed)
   Continue allergen avoidance measures, aeroallergen immunotherapy as prescribed and as tolerated, Dymista nasal spray as needed, and fexofenadine as needed.  Medications will be decreased or discontinued as symptom reduction from immunotherapy becomes evident.

## 2016-12-13 NOTE — Assessment & Plan Note (Signed)
Oral allergy syndrome.  The patient's history and skin test results support a diagnosis of oral allergy syndrome (OAS). Peeling or cooking the food has shown to reduce symptoms and antihistamines may also relieve symptoms. Immunotherapy to the cross reacting pollens has improved or cured OAS in many patients, though this has not been consistent for all patients. Typically OAS is limited to itching or swelling of mucosal tissues from the lips to the back of the throat.   Information about OAS has been discussed and provided in written form.  All foods causing symptoms are to be avoided.  Should symptoms progress beyond the mouth and throat, 911 is to be called immediately. 

## 2016-12-13 NOTE — Progress Notes (Signed)
Follow-up Note  RE: Scott Brandt MRN: [id number] DOB: [birth year] Date of Office Visit: 12/13/2016  Primary care provider: Wilson SingerArvind N Jariwala, MD Referring provider: Wilson SingerJariwala, Arvind N, MD  History of present illness: Scott Brandt is a 55 y.o. male with persistent asthma and allergic rhinitis presenting today for follow up and food allergy skin testing.  He was last seen in this clinic on 10/31/2016.  He reports that he has noticed mild oropharyngeal pruritus with the consumption of strawberries.  He does not experience concomitant cutaneous, cardiopulmonary, or other GI symptoms.  He has noticed mild dyspnea and chest tightness over the past few months, particularly with physical exertion.  He admits that he has not been using Qvar as directed.  He has no nasal symptom complaints today.  He is receiving aeroallergen immunotherapy injections without problems or complications.  He avoids shellfish and has access to epinephrine autoinjectors in case of accidental ingestion.   Assessment and plan: Oral allergy syndrome Oral allergy syndrome.  The patient's history and skin test results support a diagnosis of oral allergy syndrome (OAS). Peeling or cooking the food has shown to reduce symptoms and antihistamines may also relieve symptoms. Immunotherapy to the cross reacting pollens has improved or cured OAS in many patients, though this has not been consistent for all patients. Typically OAS is limited to itching or swelling of mucosal tissues from the lips to the back of the throat.   Information about OAS has been discussed and provided in written form.  All foods causing symptoms are to be avoided.  Should symptoms progress beyond the mouth and throat, 911 is to be called immediately.  Allergy with anaphylaxis due to food  Continue meticulous avoidance of shellfish and have access to epinephrine autoinjector 2 pack in case of accidental ingestion.  Food allergy action plan is in place.  Mild  persistent asthma in adult without complication Today's spirometry results, assessed while asymptomatic, suggest under-perception of bronchoconstriction.  I have recommended Qvar 80 g, 2 inhalations via spacer device twice a day. During respiratory tract infections or asthma flares, increase Qvar 80 g   to 3 inhalations 2 times per day until symptoms have returned to baseline.  Continue albuterol HFA, 1-2 inhalations every 4-6 hours as needed and 15 minutes prior to vigorous exercise.  Subjective and objective measures of pulmonary function will be followed and the treatment plan will be adjusted accordingly.  Perennial and seasonal allergic rhinitis  Continue allergen avoidance measures, aeroallergen immunotherapy as prescribed and as tolerated, Dymista nasal spray as needed, and fexofenadine as needed.  Medications will be decreased or discontinued as symptom reduction from immunotherapy becomes evident.   Diagnostics: Food allergen skin testing: Negative to strawberry despite a positive histamine control. Spirometry:  FVC was 3.79 L and FEV1 was 2.62 L (68% predicted) with significant (420 mL, 16%) postbronchodilator improvement while asymptomatic.  Please see scanned spirometry results for details.    Physical examination: Blood pressure 128/70, pulse 68, resp. rate 16.  General: Alert, interactive, in no acute distress. HEENT: TMs pearly gray, turbinates mildly edematous without discharge, post-pharynx unremarkable. Neck: Supple without lymphadenopathy. Lungs: Mildly decreased breath sounds bilaterally without wheezing, rhonchi or rales. CV: Normal S1, S2 without murmurs. Skin: Warm and dry, without lesions or rashes.  The following portions of the patient's history were reviewed and updated as appropriate: allergies, current medications, past family history, past medical history, past social history, past surgical history and problem list.  Allergies as of 12/13/2016  Reactions   Shellfish Allergy Anaphylaxis      Medication List       Accurate as of 12/13/16  6:53 PM. Always use your most recent med list.          ALPHAGAN P 0.1 % Soln Generic drug:  brimonidine Place 0.1 Bottles into both eyes daily.   aspirin 81 MG tablet Take 81 mg by mouth daily.   atorvastatin 20 MG tablet Commonly known as:  LIPITOR Take 20 mg by mouth every other day.   Azelastine-Fluticasone 137-50 MCG/ACT Susp Commonly known as:  DYMISTA Place 1 spray into both nostrils 2 (two) times daily.   beclomethasone 80 MCG/ACT inhaler Commonly known as:  QVAR Inhale 2 puffs into the lungs 2 (two) times daily.   docusate sodium 100 MG capsule Commonly known as:  COLACE Take 1 capsule (100 mg total) by mouth 2 (two) times daily.   fexofenadine 180 MG tablet Commonly known as:  ALLEGRA Take 180 mg by mouth as needed.   fluticasone 50 MCG/ACT nasal spray Commonly known as:  FLONASE Place 2 sprays into both nostrils daily as needed.   HYDROcodone-acetaminophen 5-325 MG tablet Commonly known as:  NORCO/VICODIN Take 1-2 tablets by mouth every 6 (six) hours as needed for moderate pain.   LUMIGAN 0.01 % Soln Generic drug:  bimatoprost Place 0.01 drops into both ears daily.   methocarbamol 500 MG tablet Commonly known as:  ROBAXIN Take 1 tablet (500 mg total) by mouth every 6 (six) hours as needed for muscle spasms.   polyethylene glycol packet Commonly known as:  MIRALAX / GLYCOLAX Take 17 g by mouth daily as needed for mild constipation.   PROAIR HFA 108 (90 Base) MCG/ACT inhaler Generic drug:  albuterol Inhale 2 puffs into the lungs every 4 (four) hours as needed.       Allergies  Allergen Reactions  . Shellfish Allergy Anaphylaxis   Review of systems: Review of systems negative except as noted in HPI / PMHx or noted below: Constitutional: Negative.  HENT: Negative.   Eyes: Negative.  Respiratory: Negative.   Cardiovascular: Negative.    Gastrointestinal: Negative.  Genitourinary: Negative.  Musculoskeletal: Negative.  Neurological: Negative.  Endo/Heme/Allergies: Negative.  Cutaneous: Negative.  Past Medical History:  Diagnosis Date  . Asthma    signs of asthma from Dr Sherene SiresWert   . Asthma   . Hypercholesteremia   . Urticaria     Family History  Problem Relation Age of Onset  . Allergic rhinitis Father   . Angioedema Neg Hx   . Asthma Neg Hx   . Atopy Neg Hx   . Eczema Neg Hx   . Immunodeficiency Neg Hx   . Urticaria Neg Hx     Social History   Social History  . Marital status: Married    Spouse name: N/A  . Number of children: N/A  . Years of education: N/A   Occupational History  . Not on file.   Social History Main Topics  . Smoking status: Never Smoker  . Smokeless tobacco: Never Used  . Alcohol use 0.0 oz/week     Comment: social  . Drug use: No  . Sexual activity: Yes    Partners: Female    Birth control/ protection: None   Other Topics Concern  . Not on file   Social History Narrative   ** Merged History Encounter **        I appreciate the opportunity to take part in Scott Brandt's care. Please  do not hesitate to contact me with questions.  Sincerely,   R. Jorene Guest, MD

## 2016-12-14 NOTE — Addendum Note (Signed)
Addended by: Candis SchatzBOBBITT, Kaisei Gilbo C on: 12/14/2016 05:25 PM   Modules accepted: Orders

## 2016-12-14 NOTE — Addendum Note (Signed)
Addended by: Vincent PeyerKING, MICHELE A on: 12/14/2016 05:23 PM   Modules accepted: Orders

## 2017-01-01 ENCOUNTER — Other Ambulatory Visit: Payer: Self-pay

## 2017-01-01 DIAGNOSIS — J4530 Mild persistent asthma, uncomplicated: Secondary | ICD-10-CM

## 2017-01-01 MED ORDER — BECLOMETHASONE DIPROPIONATE 80 MCG/ACT IN AERS: 2.0000 | INHALATION_SPRAY | Freq: Two times a day (BID) | RESPIRATORY_TRACT | 3 refills | Status: DC

## 2017-01-03 IMAGING — CR DG FEMUR 2+V*L*
4 series · 4 of 4 positions shown · non-contrast
Comparison: Concurrently performed pelvis radiograph.

CLINICAL DATA: Left leg pain after landing wrong playing basketball
and heard a pop. Severe pain in the distal lower leg.

EXAM:
LEFT FEMUR 2 VIEWS

[AP (1 of 2)]
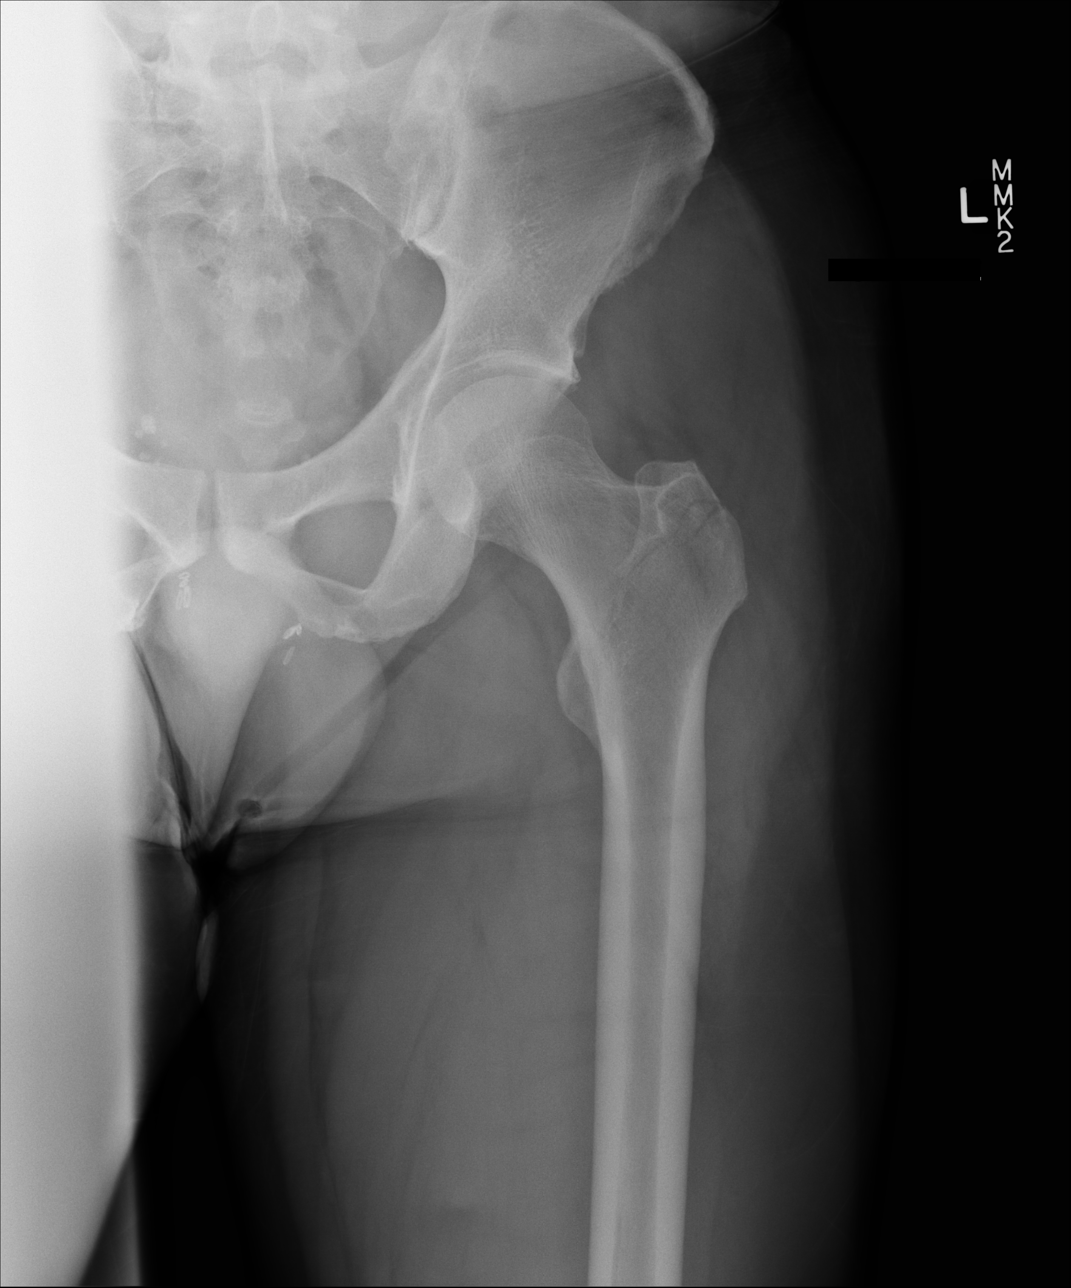

[AP (2 of 2)]
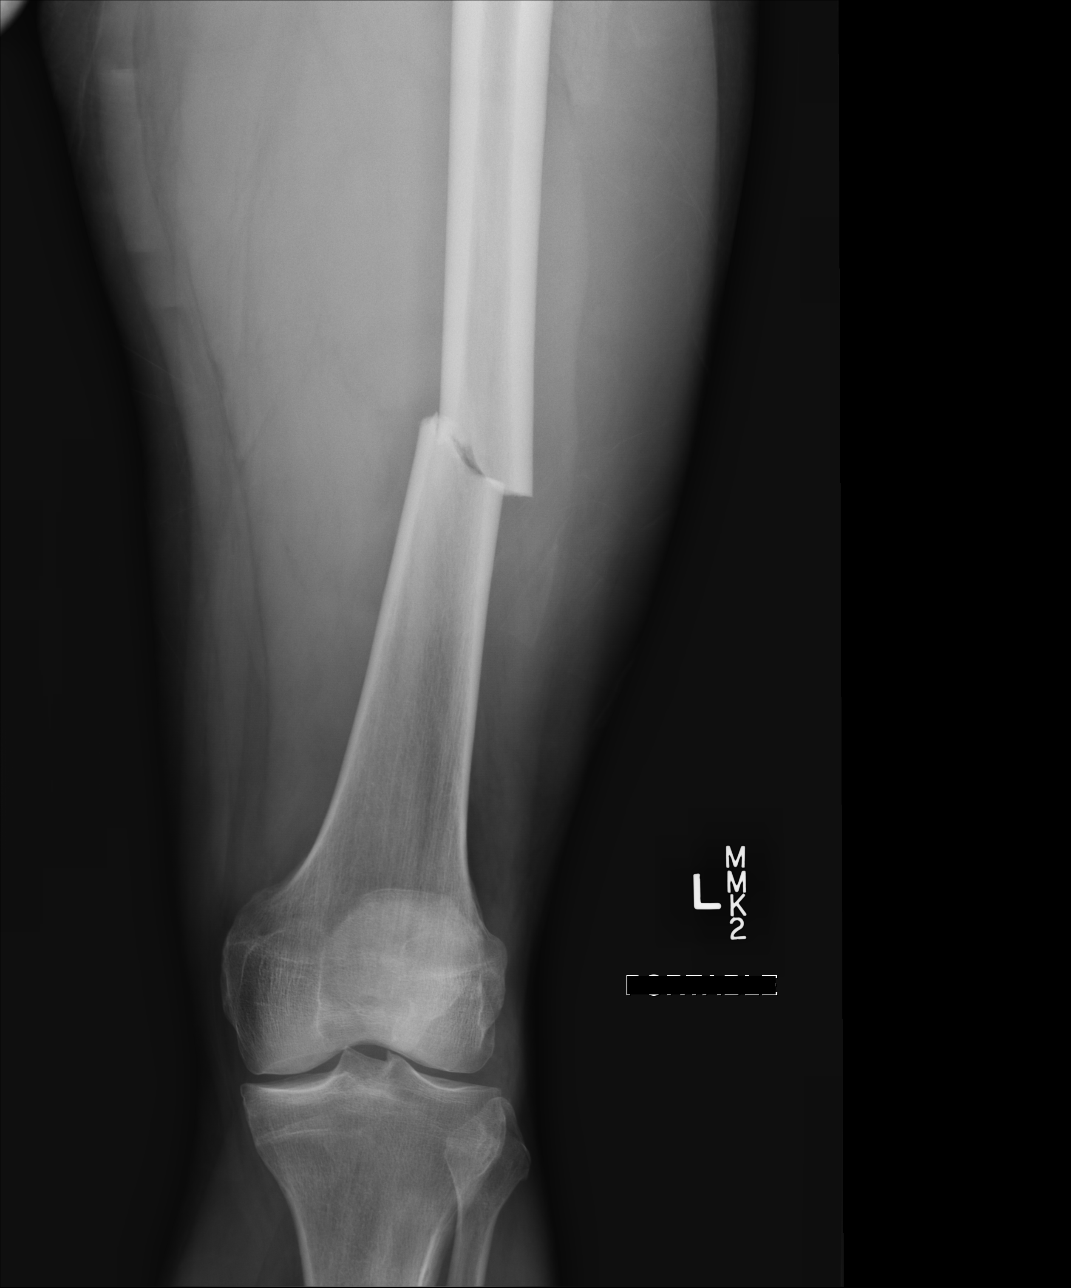

[xtable lateral (1 of 2)]
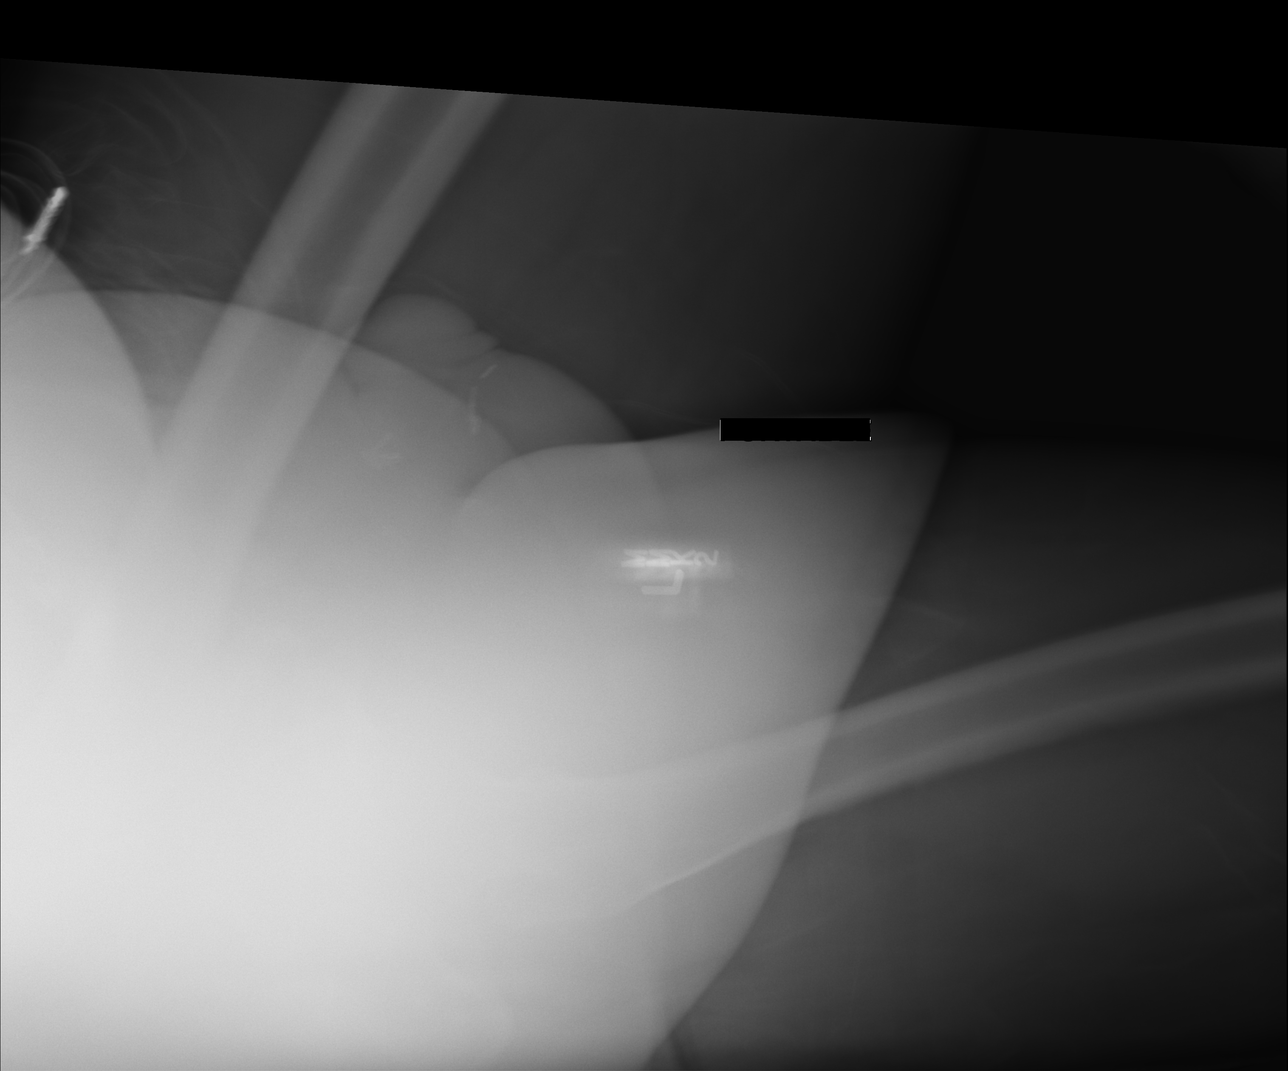

[xtable lateral (2 of 2)]
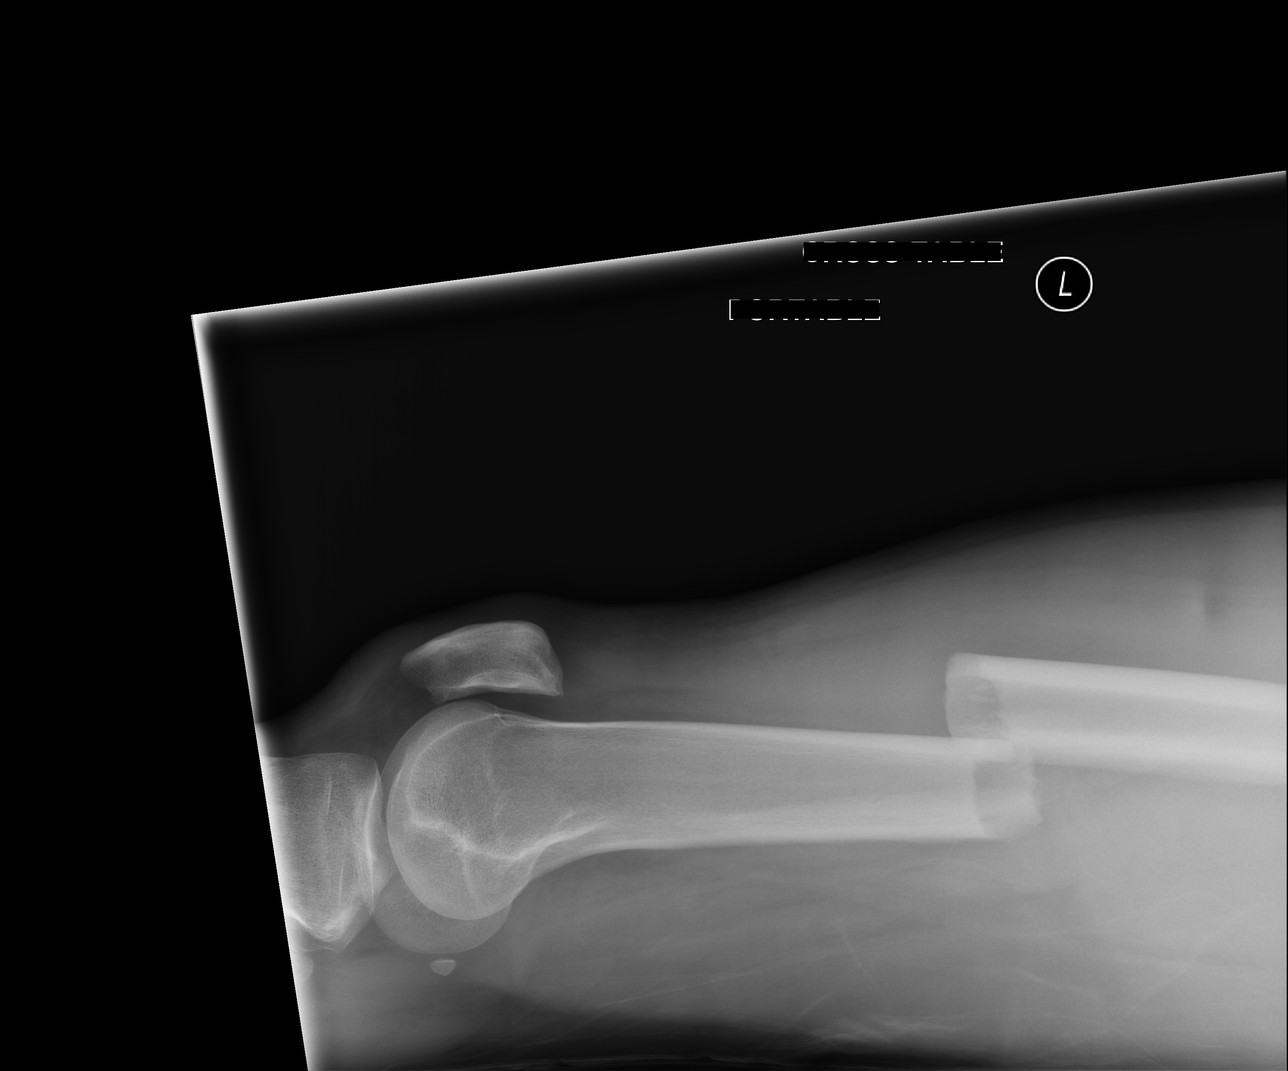

[4 of 4 positions shown; findings below may reference images not displayed]

FINDINGS: Minimally displaced intertrochanteric proximal left femur fracture.
Additionally there is a displaced minimally angulated angulated
oblique fracture of the distal femoral shaft. One shaft with
posterior displacement of distal fragment with minimal osseous
overriding. No extension to the knee joint.
IMPRESSION: 1. Displaced minimally angulated distal femoral shaft fracture.
2. Minimally displaced intertrochanteric fracture.

## 2017-01-03 IMAGING — CR DG PELVIS 1-2V
1 series · 1 of 1 positions shown · non-contrast
Comparison: None.

CLINICAL DATA: Left leg pain after landing wrong playing basketball
hearing a pop.

EXAM:
PELVIS - 1-2 VIEW

[AP]
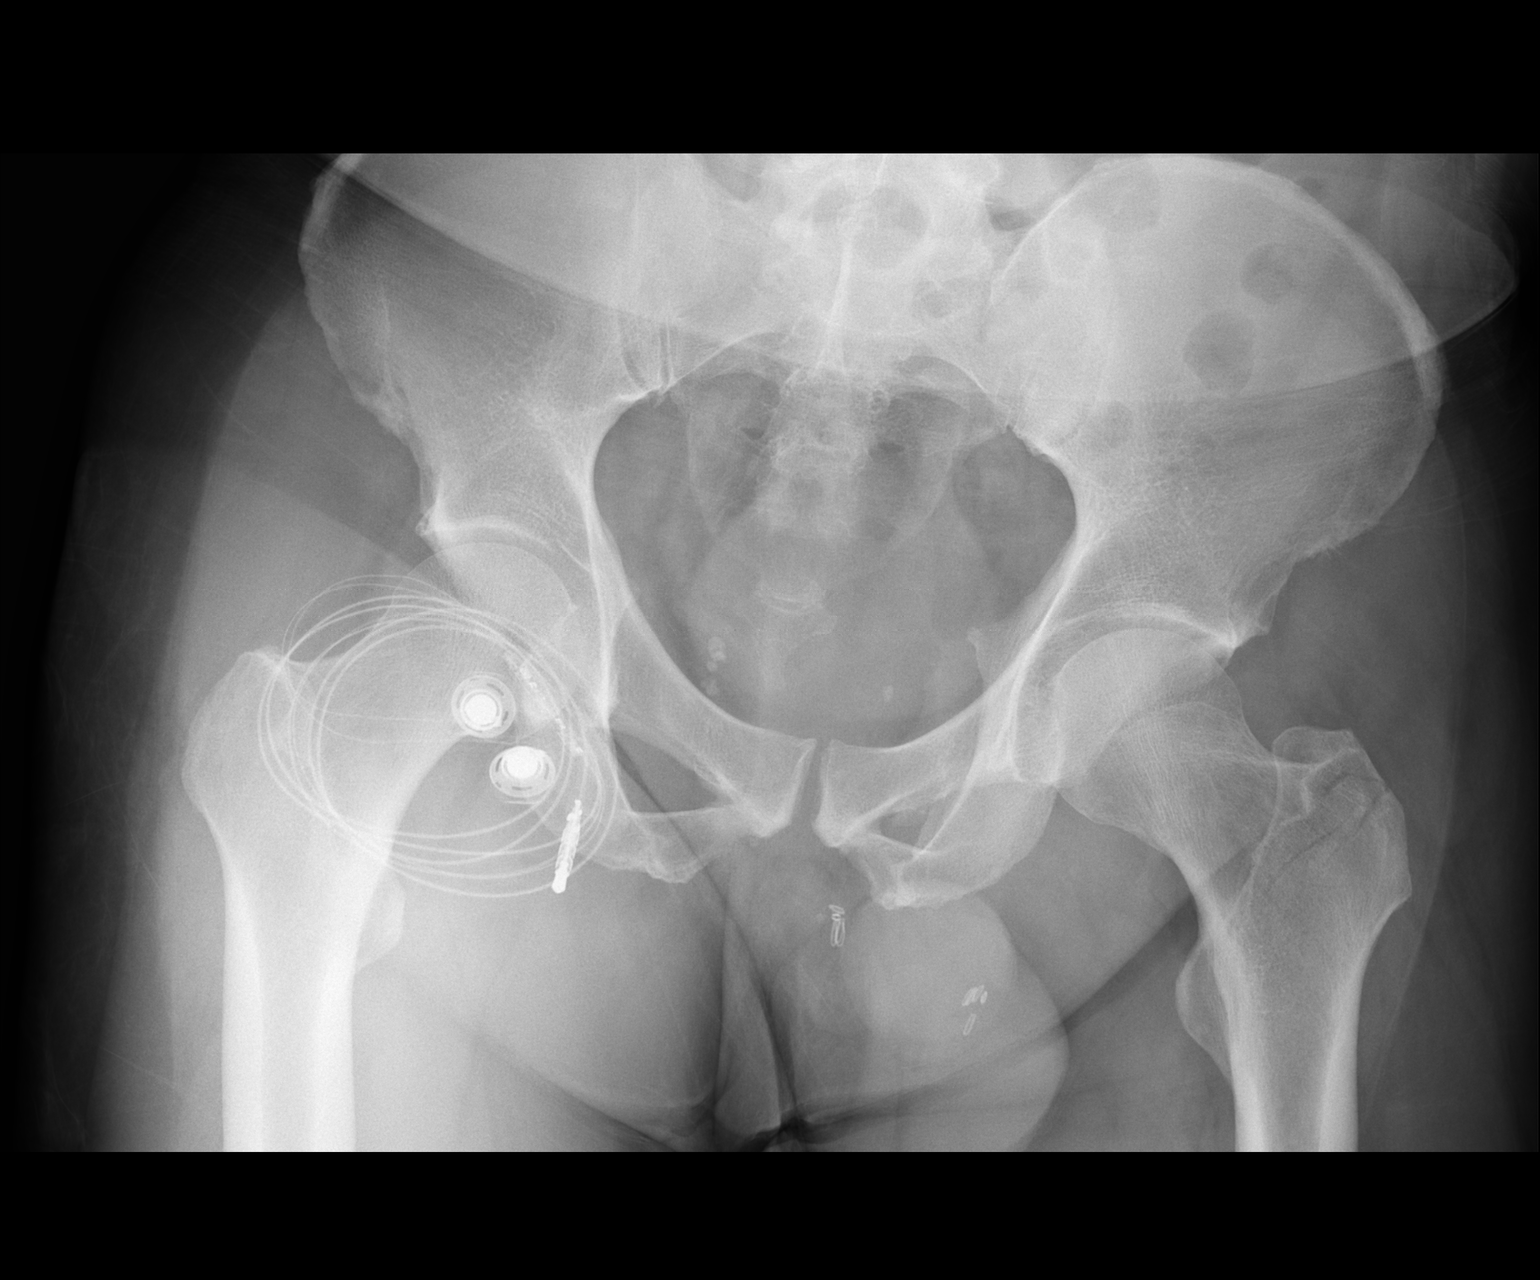

[1 of 1 positions shown; findings below may reference images not displayed]

FINDINGS: Minimally displaced and comminuted intertrochanteric left hip
fracture, with involvement of the greater trochanter. No extension
to the femoral neck. Femoral head remains seated. No additional
fracture of the bony pelvis allowing for patient rotation. Overlying
external artifact over the right proximal femur.
IMPRESSION: Minimally displaced intertrochanteric left proximal femur fracture.

## 2017-02-13 ENCOUNTER — Other Ambulatory Visit: Payer: Self-pay | Admitting: *Deleted

## 2017-02-13 MED ORDER — BECLOMETHASONE DIPROP HFA 80 MCG/ACT IN AERB: 2.0000 | INHALATION_SPRAY | Freq: Two times a day (BID) | RESPIRATORY_TRACT | 3 refills | Status: DC

## 2017-05-07 ENCOUNTER — Telehealth: Payer: Self-pay

## 2017-05-07 NOTE — Telephone Encounter (Signed)
   Use Dymista (or azelastine followed by fluticasone nasal spray) twice daily for now.  Nasal saline lavage (NeilMed) as needed and prior to medicated nasal sprays.  For thick post nasal drainage, nasal congestion, and/or sinus pressure, add guaifenesin 1200 mg (Mucinex Maximum Strength) plus/minus pseudoephedrine 120 mg  twice daily as needed with adequate hydration as discussed. Pseudoephedrine is only to be used for short-term relief of nasal/sinus congestion. Long-term use is discouraged due to potential side effects.

## 2017-05-07 NOTE — Telephone Encounter (Signed)
Patient has been informed and advised. Will call back later and let us know what his symptoms are.

## 2017-05-07 NOTE — Telephone Encounter (Signed)
Patient is calling in c/o nasal congestion, sinus pressure/pain, sneezing, scratchy throat since yesterday. He has taken Allegra, with no relief. Please advise on whether he should come in for visit or needs to do some other meds? He was last seen 11/2016

## 2017-05-07 NOTE — Telephone Encounter (Signed)
Left message for patient to call back  

## 2017-09-27 ENCOUNTER — Telehealth: Payer: Self-pay | Admitting: Cardiovascular Disease

## 2017-09-27 NOTE — Telephone Encounter (Signed)
Received records from [facility] for appointment on 10/22/17 with Dr Duke Salvia.  Records put with Dr Leonides Sake schedule for 10/22/17. lp

## 2017-10-21 NOTE — Progress Notes (Signed)
Cardiology Office Note   Date:  10/22/2017   ID:  RANGEL ECHEVERRI, DOB [birth year], MRN [id number]  PCP:  Wilson Singer, MD  Cardiologist:   Chilton Si, MD   Chief Complaint  Patient presents with  . New Patient (Initial Visit)      History of Present Illness: Scott Brandt is a 56 y.o. male with hyperlipidemia, asthma, and TIA (2007) who presents to establish care.  He denies chest pain or shortness of breath.  His main cardiac complaint is that he feels palpitations.  This typically occurs before stressful situations.  He has a fear of flying.  He is unsure if this is claustrophobia or lack of control.  He also has similar symptoms when getting on and elevator.  He described this to his PCP and was started on lorazepam.  He thinks this has helped somewhat.  The palpitations subside after the stressful stimulus is over.  Lately he has had to fly a lot and the symptoms have been very common.  He exercises 4 times a week.  He runs 2 days and wants to days.  He denies chest pain or palpitations with these symptoms.  Last week he did have one episode of feeling anxious while running.  The symptoms subsided when he stopped and the following day he felt lightheaded throughout the day.  He drinks coffee once per week.  This makes him feel jittery but does not increase the likelihood of him having palpitations.  He has not noted any lower extremity edema, orthopnea, or PND.  His PCP previously started an SSRI but he did not have any improvement in his symptoms after taking it for over 6 months so it was discontinued.  Mr. Schuh sometimes checks his blood pressure at home.  It typically is in the 120s-140s over 70s.    Past Medical History:  Diagnosis Date  . Asthma    signs of asthma from Dr Sherene Sires   . Asthma   . Hypercholesteremia   . TIA (transient ischemic attack) 2007  . Urticaria     Past Surgical History:  Procedure Laterality Date  . FEMUR IM NAIL Left 02/28/2016   Procedure:  INTRAMEDULLARY (IM) NAIL FEMORAL;  Surgeon: Durene Romans, MD;  Location: [facility] OR;  Service: Orthopedics;  Laterality: Left;     Current Outpatient Prescriptions  Medication Sig Dispense Refill  . ALPHAGAN P 0.1 % SOLN Place 0.1 Bottles into both eyes daily.  2  . ALPRAZolam (XANAX) 0.5 MG tablet Take 0.5 mg by mouth at bedtime as needed for anxiety.    Marland Kitchen aspirin 81 MG tablet Take 81 mg by mouth daily.    Marland Kitchen atorvastatin (LIPITOR) 20 MG tablet Take 20 mg by mouth every other day.    . Azelastine-Fluticasone (DYMISTA) 137-50 MCG/ACT SUSP Place 1 spray into both nostrils 2 (two) times daily. 1 Bottle 5  . beclomethasone (QVAR) 80 MCG/ACT inhaler Inhale 2 puffs into the lungs 2 (two) times daily. 1 Inhaler 3  . Beclomethasone Diprop HFA (QVAR REDIHALER) 80 MCG/ACT AERB Inhale 2 puffs into the lungs 2 (two) times daily. 1 Inhaler 3  . Cholecalciferol (D3 ADULT PO) Take by mouth.    Marland Kitchen DHEA 50 MG CAPS Take by mouth.    . docusate sodium (COLACE) 100 MG capsule Take 1 capsule (100 mg total) by mouth 2 (two) times daily. 10 capsule 0  . fexofenadine (ALLEGRA) 180 MG tablet Take 180 mg by mouth as needed.     Marland Kitchen  LUMIGAN 0.01 % SOLN Place 0.01 drops into both ears daily.  2  . Methylcobalamin (B12-ACTIVE PO) Take by mouth.    Marland Kitchen PROAIR HFA 108 (90 BASE) MCG/ACT inhaler Inhale 2 puffs into the lungs every 4 (four) hours as needed.  2  . amLODipine (NORVASC) 2.5 MG tablet Take 1 tablet (2.5 mg total) by mouth daily. 90 tablet 1   No current facility-administered medications for this visit.     Allergies:   Shellfish allergy    Social History:  The patient  reports that he has never smoked. He has never used smokeless tobacco. He reports that he drinks alcohol. He reports that he does not use drugs.   Family History:  The patient's family history includes Allergic rhinitis in his father; Cancer in his sister; Diabetes in his sister; Hyperlipidemia in his brother and mother; Hypertension in his brother,  mother, and sister; Rheum arthritis in his sister; Stroke in his maternal grandmother.    ROS:  Please see the history of present illness.   Otherwise, review of systems are positive for none.   All other systems are reviewed and negative.    PHYSICAL EXAM: VS:  BP (!) 158/97   Pulse (!) 58   Ht 6' (1.829 m)   Wt 102.7 kg (226 lb 6.4 oz)   BMI 30.71 kg/m  , BMI Body mass index is 30.71 kg/m. GENERAL:  Well appearing HEENT:  Pupils equal round and reactive, fundi not visualized, oral mucosa unremarkable NECK:  No jugular venous distention, waveform within normal limits, carotid upstroke brisk and symmetric, no bruits, no thyromegaly LUNGS:  Clear to auscultation bilaterally HEART:  RRR.  PMI not displaced or sustained,S1 and S2 within normal limits, no S3, no S4, no clicks, no rubs, no murmurs ABD:  Flat, positive bowel sounds normal in frequency in pitch, no bruits, no rebound, no guarding, no midline pulsatile mass, no hepatomegaly, no splenomegaly EXT:  2 plus pulses throughout, no edema, no cyanosis no clubbing SKIN:  No rashes no nodules NEURO:  Cranial nerves II through XII grossly intact, motor grossly intact throughout PSYCH:  Cognitively intact, oriented to person place and time.  Slightly anxious.    EKG:  EKG is ordered today. The ekg ordered today demonstrates sinus bradycardia.  Rate 58 bpm.   Recent Labs: No results found for requested labs within last 8760 hours.   05/11/17: Total cholesterol 172, triglycerides 80, HDL 63, LDL 93  Lipid Panel No results found for: CHOL, TRIG, HDL, CHOLHDL, VLDL, LDLCALC, LDLDIRECT    Wt Readings from Last 3 Encounters:  10/22/17 102.7 kg (226 lb 6.4 oz)  10/31/16 102.6 kg (226 lb 3.2 oz)  02/28/16 98.4 kg (217 lb)      ASSESSMENT AND PLAN:  # Palpitations:  Mr.Balan only has palpitations in the setting of stressful symptoms.  This seems much more related to anxiety than a primary cardiac cause.  He does not ever have  palpitations when he is relaxed and feeling well.  I recommended that he follow up with his PCP about anxiety and phobias.  # Elevated blood pressure: Mr. Dooly blood pressure was very elevated today both initially and on repeat.  It is possible that this was due to anxiety.  It typically is well-controlled.  I have given him a prescription for amlodipine 2.5 mg daily.  He will take this today.  We will continue to monitor his blood pressure twice daily.  Address anxiety as above.  # Prevention:  [name] does not have any exertional symptoms.  He is interested in getting a coronary calcium score to better understand his risk.  # Hyperlipidemia: Continue atorvastatin and aspirin.    Current medicines are reviewed at length with the patient today.  The patient does not have concerns regarding medicines.  The following changes have been made:  Amlodipine 2.5mg  daily.   Labs/ tests ordered today include:   Orders Placed This Encounter  Procedures  . CT CARDIAC SCORING  . EKG 12-Lead     Disposition:   FU with Mirren Gest C. Duke Salviaandolph, MD, [facility] in 1 month.     This note was written with the assistance of speech recognition software.  Please excuse any transcriptional errors.  Signed, Karma Ansley C. Duke Salviaandolph, MD, [facility]  10/22/2017 12:51 PM     [facility]

## 2017-10-22 ENCOUNTER — Ambulatory Visit (INDEPENDENT_AMBULATORY_CARE_PROVIDER_SITE_OTHER): Payer: BLUE CROSS/BLUE SHIELD | Admitting: Cardiovascular Disease

## 2017-10-22 ENCOUNTER — Encounter: Payer: Self-pay | Admitting: Cardiovascular Disease

## 2017-10-22 VITALS — BP 158/97 | HR 58 | Ht 72.0 in | Wt 226.4 lb

## 2017-10-22 DIAGNOSIS — E7800 Pure hypercholesterolemia, unspecified: Secondary | ICD-10-CM

## 2017-10-22 DIAGNOSIS — I10 Essential (primary) hypertension: Secondary | ICD-10-CM

## 2017-10-22 DIAGNOSIS — R002 Palpitations: Secondary | ICD-10-CM | POA: Diagnosis not present

## 2017-10-22 MED ORDER — AMLODIPINE BESYLATE 2.5 MG PO TABS: 2.5000 mg | ORAL_TABLET | Freq: Every day | ORAL | 1 refills | Status: DC

## 2017-10-22 NOTE — Patient Instructions (Signed)
Medication Instructions:  START AMLODIPINE 2.5 MG DAILY   Labwork: NONE  Testing/Procedures: CALCIUM SCORE AT [facility], [address] THIS WILL COST $150 OUT OF POCKET   Follow-Up: Your physician recommends that you schedule a follow-up appointment in: 1 MONTH OV  Any Other Special Instructions Will Be Listed Below (If Applicable). MONITOR YOUR BLOOD PRESSURE TWICE A DAY WITH A HOME BLOOD PRESSURE MACHINE. BRING THOSE READINGS TO YOUR FOLLOW UP APPOINTMENT  If you need a refill on your cardiac medications before your next appointment, please call your pharmacy.

## 2017-11-01 ENCOUNTER — Ambulatory Visit (INDEPENDENT_AMBULATORY_CARE_PROVIDER_SITE_OTHER)
Admission: RE | Admit: 2017-11-01 | Discharge: 2017-11-01 | Disposition: A | Discharge status: Home / Self Care | Payer: Self-pay | Source: Ambulatory Visit | Attending: Cardiovascular Disease | Admitting: Cardiovascular Disease

## 2017-11-01 DIAGNOSIS — I10 Essential (primary) hypertension: Secondary | ICD-10-CM

## 2017-11-12 ENCOUNTER — Telehealth: Payer: Self-pay | Admitting: *Deleted

## 2017-11-12 DIAGNOSIS — Z5181 Encounter for therapeutic drug level monitoring: Secondary | ICD-10-CM

## 2017-11-12 DIAGNOSIS — E7800 Pure hypercholesterolemia, unspecified: Secondary | ICD-10-CM

## 2017-11-12 MED ORDER — ATORVASTATIN CALCIUM 20 MG PO TABS: 20.0000 mg | ORAL_TABLET | ORAL | 3 refills | Status: DC

## 2017-11-12 NOTE — Telephone Encounter (Signed)
-----   Message from Chilton Siiffany Waubay, MD sent at 11/05/2017  6:55 AM EST ----- CT-A showed one area of calcification.  He is in the 44th percentile for coronary plaque for his age and gender.  Continue aspirin and would recommend switching atorvastatin to daily to get his cholesterol a little lower.  Check lipid and CMP in 6 weeks.

## 2017-11-12 NOTE — Telephone Encounter (Signed)
Advised patient, mailed lab orders, and refilled Atorvastatin.

## 2017-11-23 ENCOUNTER — Ambulatory Visit: Payer: Self-pay | Admitting: Cardiovascular Disease

## 2017-11-28 ENCOUNTER — Telehealth: Payer: Self-pay | Admitting: Cardiovascular Disease

## 2017-11-28 NOTE — Telephone Encounter (Signed)
Spoke with pt, aware he needs labs in 6 weeks and he is aware he can not do those now because there was a change made to the atorvastatin and it needed to wait for 6 weeks. Patient voiced understanding

## 2017-11-28 NOTE — Telephone Encounter (Signed)
Patient calling, please verify when he needs lab orders.

## 2017-11-30 ENCOUNTER — Encounter: Payer: Self-pay | Admitting: Cardiovascular Disease

## 2017-11-30 ENCOUNTER — Ambulatory Visit (INDEPENDENT_AMBULATORY_CARE_PROVIDER_SITE_OTHER): Payer: BLUE CROSS/BLUE SHIELD | Admitting: Cardiovascular Disease

## 2017-11-30 VITALS — BP 122/86 | HR 61 | Ht 72.0 in | Wt 229.0 lb

## 2017-11-30 DIAGNOSIS — I10 Essential (primary) hypertension: Secondary | ICD-10-CM | POA: Diagnosis not present

## 2017-11-30 DIAGNOSIS — I2510 Atherosclerotic heart disease of native coronary artery without angina pectoris: Secondary | ICD-10-CM | POA: Diagnosis not present

## 2017-11-30 DIAGNOSIS — E7800 Pure hypercholesterolemia, unspecified: Secondary | ICD-10-CM | POA: Diagnosis not present

## 2017-11-30 DIAGNOSIS — R002 Palpitations: Secondary | ICD-10-CM | POA: Diagnosis not present

## 2017-11-30 LAB — COMPREHENSIVE METABOLIC PANEL
ALK PHOS: 84 IU/L (ref 39–117)
ALT: 23 IU/L (ref 0–44)
AST: 25 IU/L (ref 0–40)
Albumin/Globulin Ratio: 1.4 (ref 1.2–2.2)
Albumin: 4.3 g/dL (ref 3.5–5.5)
BUN/Creatinine Ratio: 9 (ref 9–20)
BUN: 11 mg/dL (ref 6–24)
Bilirubin Total: 0.5 mg/dL (ref 0.0–1.2)
CO2: 24 mmol/L (ref 20–29)
CREATININE: 1.16 mg/dL (ref 0.76–1.27)
Calcium: 9.5 mg/dL (ref 8.7–10.2)
Chloride: 102 mmol/L (ref 96–106)
GFR calc Af Amer: 81 mL/min/{1.73_m2} (ref >59)
GFR calc non Af Amer: 70 mL/min/{1.73_m2} (ref >59)
GLUCOSE: 94 mg/dL (ref 65–99)
Globulin, Total: 3 g/dL (ref 1.5–4.5)
Potassium: 4.7 mmol/L (ref 3.5–5.2)
Sodium: 140 mmol/L (ref 134–144)
Total Protein: 7.3 g/dL (ref 6.0–8.5)

## 2017-11-30 LAB — LIPID PANEL
Chol/HDL Ratio: 2.4 ratio (ref 0.0–5.0)
Cholesterol, Total: 166 mg/dL (ref 100–199)
HDL: 70 mg/dL (ref >39)
LDL CALC: 79 mg/dL (ref 0–99)
Triglycerides: 87 mg/dL (ref 0–149)
VLDL CHOLESTEROL CAL: 17 mg/dL (ref 5–40)

## 2017-11-30 NOTE — Progress Notes (Signed)
Cardiology Office Note   Date:  11/30/2017   ID:  Scott Brandt, DOB 07-12-1961, MRN [id number]  PCP:  Scott SingerJariwala, Arvind N, MD  Cardiologist:   Chilton Siiffany , MD   Chief Complaint  Patient presents with  . Follow-up    1 month      History of Present Illness: Scott Brandt is a 56 y.o. male with hyperlipidemia, asthma, anxiety and TIA (2007) who presents for follow up.  He was initially seen 09/2017 to establish care.  He reported palpitations only occur in the setting of stressful situations.  The palpitations subside after the stressful stimulus is over.  He exercises 4 times per week and has no exertional symptoms.  No further testing was performed.  He was very hypertensive in clinic.  It was unclear if this was due to anxiety.  He was started on amlodipine 2.5 mg daily and asked to check his blood pressure at home.  He also had a coronary calcium score that showed one area of calcium in the left anterior descending.  His calcium score was 4, which is 44th percentile for age and gender.  It is recommended that he start taking his atorvastatin daily.  Since his last appointment [name] has been doing well.  He has not noted any chest pain or shortness of breath.  He also has not noted any lower extremity edema.  He does note that he is a little bit stiffer than usual.  He wonders if this is due to not exercising as much or to the change in the weather.  He has been traveling a lot lately and is only been exercising about twice per week.  He checks his blood pressure at home sporadically.  It typically starts in the 140s and improves as he continues to sit and recheck it.  He continues to have palpitations intermittently.  Typically it is when he is feeling somewhat stressed or anxious.  He has no exertional symptoms.  When he gets the palpitations he does not feel lightheaded or dizzy.   Past Medical History:  Diagnosis Date  . Asthma    signs of asthma from Dr Sherene SiresWert   . Asthma   .  Hypercholesteremia   . TIA (transient ischemic attack) 2007  . Urticaria     Past Surgical History:  Procedure Laterality Date  . FEMUR IM NAIL Left 02/28/2016   Procedure: INTRAMEDULLARY (IM) NAIL FEMORAL;  Surgeon: Durene RomansMatthew Olin, MD;  Location: [facility] OR;  Service: Orthopedics;  Laterality: Left;     Current Outpatient Medications  Medication Sig Dispense Refill  . ALPHAGAN P 0.1 % SOLN Place 0.1 Bottles into both eyes daily.  2  . ALPRAZolam (XANAX) 0.5 MG tablet Take 0.5 mg by mouth at bedtime as needed for anxiety.    Marland Kitchen. amLODipine (NORVASC) 2.5 MG tablet Take 1 tablet (2.5 mg total) by mouth daily. 90 tablet 1  . aspirin 81 MG tablet Take 81 mg by mouth daily.    Marland Kitchen. atorvastatin (LIPITOR) 20 MG tablet Take 1 tablet (20 mg total) every other day by mouth. 90 tablet 3  . Azelastine-Fluticasone (DYMISTA) 137-50 MCG/ACT SUSP Place 1 spray into both nostrils 2 (two) times daily. 1 Bottle 5  . beclomethasone (QVAR) 80 MCG/ACT inhaler Inhale 2 puffs into the lungs 2 (two) times daily. 1 Inhaler 3  . Beclomethasone Diprop HFA (QVAR REDIHALER) 80 MCG/ACT AERB Inhale 2 puffs into the lungs 2 (two) times daily. 1 Inhaler 3  .  Cholecalciferol (D3 ADULT PO) Take by mouth.    Marland Kitchen. DHEA 50 MG CAPS Take by mouth.    . fexofenadine (ALLEGRA) 180 MG tablet Take 180 mg by mouth as needed.     Marland Kitchen. LUMIGAN 0.01 % SOLN Place 0.01 drops into both ears daily.  2  . PROAIR HFA 108 (90 BASE) MCG/ACT inhaler Inhale 2 puffs into the lungs every 4 (four) hours as needed.  2   No current facility-administered medications for this visit.     Allergies:   Shellfish allergy    Social History:  The patient  reports that  has never smoked. he has never used smokeless tobacco. He reports that he drinks alcohol. He reports that he does not use drugs.   Family History:  The patient's family history includes Allergic rhinitis in his father; Cancer in his sister; Diabetes in his sister; Hyperlipidemia in his brother and mother;  Hypertension in his brother, mother, and sister; Rheum arthritis in his sister; Stroke in his maternal grandmother.    ROS:  Please see the history of present illness.   Otherwise, review of systems are positive for none.   All other systems are reviewed and negative.    PHYSICAL EXAM: VS:  BP 122/86   Pulse 61   Ht 6' (1.829 m)   Wt 229 lb (103.9 kg)   BMI 31.06 kg/m  , BMI Body mass index is 31.06 kg/m. GENERAL:  Well appearing HEENT: Pupils equal round and reactive, fundi not visualized, oral mucosa unremarkable NECK:  No jugular venous distention, waveform within normal limits, carotid upstroke brisk and symmetric, no bruits, no thyromegaly LUNGS:  Clear to auscultation bilaterally HEART:  RRR.  PMI not displaced or sustained,S1 and S2 within normal limits, no S3, no S4, no clicks, no rubs, no murmurs ABD:  Flat, positive bowel sounds normal in frequency in pitch, no bruits, no rebound, no guarding, no midline pulsatile mass, no hepatomegaly, no splenomegaly EXT:  2 plus pulses throughout, no edema, no cyanosis no clubbing SKIN:  No rashes no nodules NEURO:  Cranial nerves II through XII grossly intact, motor grossly intact throughout PSYCH:  Cognitively intact, oriented to person place and time    EKG:  EKG is not ordered today. The ekg ordered 10/22/17 demonstrates sinus bradycardia.  Rate 58 bpm.   Recent Labs: No results found for requested labs within last 8760 hours.   05/11/17: Total cholesterol 172, triglycerides 80, HDL 63, LDL 93  Lipid Panel No results found for: CHOL, TRIG, HDL, CHOLHDL, VLDL, LDLCALC, LDLDIRECT    Wt Readings from Last 3 Encounters:  11/30/17 229 lb (103.9 kg)  10/22/17 226 lb 6.4 oz (102.7 kg)  10/31/16 226 lb 3.2 oz (102.6 kg)      ASSESSMENT AND PLAN:  # Palpitations:  Symptoms are better lately.  He will call us if they recur to consider an ambulatory monitor.   # Hypertension: BP much better lately.  Continue amlodipine.   #  Asymtpomatic coronary calcification.   # Hyperlipidemia:  Check lipids and CMP.  Goal LDL <70.  He has muscle stiffness and will hold atorvastatin for 2 weeks.  If his stiffness improves he will need to try another medication.  If there is no change he will resume atorvastatin.    Current medicines are reviewed at length with the patient today.  The patient does not have concerns regarding medicines.  The following changes have been made:  none  Labs/ tests ordered today include:  No orders of the defined types were placed in this encounter.    Disposition:   FU with Zyheir Daft C. Duke Salvia, MD, [facility] in 1 year   This note was written with the assistance of speech recognition software.  Please excuse any transcriptional errors.  Signed, Eliese Kerwood C. Duke Salvia, MD, [facility]  11/30/2017 9:02 AM    [facility]

## 2017-11-30 NOTE — Patient Instructions (Signed)
Medication Instructions:  HOLD YOUR ATORVASTATIN FOR 2 WEEKS. IF YOU NOTICE IMPROVEMENT CALL THE OFFICE AT [phone] IF NO IMPROVEMENT RESUME YOUR ATORVASTATIN   Labwork: FASTING LP/CMET   Testing/Procedures: NONE  Follow-Up: Your physician wants you to follow-up in: 1 YEAR OV  You will receive a reminder letter in the mail two months in advance. If you don't receive a letter, please call our office to schedule the follow-up appointment.  If you need a refill on your cardiac medications before your next appointment, please call your pharmacy.

## 2018-05-19 ENCOUNTER — Other Ambulatory Visit: Payer: Self-pay | Admitting: Cardiovascular Disease

## 2018-05-21 NOTE — Telephone Encounter (Signed)
Rx sent to pharmacy   

## 2018-08-19 ENCOUNTER — Encounter: Payer: Self-pay | Admitting: Internal Medicine

## 2018-10-28 ENCOUNTER — Ambulatory Visit (INDEPENDENT_AMBULATORY_CARE_PROVIDER_SITE_OTHER): Payer: BLUE CROSS/BLUE SHIELD | Admitting: Cardiovascular Disease

## 2018-10-28 ENCOUNTER — Encounter: Payer: Self-pay | Admitting: Cardiovascular Disease

## 2018-10-28 VITALS — BP 132/82 | HR 57 | Ht 72.0 in | Wt 233.0 lb

## 2018-10-28 DIAGNOSIS — I2510 Atherosclerotic heart disease of native coronary artery without angina pectoris: Secondary | ICD-10-CM | POA: Diagnosis not present

## 2018-10-28 DIAGNOSIS — E7800 Pure hypercholesterolemia, unspecified: Secondary | ICD-10-CM

## 2018-10-28 DIAGNOSIS — I10 Essential (primary) hypertension: Secondary | ICD-10-CM

## 2018-10-28 DIAGNOSIS — R002 Palpitations: Secondary | ICD-10-CM

## 2018-10-28 NOTE — Progress Notes (Signed)
Cardiology Office Note   Date:  10/28/2018   ID:  Scott Brandt, DOB Jan 09, 1961, MRN [id number]  PCP:  Karle Plumber, MD  Cardiologist:   Chilton Si, MD   No chief complaint on file.     History of Present Illness: Scott Brandt is a 57 y.o. male with hyperlipidemia, asthma, anxiety and TIA (2007) who presents for follow up.  He was initially seen 09/2017 to establish care.  He reported palpitations only occur in the setting of stressful situations.  The palpitations subside after the stressful stimulus is over.  He exercises 4 times per week and has no exertional symptoms.  No further testing was performed.  He was very hypertensive in clinic.  It was unclear if this was due to anxiety.  He was started on amlodipine 2.5 mg daily and asked to check his blood pressure at home.  He also had a coronary calcium score that showed one area of calcium in the left anterior descending.  His calcium score was 4, which is 44th percentile for age and gender.  It is recommended that he start taking his atorvastatin daily.  At his last appointment atorvastatin was held for 2 weeks due to concern it was causing myalgias.  There were no changes in his symptoms and he restarted the atorvastatin as his lipids started to increase.  Since that time he has been feeling well.  He works out from 1 to 3 days/week.  He likes to walk/run, does the elliptical, and lifts weights.  He feels good with exercise and has no chest pain or shortness of breath.  He does sometimes get more short of breath when it is cold outside, which he attributes to his exercise-induced asthma.  He has no lower extremity edema, orthopnea, or PND.  He has been traveling more lately and his diet has been poor.  He notes that he has gained a little bit of weight.  He has not been monitoring his blood pressure at home lately.  He did stop drinking [employer] coffee which she thinks has helped.   Past Medical History:  Diagnosis Date  .  Asthma    signs of asthma from Dr Sherene Sires   . Asthma   . Hypercholesteremia   . TIA (transient ischemic attack) 2007  . Urticaria     Past Surgical History:  Procedure Laterality Date  . FEMUR IM NAIL Left 02/28/2016   Procedure: INTRAMEDULLARY (IM) NAIL FEMORAL;  Surgeon: Durene Romans, MD;  Location: [facility] OR;  Service: Orthopedics;  Laterality: Left;     Current Outpatient Medications  Medication Sig Dispense Refill  . ALPHAGAN P 0.1 % SOLN Place 0.1 Bottles into both eyes daily.  2  . ALPRAZolam (XANAX) 0.5 MG tablet Take 0.5 mg by mouth at bedtime as needed for anxiety.    Marland Kitchen aspirin 81 MG tablet Take 81 mg by mouth every other day.     Marland Kitchen atorvastatin (LIPITOR) 20 MG tablet Take 1 tablet (20 mg total) every other day by mouth. 90 tablet 3  . Azelastine-Fluticasone (DYMISTA) 137-50 MCG/ACT SUSP Place 1 spray into both nostrils 2 (two) times daily. 1 Bottle 5  . Cholecalciferol (D3 ADULT PO) Take by mouth.    . fexofenadine (ALLEGRA) 180 MG tablet Take 180 mg by mouth as needed.     Marland Kitchen LUMIGAN 0.01 % SOLN Place 0.01 drops into both ears daily.  2  . PROAIR HFA 108 (90 BASE) MCG/ACT inhaler Inhale 2 puffs  into the lungs every 4 (four) hours as needed.  2   No current facility-administered medications for this visit.     Allergies:   Shellfish allergy    Social History:  The patient  reports that he has never smoked. He has never used smokeless tobacco. He reports that he drinks alcohol. He reports that he does not use drugs.   Family History:  The patient's family history includes Allergic rhinitis in his father; Cancer in his sister; Diabetes in his sister; Hyperlipidemia in his brother and mother; Hypertension in his brother, mother, and sister; Rheum arthritis in his sister; Stroke in his maternal grandmother.    ROS:  Please see the history of present illness.   Otherwise, review of systems are positive for none.   All other systems are reviewed and negative.    PHYSICAL  EXAM: VS:  BP 132/82   Pulse (!) 57   Ht 6' (1.829 m)   Wt 233 lb (105.7 kg)   BMI 31.60 kg/m  , BMI Body mass index is 31.6 kg/m. GENERAL:  Well appearing HEENT: Pupils equal round and reactive, fundi not visualized, oral mucosa unremarkable NECK:  No jugular venous distention, waveform within normal limits, carotid upstroke brisk and symmetric, no bruits, no thyromegaly LUNGS:  Clear to auscultation bilaterally HEART:  RRR.  PMI not displaced or sustained,S1 and S2 within normal limits, no S3, no S4, no clicks, no rubs, no murmurs ABD:  Flat, positive bowel sounds normal in frequency in pitch, no bruits, no rebound, no guarding, no midline pulsatile mass, no hepatomegaly, no splenomegaly EXT:  2 plus pulses throughout, no edema, no cyanosis no clubbing SKIN:  No rashes no nodules NEURO:  Cranial nerves II through XII grossly intact, motor grossly intact throughout PSYCH:  Cognitively intact, oriented to person place and time  EKG:  EKG is ordered today. The ekg ordered 10/22/17 demonstrates sinus bradycardia.  Rate 58 bpm. 10/28/2018: Sinus bradycardia.  Rate 57 bpm.  Lateral T wave inversions.  Recent Labs: 11/30/2017: ALT 23; BUN 11; Creatinine, Ser 1.16; Potassium 4.7; Sodium 140   05/11/17: Total cholesterol 172, triglycerides 80, HDL 63, LDL 93   Lipid Panel    Component Value Date/Time   CHOL 166 11/30/2017 0856   TRIG 87 11/30/2017 0856   HDL 70 11/30/2017 0856   CHOLHDL 2.4 11/30/2017 0856   LDLCALC 79 11/30/2017 0856      Wt Readings from Last 3 Encounters:  10/28/18 233 lb (105.7 kg)  11/30/17 229 lb (103.9 kg)  10/22/17 226 lb 6.4 oz (102.7 kg)      ASSESSMENT AND PLAN:  # Palpitations: No symptoms lately.  # Hypertension: Mr. Wyndham stopped amlodipine.  His blood pressure was mildly above goal today.  He has gained 4 pounds and is not exercising regularly.  He also has been eating out more.  He is going to work on these things and we will reassess in 4  months.  # Asymtpomatic coronary calcification.   # Hyperlipidemia:  Continue atorvastatin.  Will get lipids from his PCP.   Current medicines are reviewed at length with the patient today.  The patient does not have concerns regarding medicines.  The following changes have been made:  none  Labs/ tests ordered today include:   No orders of the defined types were placed in this encounter.    Disposition:   FU with Kennie Karapetian C. Duke Salvia, MD, [facility] in 4 months.    Signed, Bora Broner C. Duke Salvia, MD,  [facility]  10/28/2018 2:33 PM     [facility]

## 2018-10-28 NOTE — Patient Instructions (Addendum)
Medication Instructions:  Your physician recommends that you continue on your current medications as directed. Please refer to the Current Medication list given to you today.  If you need a refill on your cardiac medications before your next appointment, please call your pharmacy.   Lab work: NONE   Testing/Procedures: NONE  Follow-Up: At [facility], you and your health needs are our priority.  As part of our continuing mission to provide you with exceptional heart care, we have created designated Provider Care Teams.  These Care Teams include your primary Cardiologist (physician) and Advanced Practice Providers (APPs -  Physician Assistants and Nurse Practitioners) who all work together to provide you with the care you need, when you need it. You will need a follow up appointment in 4 months.  You may see DR [facility] or one of the following Advanced Practice Providers on your designated Care Team:   Corine Shelter, PA-C Judy Pimple, [place] . Marjie Skiff, PA-C  Any Other Special Instructions Will Be Listed Below (If Applicable).   YOU NEED TO TRY TO EXERCISE 150 MINUTES EACH WEEK    DASH Eating Plan DASH stands for "Dietary Approaches to Stop Hypertension." The DASH eating plan is a healthy eating plan that has been shown to reduce high blood pressure (hypertension). It may also reduce your risk for type 2 diabetes, heart disease, and stroke. The DASH eating plan may also help with weight loss. What are tips for following this plan? General guidelines  Avoid eating more than 2,300 mg (milligrams) of salt (sodium) a day. If you have hypertension, you may need to reduce your sodium intake to 1,500 mg a day.  Limit alcohol intake to no more than 1 drink a day for nonpregnant women and 2 drinks a day for men. One drink equals 12 oz of beer, 5 oz of wine, or 1 oz of hard liquor.  Work with your health care provider to maintain a healthy body weight or to lose weight. Ask what an  ideal weight is for you.  Get at least 30 minutes of exercise that causes your heart to beat faster (aerobic exercise) most days of the week. Activities may include walking, swimming, or biking.  Work with your health care provider or diet and nutrition specialist (dietitian) to adjust your eating plan to your individual calorie needs. Reading food labels  Check food labels for the amount of sodium per serving. Choose foods with less than 5 percent of the Daily Value of sodium. Generally, foods with less than 300 mg of sodium per serving fit into this eating plan.  To find whole grains, look for the word "whole" as the first word in the ingredient list. Shopping  Buy products labeled as "low-sodium" or "no salt added."  Buy fresh foods. Avoid canned foods and premade or frozen meals. Cooking  Avoid adding salt when cooking. Use salt-free seasonings or herbs instead of table salt or sea salt. Check with your health care provider or pharmacist before using salt substitutes.  Do not fry foods. Cook foods using healthy methods such as baking, boiling, grilling, and broiling instead.  Cook with heart-healthy oils, such as olive, canola, soybean, or sunflower oil. Meal planning   Eat a balanced diet that includes: ? 5 or more servings of fruits and vegetables each day. At each meal, try to fill half of your plate with fruits and vegetables. ? Up to 6-8 servings of whole grains each day. ? Less than 6 oz of lean meat,  poultry, or fish each day. A 3-oz serving of meat is about the same size as a deck of cards. One egg equals 1 oz. ? 2 servings of low-fat dairy each day. ? A serving of nuts, seeds, or beans 5 times each week. ? Heart-healthy fats. Healthy fats called Omega-3 fatty acids are found in foods such as flaxseeds and coldwater fish, like sardines, salmon, and mackerel.  Limit how much you eat of the following: ? Canned or prepackaged foods. ? Food that is high in trans fat, such  as fried foods. ? Food that is high in saturated fat, such as fatty meat. ? Sweets, desserts, sugary drinks, and other foods with added sugar. ? Full-fat dairy products.  Do not salt foods before eating.  Try to eat at least 2 vegetarian meals each week.  Eat more home-cooked food and less restaurant, buffet, and fast food.  When eating at a restaurant, ask that your food be prepared with less salt or no salt, if possible. What foods are recommended? The items listed may not be a complete list. Talk with your dietitian about what dietary choices are best for you. Grains Whole-grain or whole-wheat bread. Whole-grain or whole-wheat pasta. Brown rice. Orpah Cobb. Bulgur. Whole-grain and low-sodium cereals. Pita bread. Low-fat, low-sodium crackers. Whole-wheat flour tortillas. Vegetables Fresh or frozen vegetables (raw, steamed, roasted, or grilled). Low-sodium or reduced-sodium tomato and vegetable juice. Low-sodium or reduced-sodium tomato sauce and tomato paste. Low-sodium or reduced-sodium canned vegetables. Fruits All fresh, dried, or frozen fruit. Canned fruit in natural juice (without added sugar). Meat and other protein foods Skinless chicken or [place]. Ground chicken or [place]. Pork with fat trimmed off. Fish and seafood. Egg whites. Dried beans, peas, or lentils. Unsalted nuts, nut butters, and seeds. Unsalted canned beans. Lean cuts of beef with fat trimmed off. Low-sodium, lean deli meat. Dairy Low-fat (1%) or fat-free (skim) milk. Fat-free, low-fat, or reduced-fat cheeses. Nonfat, low-sodium ricotta or cottage cheese. Low-fat or nonfat yogurt. Low-fat, low-sodium cheese. Fats and oils Soft margarine without trans fats. Vegetable oil. Low-fat, reduced-fat, or light mayonnaise and salad dressings (reduced-sodium). Canola, safflower, olive, soybean, and sunflower oils. Avocado. Seasoning and other foods Herbs. Spices. Seasoning mixes without salt. Unsalted popcorn and pretzels.  Fat-free sweets. What foods are not recommended? The items listed may not be a complete list. Talk with your dietitian about what dietary choices are best for you. Grains Baked goods made with fat, such as croissants, muffins, or some breads. Dry pasta or rice meal packs. Vegetables Creamed or fried vegetables. Vegetables in a cheese sauce. Regular canned vegetables (not low-sodium or reduced-sodium). Regular canned tomato sauce and paste (not low-sodium or reduced-sodium). Regular tomato and vegetable juice (not low-sodium or reduced-sodium). Rosita Fire. Olives. Fruits Canned fruit in a light or heavy syrup. Fried fruit. Fruit in cream or butter sauce. Meat and other protein foods Fatty cuts of meat. Ribs. Fried meat. Tomasa Blase. Sausage. Bologna and other processed lunch meats. Salami. Fatback. Hotdogs. Bratwurst. Salted nuts and seeds. Canned beans with added salt. Canned or smoked fish. Whole eggs or egg yolks. Chicken or [place] with skin. Dairy Whole or 2% milk, cream, and half-and-half. Whole or full-fat cream cheese. Whole-fat or sweetened yogurt. Full-fat cheese. Nondairy creamers. Whipped toppings. Processed cheese and cheese spreads. Fats and oils Butter. Stick margarine. Lard. Shortening. Ghee. Bacon fat. Tropical oils, such as coconut, palm kernel, or palm oil. Seasoning and other foods Salted popcorn and pretzels. Onion salt, garlic salt, seasoned salt, table salt, and  sea salt. Worcestershire sauce. Tartar sauce. Barbecue sauce. Teriyaki sauce. Soy sauce, including reduced-sodium. Steak sauce. Canned and packaged gravies. Fish sauce. Oyster sauce. Cocktail sauce. Horseradish that you find on the shelf. Ketchup. Mustard. Meat flavorings and tenderizers. Bouillon cubes. Hot sauce and Tabasco sauce. Premade or packaged marinades. Premade or packaged taco seasonings. Relishes. Regular salad dressings. Where to find more information:  National Heart, Lung, and Blood Institute:  PopSteam.is  American Heart Association: www.heart.org Summary  The DASH eating plan is a healthy eating plan that has been shown to reduce high blood pressure (hypertension). It may also reduce your risk for type 2 diabetes, heart disease, and stroke.  With the DASH eating plan, you should limit salt (sodium) intake to 2,300 mg a day. If you have hypertension, you may need to reduce your sodium intake to 1,500 mg a day.  When on the DASH eating plan, aim to eat more fresh fruits and vegetables, whole grains, lean proteins, low-fat dairy, and heart-healthy fats.  Work with your health care provider or diet and nutrition specialist (dietitian) to adjust your eating plan to your individual calorie needs. This information is not intended to replace advice given to you by your health care provider. Make sure you discuss any questions you have with your health care provider. Document Released: 11/30/2011 Document Revised: 12/04/2016 Document Reviewed: 12/04/2016 Elsevier Interactive Patient Education  [employer].

## 2018-12-16 ENCOUNTER — Encounter: Payer: Self-pay | Admitting: Cardiovascular Disease

## 2019-02-24 ENCOUNTER — Encounter: Payer: Self-pay | Admitting: Cardiovascular Disease

## 2019-02-24 ENCOUNTER — Ambulatory Visit (INDEPENDENT_AMBULATORY_CARE_PROVIDER_SITE_OTHER): Payer: 59 | Admitting: Cardiovascular Disease

## 2019-02-24 VITALS — BP 150/92 | HR 68 | Ht 72.0 in | Wt 227.4 lb

## 2019-02-24 DIAGNOSIS — Z5181 Encounter for therapeutic drug level monitoring: Secondary | ICD-10-CM | POA: Diagnosis not present

## 2019-02-24 DIAGNOSIS — I2510 Atherosclerotic heart disease of native coronary artery without angina pectoris: Secondary | ICD-10-CM | POA: Diagnosis not present

## 2019-02-24 DIAGNOSIS — E7800 Pure hypercholesterolemia, unspecified: Secondary | ICD-10-CM | POA: Diagnosis not present

## 2019-02-24 DIAGNOSIS — I10 Essential (primary) hypertension: Secondary | ICD-10-CM

## 2019-02-24 NOTE — Patient Instructions (Signed)
Medication Instructions:  Your physician recommends that you continue on your current medications as directed. Please refer to the Current Medication list given to you today.  If you need a refill on your cardiac medications before your next appointment, please call your pharmacy.   Lab work: FASTING LP/CMET SOON   If you have labs (blood work) drawn today and your tests are completely normal, you will receive your results only by: Marland Kitchen MyChart Message (if you have MyChart) OR . A paper copy in the mail If you have any lab test that is abnormal or we need to change your treatment, we will call you to review the results.  Testing/Procedures: NONE  Follow-Up: At [facility], you and your health needs are our priority.  As part of our continuing mission to provide you with exceptional heart care, we have created designated Provider Care Teams.  These Care Teams include your primary Cardiologist (physician) and Advanced Practice Providers (APPs -  Physician Assistants and Nurse Practitioners) who all work together to provide you with the care you need, when you need it. You will need a follow up appointment in 9 months.  Please call our office 2 months in advance to schedule this appointment.  You may see DR [facility] or one of the following Advanced Practice Providers on your designated Care Team:   Corine Shelter, PA-C Judy Pimple, [place] . Marjie Skiff, PA-C

## 2019-02-24 NOTE — Progress Notes (Signed)
Cardiology Office Note   Date:  02/24/2019   ID:  Scott Brandt, DOB 06-15-1961, MRN [id number]  PCP:  Scott Plumber, MD  Cardiologist:   Scott Si, MD   No chief complaint on file.     History of Present Illness: Scott Brandt is a 58 y.o. male with hyperlipidemia, asthma, anxiety and TIA (2007) who presents for follow up.  He was initially seen 09/2017 to establish care.  He reported palpitations only occur in the setting of stressful situations.  The palpitations subside after the stressful stimulus is over.  He exercises 4 times per week and has no exertional symptoms.  No further testing was performed.  He was very hypertensive in clinic.  It was unclear if this was due to anxiety.  He was started on amlodipine 2.5 mg daily and asked to check his blood pressure at home.  He also had a coronary calcium score that showed one area of calcium in the left anterior descending.  His calcium score was 4, which is 44th percentile for age and gender.  It is recommended that he start taking his atorvastatin daily.  At his last appointment Scott Brandt was asked to track his BP due to an elevated in office reading.  He hasn't been checking it at home, but has been seen by the doctor and it has been controlled.  It was 114/79 with his PCP last month.  He has been feeling well and has no exertional chest pain or shortness of breath.  He also denies lower extremity edema, orthopnea or PND.  He has been trying to exercise 40 minutes most days.  His main complaint is sinus congestion and he has been taking Allegra-D for that.  His diet has been generally well-controlled and he is trying to limit EtOH beverages in order to lose weight.    Past Medical History:  Diagnosis Date  . Asthma    signs of asthma from Dr Sherene Sires   . Asthma   . Hypercholesteremia   . TIA (transient ischemic attack) 2007  . Urticaria     Past Surgical History:  Procedure Laterality Date  . FEMUR IM NAIL Left 02/28/2016   Procedure: INTRAMEDULLARY (IM) NAIL FEMORAL;  Surgeon: Durene Romans, MD;  Location: [facility] OR;  Service: Orthopedics;  Laterality: Left;     Current Outpatient Medications  Medication Sig Dispense Refill  . ALPHAGAN P 0.1 % SOLN Place 0.1 Bottles into both eyes daily.  2  . ALPRAZolam (XANAX) 0.5 MG tablet Take 0.5 mg by mouth at bedtime as needed for anxiety.    Marland Kitchen aspirin 81 MG tablet Take 81 mg by mouth every other day.     Marland Kitchen atorvastatin (LIPITOR) 20 MG tablet Take 1 tablet (20 mg total) every other day by mouth. 90 tablet 3  . Azelastine-Fluticasone (DYMISTA) 137-50 MCG/ACT SUSP Place 1 spray into both nostrils 2 (two) times daily. 1 Bottle 5  . Cholecalciferol (D3 ADULT PO) Take by mouth.    . fexofenadine (ALLEGRA) 180 MG tablet Take 180 mg by mouth as needed.     Marland Kitchen LUMIGAN 0.01 % SOLN Place 0.01 drops into both ears daily.  2  . PROAIR HFA 108 (90 BASE) MCG/ACT inhaler Inhale 2 puffs into the lungs every 4 (four) hours as needed.  2   No current facility-administered medications for this visit.     Allergies:   Shellfish allergy    Social History:  The patient  reports that  he has never smoked. He has never used smokeless tobacco. He reports current alcohol use. He reports that he does not use drugs.   Family History:  The patient's family history includes Allergic rhinitis in his father; Cancer in his sister; Diabetes in his sister; Hyperlipidemia in his brother and mother; Hypertension in his brother, mother, and sister; Rheum arthritis in his sister; Stroke in his maternal grandmother.    ROS:  Please see the history of present illness.   Otherwise, review of systems are positive for none.   All other systems are reviewed and negative.    PHYSICAL EXAM: VS:  BP (!) 150/92   Pulse 68   Ht 6' (1.829 m)   Wt 227 lb 6.4 oz (103.1 kg)   BMI 30.84 kg/m  , BMI Body mass index is 30.84 kg/m. GENERAL:  Well appearing HEENT: Pupils equal round and reactive, fundi not visualized,  oral mucosa unremarkable NECK:  No jugular venous distention, waveform within normal limits, carotid upstroke brisk and symmetric, no bruits, no thyromegaly LUNGS:  Clear to auscultation bilaterally HEART:  RRR.  PMI not displaced or sustained,S1 and S2 within normal limits, no S3, no S4, no clicks, no rubs, no murmurs ABD:  Flat, positive bowel sounds normal in frequency in pitch, no bruits, no rebound, no guarding, no midline pulsatile mass, no hepatomegaly, no splenomegaly EXT:  2 plus pulses throughout, no edema, no cyanosis no clubbing SKIN:  No rashes no nodules NEURO:  Cranial nerves II through XII grossly intact, motor grossly intact throughout PSYCH:  Cognitively intact, oriented to person place and time  EKG:  EKG is not ordered today. The ekg ordered 10/22/17 demonstrates sinus bradycardia.  Rate 58 bpm. 10/28/2018: Sinus bradycardia.  Rate 57 bpm.  Lateral T wave inversions.  Recent Labs: No results found for requested labs within last 8760 hours.   05/11/17: Total cholesterol 172, triglycerides 80, HDL 63, LDL 93   Lipid Panel    Component Value Date/Time   CHOL 166 11/30/2017 0856   TRIG 87 11/30/2017 0856   HDL 70 11/30/2017 0856   CHOLHDL 2.4 11/30/2017 0856   LDLCALC 79 11/30/2017 0856      Wt Readings from Last 3 Encounters:  02/24/19 227 lb 6.4 oz (103.1 kg)  10/28/18 233 lb (105.7 kg)  11/30/17 229 lb (103.9 kg)      ASSESSMENT AND PLAN:   # Hypertension: Scott Brandt stopped amlodipine.  He is trying to control his BP with diet and exercise.  It has been controlled with other doctors and at home.  I suspect that Allegra-D is contributing and he thinks he has white coat hypertension in our office.  Continue to monitor.  I encouraged him to check more regularly at home.  His goal is <130/80.  Keep working on diet and exercise.   # Palpitations: Stable.  No symptoms lately.  # Asymtpomatic coronary calcification.   # Hyperlipidemia:  Continue atorvastatin  and aspirin.  Check lipids/CMP.    Current medicines are reviewed at length with the patient today.  The patient does not have concerns regarding medicines.  The following changes have been made:  none  Labs/ tests ordered today include:   Orders Placed This Encounter  Procedures  . Lipid panel  . Comprehensive metabolic panel     Disposition:   FU with Dessiree Sze C. Duke Salvia, MD, [facility] 11/2019.    Signed, Lorrayne Ismael C. Duke Salvia, MD, [facility]  02/24/2019 10:22 AM    [facility] 

## 2019-06-20 LAB — LIPID PANEL
CHOL/HDL RATIO: 2.1 ratio (ref 0.0–5.0)
CHOLESTEROL TOTAL: 146 mg/dL (ref 100–199)
HDL: 71 mg/dL (ref >39)
LDL Calculated: 62 mg/dL (ref 0–99)
Triglycerides: 66 mg/dL (ref 0–149)
VLDL CHOLESTEROL CAL: 13 mg/dL (ref 5–40)

## 2019-06-20 LAB — COMPREHENSIVE METABOLIC PANEL
ALT: 24 IU/L (ref 0–44)
AST: 32 IU/L (ref 0–40)
Albumin/Globulin Ratio: 1.6 (ref 1.2–2.2)
Albumin: 4.2 g/dL (ref 3.8–4.9)
Alkaline Phosphatase: 86 IU/L (ref 39–117)
BUN / CREAT RATIO: 12 (ref 9–20)
BUN: 13 mg/dL (ref 6–24)
Bilirubin Total: 0.5 mg/dL (ref 0.0–1.2)
CO2: 22 mmol/L (ref 20–29)
CREATININE: 1.13 mg/dL (ref 0.76–1.27)
Calcium: 9.4 mg/dL (ref 8.7–10.2)
Chloride: 102 mmol/L (ref 96–106)
GFR calc Af Amer: 82 mL/min/{1.73_m2} (ref >59)
GFR calc non Af Amer: 71 mL/min/{1.73_m2} (ref >59)
GLOBULIN, TOTAL: 2.6 g/dL (ref 1.5–4.5)
GLUCOSE: 89 mg/dL (ref 65–99)
POTASSIUM: 4.5 mmol/L (ref 3.5–5.2)
SODIUM: 139 mmol/L (ref 134–144)
Total Protein: 6.8 g/dL (ref 6.0–8.5)

## 2019-07-11 ENCOUNTER — Other Ambulatory Visit: Payer: Self-pay | Admitting: Internal Medicine

## 2019-07-11 DIAGNOSIS — Z20822 Contact with and (suspected) exposure to covid-19: Secondary | ICD-10-CM

## 2019-07-16 LAB — NOVEL CORONAVIRUS, NAA: SARS-CoV-2, NAA: NOT DETECTED

## 2019-12-09 DIAGNOSIS — H04123 Dry eye syndrome of bilateral lacrimal glands: Secondary | ICD-10-CM | POA: Diagnosis not present

## 2019-12-09 DIAGNOSIS — H43811 Vitreous degeneration, right eye: Secondary | ICD-10-CM | POA: Diagnosis not present

## 2019-12-09 DIAGNOSIS — H401132 Primary open-angle glaucoma, bilateral, moderate stage: Secondary | ICD-10-CM | POA: Diagnosis not present

## 2019-12-09 DIAGNOSIS — H2513 Age-related nuclear cataract, bilateral: Secondary | ICD-10-CM | POA: Diagnosis not present

## 2019-12-30 DIAGNOSIS — Z20828 Contact with and (suspected) exposure to other viral communicable diseases: Secondary | ICD-10-CM | POA: Diagnosis not present

## 2020-01-05 DIAGNOSIS — Z03818 Encounter for observation for suspected exposure to other biological agents ruled out: Secondary | ICD-10-CM | POA: Diagnosis not present

## 2020-01-28 ENCOUNTER — Ambulatory Visit (INDEPENDENT_AMBULATORY_CARE_PROVIDER_SITE_OTHER): Payer: Self-pay | Admitting: Cardiovascular Disease

## 2020-01-28 ENCOUNTER — Encounter: Payer: Self-pay | Admitting: Cardiovascular Disease

## 2020-01-28 ENCOUNTER — Encounter (INDEPENDENT_AMBULATORY_CARE_PROVIDER_SITE_OTHER): Payer: Self-pay

## 2020-01-28 ENCOUNTER — Other Ambulatory Visit: Payer: Self-pay

## 2020-01-28 VITALS — BP 156/90 | HR 60 | Temp 97.1°F | Ht 72.0 in | Wt 227.3 lb

## 2020-01-28 DIAGNOSIS — I10 Essential (primary) hypertension: Secondary | ICD-10-CM

## 2020-01-28 DIAGNOSIS — E7800 Pure hypercholesterolemia, unspecified: Secondary | ICD-10-CM

## 2020-01-28 MED ORDER — AMLODIPINE BESYLATE 5 MG PO TABS: 5.0000 mg | ORAL_TABLET | Freq: Every day | ORAL | 1 refills | Status: DC

## 2020-01-28 NOTE — Progress Notes (Signed)
Cardiology Office Note   Date:  01/28/2020   ID:  AUGUST LONGEST, DOB 09/13/1961, MRN [id number]  PCP:  Karle Plumber, MD  Cardiologist:   Chilton Si, MD   No chief complaint on file.    History of Present Illness: Scott Brandt is a 59 y.o. male with hyperlipidemia, asthma, anxiety and TIA (2007) who presents for follow up.  He was initially seen 09/2017 to establish care.  He reported palpitations only occur in the setting of stressful situations.  The palpitations subside after the stressful stimulus is over.  He exercises 4 times per week and has no exertional symptoms.  No further testing was performed.  He was very hypertensive in clinic.  It was unclear if this was due to anxiety.  He was started on amlodipine 2.5 mg daily and asked to check his blood pressure at home.  He also had a coronary calcium score that showed one area of calcium in the left anterior descending.  His calcium score was 4, which is 44th percentile for age and gender.  It was recommended that he start taking his atorvastatin daily.  At his last appointment Scott Brandt stopped his amlodipine and was trying to control his BP with diet and exercise.  However his blood pressure has been quite elevated.  He does not check it regularly at home.  He has been feeling well physically.  He continues to exercise by walking at least 5 or 6 days/week for a minimum of 4 miles.  He walks at a 14 to 15-minute mile pace.  He has no exertional symptoms.  He reports that his diet has been reasonable.  He does not follow any particular sodium restriction but tries to have a mostly healthy diet.  He denies any lower extremity edema, orthopnea, or PND.   Past Medical History:  Diagnosis Date  . Asthma    signs of asthma from Dr Sherene Sires   . Asthma   . Hypercholesteremia   . TIA (transient ischemic attack) 2007  . Urticaria     Past Surgical History:  Procedure Laterality Date  . FEMUR IM NAIL Left 02/28/2016   Procedure:  INTRAMEDULLARY (IM) NAIL FEMORAL;  Surgeon: Durene Romans, MD;  Location: [facility] OR;  Service: Orthopedics;  Laterality: Left;     Current Outpatient Medications  Medication Sig Dispense Refill  . ALPRAZolam (XANAX) 0.5 MG tablet Take 0.5 mg by mouth at bedtime as needed for anxiety.    Marland Kitchen aspirin 81 MG tablet Take 81 mg by mouth every other day.     Marland Kitchen atorvastatin (LIPITOR) 20 MG tablet Take 1 tablet (20 mg total) every other day by mouth. 90 tablet 3  . Cholecalciferol (D3 ADULT PO) Take by mouth.    . fexofenadine (ALLEGRA) 180 MG tablet Take 180 mg by mouth as needed.     . fluticasone (FLONASE) 50 MCG/ACT nasal spray fluticasone propionate 50 mcg/actuation nasal spray,suspension    . amLODipine (NORVASC) 5 MG tablet Take 1 tablet (5 mg total) by mouth daily. 90 tablet 1   No current facility-administered medications for this visit.    Allergies:   Shellfish allergy    Social History:  The patient  reports that he has never smoked. He has never used smokeless tobacco. He reports current alcohol use. He reports that he does not use drugs.   Family History:  The patient's family history includes Allergic rhinitis in his father; Cancer in his sister; Diabetes in his  sister; Hyperlipidemia in his brother and mother; Hypertension in his brother, mother, and sister; Rheum arthritis in his sister; Stroke in his maternal grandmother.    ROS:  Please see the history of present illness.   Otherwise, review of systems are positive for none.   All other systems are reviewed and negative.    PHYSICAL EXAM: VS:  BP (!) 156/90   Pulse 60   Temp (!) 97.1 F (36.2 C)   Ht 6' (1.829 m)   Wt 227 lb 4.8 oz (103.1 kg)   SpO2 98%   BMI 30.83 kg/m  , BMI Body mass index is 30.83 kg/m. GENERAL:  Well appearing HEENT: Pupils equal round and reactive, fundi not visualized, oral mucosa unremarkable NECK:  No jugular venous distention, waveform within normal limits, carotid upstroke brisk and symmetric,  no bruits, no thyromegaly LUNGS:  Clear to auscultation bilaterally HEART:  RRR.  PMI not displaced or sustained,S1 and S2 within normal limits, +S3, no S4, no clicks, no rubs, no murmurs ABD:  Flat, positive bowel sounds normal in frequency in pitch, no bruits, no rebound, no guarding, no midline pulsatile mass, no hepatomegaly, no splenomegaly EXT:  2 plus pulses throughout, no edema, no cyanosis no clubbing SKIN:  No rashes no nodules NEURO:  Cranial nerves II through XII grossly intact, motor grossly intact throughout PSYCH:  Cognitively intact, oriented to person place and time   EKG:  EKG is ordered today. The ekg ordered 10/22/17 demonstrates sinus bradycardia.  Rate 58 bpm. 10/28/2018: Sinus bradycardia.  Rate 57 bpm.  Lateral T wave inversions. 01/28/2020: Sinus bradycardia.  Rate 56 bpm.  Lateral T wave inversions.  Recent Labs: 06/20/2019: ALT 24; BUN 13; Creatinine, Ser 1.13; Potassium 4.5; Sodium 139   05/11/17: Total cholesterol 172, triglycerides 80, HDL 63, LDL 93   Lipid Panel    Component Value Date/Time   CHOL 146 06/20/2019 0925   TRIG 66 06/20/2019 0925   HDL 71 06/20/2019 0925   CHOLHDL 2.1 06/20/2019 0925   LDLCALC 62 06/20/2019 0925      Wt Readings from Last 3 Encounters:  01/28/20 227 lb 4.8 oz (103.1 kg)  02/24/19 227 lb 6.4 oz (103.1 kg)  10/28/18 233 lb (105.7 kg)      ASSESSMENT AND PLAN:  # Hypertension: Scott Brandt stopped amlodipine.  However his blood pressures not well-controlled with diet and exercise.  We discussed the fact that with age, despite trying to maintain a healthy weight and exercising blood pressure can still start to creep up.  His goal is less than 130/80.  He is willing to try amlodipine again.  We will start 5 mg daily.  He will track his blood pressure twice daily until his follow-up next month.  We discussed adding a diuretic but he already has frequent urination and would like to avoid this if possible.  Of note, he did have an  S3 on exam and EKG shows persistent lateral T wave inversions.  We will get an echocardiogram to assess for LVH or any structural abnormalities.  # Palpitations: Stable.  No symptoms lately.  # Asymtpomatic coronary calcification.   # Hyperlipidemia:  Continue atorvastatin and aspirin.  LDL was 62 on 05/2019.  Continue diet, exercise, and atorvastatin.     Current medicines are reviewed at length with the patient today.  The patient does not have concerns regarding medicines.  The following changes have been made:  none  Labs/ tests ordered today include:   Orders Placed  This Encounter  Procedures  . EKG 12-Lead  . ECHOCARDIOGRAM COMPLETE     Disposition:   FU with Scott Capaldi C. Oval Linsey, MD, [facility] in one month.    Signed, Mario Voong C. Oval Linsey, MD, [facility]  01/28/2020 6:01 PM    [place]

## 2020-01-28 NOTE — Patient Instructions (Signed)
Medication Instructions:  START AMLODIPINE 5 MG DAILY   *If you need a refill on your cardiac medications before your next appointment, please call your pharmacy*  Lab Work: NONE  Testing/Procedures: Your physician has requested that you have an echocardiogram. Echocardiography is a painless test that uses sound waves to create images of your heart. It provides your doctor with information about the size and shape of your heart and how well your heart's chambers and valves are working. This procedure takes approximately one hour. There are no restrictions for this procedure. [facility] AT [address]  Follow-Up: At [facility], you and your health needs are our priority.  As part of our continuing mission to provide you with exceptional heart care, we have created designated Provider Care Teams.  These Care Teams include your primary Cardiologist (physician) and Advanced Practice Providers (APPs -  Physician Assistants and Nurse Practitioners) who all work together to provide you with the care you need, when you need it.  Your next appointment:   4 week(s)  The format for your next appointment:   Virtual Visit   Provider:   DR [facility] [place] PA/NP  Other Instructions MONITOR AND LOG YOUR BLOOD PRESSURE TWICE A DAY. HAVE READINGS AVAILABLE AT YOUR FOLLOW UP VISIT    DASH Eating Plan DASH stands for "Dietary Approaches to Stop Hypertension." The DASH eating plan is a healthy eating plan that has been shown to reduce high blood pressure (hypertension). It may also reduce your risk for type 2 diabetes, heart disease, and stroke. The DASH eating plan may also help with weight loss. What are tips for following this plan?  General guidelines  Avoid eating more than 2,300 mg (milligrams) of salt (sodium) a day. If you have hypertension, you may need to reduce your sodium intake to 1,500 mg a day.  Limit alcohol intake to no more than 1 drink a day for nonpregnant women  and 2 drinks a day for men. One drink equals 12 oz of beer, 5 oz of wine, or 1 oz of hard liquor.  Work with your health care provider to maintain a healthy body weight or to lose weight. Ask what an ideal weight is for you.  Get at least 30 minutes of exercise that causes your heart to beat faster (aerobic exercise) most days of the week. Activities may include walking, swimming, or biking.  Work with your health care provider or diet and nutrition specialist (dietitian) to adjust your eating plan to your individual calorie needs. Reading food labels   Check food labels for the amount of sodium per serving. Choose foods with less than 5 percent of the Daily Value of sodium. Generally, foods with less than 300 mg of sodium per serving fit into this eating plan.  To find whole grains, look for the word "whole" as the first word in the ingredient list. Shopping  Buy products labeled as "low-sodium" or "no salt added."  Buy fresh foods. Avoid canned foods and premade or frozen meals. Cooking  Avoid adding salt when cooking. Use salt-free seasonings or herbs instead of table salt or sea salt. Check with your health care provider or pharmacist before using salt substitutes.  Do not fry foods. Cook foods using healthy methods such as baking, boiling, grilling, and broiling instead.  Cook with heart-healthy oils, such as olive, canola, soybean, or sunflower oil. Meal planning  Eat a balanced diet that includes: ? 5 or more servings of fruits and  vegetables each day. At each meal, try to fill half of your plate with fruits and vegetables. ? Up to 6-8 servings of whole grains each day. ? Less than 6 oz of lean meat, poultry, or fish each day. A 3-oz serving of meat is about the same size as a deck of cards. One egg equals 1 oz. ? 2 servings of low-fat dairy each day. ? A serving of nuts, seeds, or beans 5 times each week. ? Heart-healthy fats. Healthy fats called Omega-3 fatty acids are  found in foods such as flaxseeds and coldwater fish, like sardines, salmon, and mackerel.  Limit how much you eat of the following: ? Canned or prepackaged foods. ? Food that is high in trans fat, such as fried foods. ? Food that is high in saturated fat, such as fatty meat. ? Sweets, desserts, sugary drinks, and other foods with added sugar. ? Full-fat dairy products.  Do not salt foods before eating.  Try to eat at least 2 vegetarian meals each week.  Eat more home-cooked food and less restaurant, buffet, and fast food.  When eating at a restaurant, ask that your food be prepared with less salt or no salt, if possible. What foods are recommended? The items listed may not be a complete list. Talk with your dietitian about what dietary choices are best for you. Grains Whole-grain or whole-wheat bread. Whole-grain or whole-wheat pasta. Brown rice. Orpah Cobb. Bulgur. Whole-grain and low-sodium cereals. Pita bread. Low-fat, low-sodium crackers. Whole-wheat flour tortillas. Vegetables Fresh or frozen vegetables (raw, steamed, roasted, or grilled). Low-sodium or reduced-sodium tomato and vegetable juice. Low-sodium or reduced-sodium tomato sauce and tomato paste. Low-sodium or reduced-sodium canned vegetables. Fruits All fresh, dried, or frozen fruit. Canned fruit in natural juice (without added sugar). Meat and other protein foods Skinless chicken or [place]. Ground chicken or [place]. Pork with fat trimmed off. Fish and seafood. Egg whites. Dried beans, peas, or lentils. Unsalted nuts, nut butters, and seeds. Unsalted canned beans. Lean cuts of beef with fat trimmed off. Low-sodium, lean deli meat. Dairy Low-fat (1%) or fat-free (skim) milk. Fat-free, low-fat, or reduced-fat cheeses. Nonfat, low-sodium ricotta or cottage cheese. Low-fat or nonfat yogurt. Low-fat, low-sodium cheese. Fats and oils Soft margarine without trans fats. Vegetable oil. Low-fat, reduced-fat, or light mayonnaise  and salad dressings (reduced-sodium). Canola, safflower, olive, soybean, and sunflower oils. Avocado. Seasoning and other foods Herbs. Spices. Seasoning mixes without salt. Unsalted popcorn and pretzels. Fat-free sweets. What foods are not recommended? The items listed may not be a complete list. Talk with your dietitian about what dietary choices are best for you. Grains Baked goods made with fat, such as croissants, muffins, or some breads. Dry pasta or rice meal packs. Vegetables Creamed or fried vegetables. Vegetables in a cheese sauce. Regular canned vegetables (not low-sodium or reduced-sodium). Regular canned tomato sauce and paste (not low-sodium or reduced-sodium). Regular tomato and vegetable juice (not low-sodium or reduced-sodium). Rosita Fire. Olives. Fruits Canned fruit in a light or heavy syrup. Fried fruit. Fruit in cream or butter sauce. Meat and other protein foods Fatty cuts of meat. Ribs. Fried meat. Tomasa Blase. Sausage. Bologna and other processed lunch meats. Salami. Fatback. Hotdogs. Bratwurst. Salted nuts and seeds. Canned beans with added salt. Canned or smoked fish. Whole eggs or egg yolks. Chicken or [place] with skin. Dairy Whole or 2% milk, cream, and half-and-half. Whole or full-fat cream cheese. Whole-fat or sweetened yogurt. Full-fat cheese. Nondairy creamers. Whipped toppings. Processed cheese and cheese spreads. Fats and oils  Butter. Stick margarine. Lard. Shortening. Ghee. Bacon fat. Tropical oils, such as coconut, palm kernel, or palm oil. Seasoning and other foods Salted popcorn and pretzels. Onion salt, garlic salt, seasoned salt, table salt, and sea salt. Worcestershire sauce. Tartar sauce. Barbecue sauce. Teriyaki sauce. Soy sauce, including reduced-sodium. Steak sauce. Canned and packaged gravies. Fish sauce. Oyster sauce. Cocktail sauce. Horseradish that you find on the shelf. Ketchup. Mustard. Meat flavorings and tenderizers. Bouillon cubes. Hot sauce and Tabasco  sauce. Premade or packaged marinades. Premade or packaged taco seasonings. Relishes. Regular salad dressings. Where to find more information:  National Heart, Lung, and [place]: https://wilson-eaton.com/  American Heart Association: www.heart.org Summary  The DASH eating plan is a healthy eating plan that has been shown to reduce high blood pressure (hypertension). It may also reduce your risk for type 2 diabetes, heart disease, and stroke.  With the DASH eating plan, you should limit salt (sodium) intake to 2,300 mg a day. If you have hypertension, you may need to reduce your sodium intake to 1,500 mg a day.  When on the DASH eating plan, aim to eat more fresh fruits and vegetables, whole grains, lean proteins, low-fat dairy, and heart-healthy fats.  Work with your health care provider or diet and nutrition specialist (dietitian) to adjust your eating plan to your individual calorie needs. This information is not intended to replace advice given to you by your health care provider. Make sure you discuss any questions you have with your health care provider. Document Revised: 11/23/2017 Document Reviewed: 12/04/2016 Elsevier Patient Education  2020 [employer].

## 2020-01-31 DIAGNOSIS — Z20828 Contact with and (suspected) exposure to other viral communicable diseases: Secondary | ICD-10-CM | POA: Diagnosis not present

## 2020-01-31 DIAGNOSIS — Z03818 Encounter for observation for suspected exposure to other biological agents ruled out: Secondary | ICD-10-CM | POA: Diagnosis not present

## 2020-01-31 DIAGNOSIS — I10 Essential (primary) hypertension: Secondary | ICD-10-CM | POA: Diagnosis not present

## 2020-02-11 ENCOUNTER — Other Ambulatory Visit: Payer: Self-pay

## 2020-02-11 ENCOUNTER — Ambulatory Visit (HOSPITAL_COMMUNITY): Payer: BC Managed Care – PPO | Attending: Cardiology

## 2020-02-11 DIAGNOSIS — I10 Essential (primary) hypertension: Secondary | ICD-10-CM | POA: Insufficient documentation

## 2020-02-16 ENCOUNTER — Telehealth: Payer: Self-pay | Admitting: *Deleted

## 2020-02-16 DIAGNOSIS — I517 Cardiomegaly: Secondary | ICD-10-CM

## 2020-02-16 DIAGNOSIS — I10 Essential (primary) hypertension: Secondary | ICD-10-CM

## 2020-02-16 NOTE — Telephone Encounter (Signed)
Advised patient, verbalized understanding Scheduled echo and place order in Epic

## 2020-02-16 NOTE — Telephone Encounter (Signed)
-----   Message from Chilton Si, MD sent at 02/12/2020  5:52 PM EST ----- Echo shows that his heart is squeezing well.  His heart muscle is thickened.  This is a sign that we need to do a better job controlling his BP.  Strict BP control with goal <130/80.  Repeat echo in 6 months.

## 2020-02-23 DIAGNOSIS — Z03818 Encounter for observation for suspected exposure to other biological agents ruled out: Secondary | ICD-10-CM | POA: Diagnosis not present

## 2020-02-24 ENCOUNTER — Encounter: Payer: Self-pay | Admitting: Cardiovascular Disease

## 2020-02-24 ENCOUNTER — Telehealth (INDEPENDENT_AMBULATORY_CARE_PROVIDER_SITE_OTHER): Payer: BC Managed Care – PPO | Admitting: Cardiovascular Disease

## 2020-02-24 DIAGNOSIS — I517 Cardiomegaly: Secondary | ICD-10-CM

## 2020-02-24 DIAGNOSIS — I10 Essential (primary) hypertension: Secondary | ICD-10-CM | POA: Diagnosis not present

## 2020-02-24 DIAGNOSIS — E785 Hyperlipidemia, unspecified: Secondary | ICD-10-CM

## 2020-02-24 DIAGNOSIS — I2510 Atherosclerotic heart disease of native coronary artery without angina pectoris: Secondary | ICD-10-CM

## 2020-02-24 HISTORY — DX: Cardiomegaly: I51.7

## 2020-02-24 HISTORY — DX: Essential (primary) hypertension: I10

## 2020-02-24 MED ORDER — AMLODIPINE BESYLATE 10 MG PO TABS: 10.0000 mg | ORAL_TABLET | Freq: Every day | ORAL | 1 refills | Status: DC

## 2020-02-24 NOTE — Progress Notes (Signed)
Virtual Visit via Video Note   This visit type was conducted due to national recommendations for restrictions regarding the COVID-19 Pandemic (e.g. social distancing) in an effort to limit this patient's exposure and mitigate transmission in our community.  Due to his co-morbid illnesses, this patient is at least at moderate risk for complications without adequate follow up.  This format is felt to be most appropriate for this patient at this time.  All issues noted in this document were discussed and addressed.  A limited physical exam was performed with this format.  Please refer to the patient's chart for his consent to telehealth for [facility].   Date:  02/24/2020   ID:  Scott Brandt, DOB Oct 08, 1961, MRN [id number]  Patient Location: Home Provider Location: Office  PCP:  Karle Plumber, MD  Cardiologist:  Chilton Si, MD  Electrophysiologist:  None   Evaluation Performed:  Follow-Up Visit  Chief Complaint:  hypertension  History of Present Illness:    Scott Brandt is a 59 y.o. male with hyperlipidemia, asthma, anxiety and TIA (2007) who presents for follow up.  He was initially seen 09/2017 to establish care.  He reported palpitations only occur in the setting of stressful situations.  The palpitations subside after the stressful stimulus is over.  He exercises 4 times per week and has no exertional symptoms.  No further testing was performed.  He was very hypertensive in clinic.  It was unclear if this was due to anxiety.  He was started on amlodipine 2.5 mg daily and asked to check his blood pressure at home.  He also had a coronary calcium score that showed one area of calcium in the left anterior descending.  His calcium score was 4, which is 44th percentile for age and gender.  It was recommended that he start taking his atorvastatin daily.  Mr. Ricketson stopped his amlodipine and was trying to control his BP with diet and exercise.  However his blood pressure has been quite  elevated.  At his last appointment amlodipine was added at 5 mg daily.  He also had an echocardiogram 02/11/2020 that revealed LVEF 55 to 60% with grade 1 diastolic dysfunction.  He had moderate LVH.  The echo was otherwise unremarkable.  Since that time his blood pressure has been in the 130s to 140s over 80s to 90s.  He did see his doctor yesterday and his blood pressure was 126/65.  Overall he has felt well physically.  He is not getting as much exercise lately because his mother and sister recently moved in with him.  They were both recently infected with Covid.  He has managed to stay negative and is feeling okay.  He denies any chest pain, shortness of breath, lower extremity edema, orthopnea, or PND.  The patient does not have symptoms concerning for COVID-19 infection (fever, chills, cough, or new shortness of breath).    Past Medical History:  Diagnosis Date  . Asthma    signs of asthma from Dr Sherene Sires   . Asthma   . Essential hypertension 02/24/2020  . Hypercholesteremia   . Left ventricular hypertrophy 02/24/2020  . TIA (transient ischemic attack) 2007  . Urticaria    Past Surgical History:  Procedure Laterality Date  . FEMUR IM NAIL Left 02/28/2016   Procedure: INTRAMEDULLARY (IM) NAIL FEMORAL;  Surgeon: Durene Romans, MD;  Location: [facility] OR;  Service: Orthopedics;  Laterality: Left;     Current Meds  Medication Sig  . ALPRAZolam Prudy Feeler)  0.5 MG tablet Take 0.5 mg by mouth at bedtime as needed for anxiety.  Marland Kitchen amLODipine (NORVASC) 5 MG tablet Take 1 tablet (5 mg total) by mouth daily.  Marland Kitchen aspirin 81 MG tablet Take 81 mg by mouth every other day.   Marland Kitchen atorvastatin (LIPITOR) 20 MG tablet Take 1 tablet (20 mg total) every other day by mouth.  . brimonidine (ALPHAGAN) 0.2 % ophthalmic solution brimonidine 0.2 % eye drops  INSTILL 1 DROP INTO BOTH EYES TWICE A DAY  . Cholecalciferol (D3 ADULT PO) Take by mouth.  . fexofenadine (ALLEGRA) 180 MG tablet Take 180 mg by mouth as needed.   .  fluticasone (FLONASE) 50 MCG/ACT nasal spray fluticasone propionate 50 mcg/actuation nasal spray,suspension  . latanoprost (XALATAN) 0.005 % ophthalmic solution latanoprost 0.005 % eye drops  INSTILL 1 DROP INTO BOTH EYES AT BEDTIME     Allergies:   Shellfish allergy   Social History   Tobacco Use  . Smoking status: Never Smoker  . Smokeless tobacco: Never Used  Substance Use Topics  . Alcohol use: Yes    Alcohol/week: 0.0 standard drinks    Comment: social  . Drug use: No     Family Hx: The patient's family history includes Allergic rhinitis in his father; Cancer in his sister; Diabetes in his sister; Hyperlipidemia in his brother and mother; Hypertension in his brother, mother, and sister; Rheum arthritis in his sister; Stroke in his maternal grandmother. There is no history of Angioedema, Asthma, Atopy, Eczema, Immunodeficiency, or Urticaria.  ROS:   Please see the history of present illness.     All other systems reviewed and are negative.   Prior CV studies:   The following studies were reviewed today:  Echo 02/11/20: 1. Left ventricular ejection fraction, by estimation, is 55 to 60%. The  left ventricle has normal function. The left ventricle has no regional  wall motion abnormalities. There is moderate concentric left ventricular  hypertrophy. Left ventricular  diastolic parameters are consistent with Grade I diastolic dysfunction  (impaired relaxation). Elevated left atrial pressure.  2. Right ventricular systolic function is normal. The right ventricular  size is normal. There is normal pulmonary artery systolic pressure. The  estimated right ventricular systolic pressure is 42.3 mmHg.  3. Left atrial size was moderately dilated.  4. The mitral valve is normal in structure and function. Trivial mitral  valve regurgitation. No evidence of mitral stenosis.  5. The aortic valve is normal in structure and function. Aortic valve  regurgitation is not visualized.  Mild aortic valve sclerosis is present,  with no evidence of aortic valve stenosis.  6. The inferior vena cava is normal in size with greater than 50%  respiratory variability, suggesting right atrial pressure of 3 mmHg.   Labs/Other Tests and Data Reviewed:    EKG:  No ECG reviewed.     Recent Labs: 06/20/2019: ALT 24; BUN 13; Creatinine, Ser 1.13; Potassium 4.5; Sodium 139   Recent Lipid Panel Lab Results  Component Value Date/Time   CHOL 146 06/20/2019 09:25 AM   TRIG 66 06/20/2019 09:25 AM   HDL 71 06/20/2019 09:25 AM   CHOLHDL 2.1 06/20/2019 09:25 AM   LDLCALC 62 06/20/2019 09:25 AM    Wt Readings from Last 3 Encounters:  02/24/20 224 lb (101.6 kg)  01/28/20 227 lb 4.8 oz (103.1 kg)  02/28/16 217 lb (98.4 kg)     Objective:    Vital Signs:  BP 134/88   Pulse (!) 57  Ht 6' (1.829 m)   Wt 224 lb (101.6 kg)   BMI 30.38 kg/m    VITAL SIGNS:  reviewed GEN:  no acute distress EYES:  sclerae anicteric, EOMI - Extraocular Movements Intact RESPIRATORY:  normal respiratory effort, symmetric expansion CARDIOVASCULAR:  no peripheral edema SKIN:  no rash, lesions or ulcers. MUSCULOSKELETAL:  no obvious deformities. NEURO:  alert and oriented x 3, no obvious focal deficit PSYCH:  normal affect  ASSESSMENT & PLAN:    # Hypertension: Blood pressure is improving on amlodipine but still above goal.  We will increase to 10 mg daily.  He will continue to track his blood pressure at home.  His goal is less than 130/80. Of note, he did have an S3 on exam and EKG shows persistent lateral T wave inversions.    Echo showed moderate LVH.  His hypertrophy seems somewhat out of proportion to what I would expect for hypertension managed on one medication.  We will plan to repeat his echo in a year.  If he still has significant hypertrophy we will evaluate for HCM.  # Palpitations: Stable.  No symptoms lately.  # Asymtpomatic coronary calcification.   # Hyperlipidemia:  Continue  atorvastatin and aspirin.  LDL was 62 on 05/2019.  Continue diet, exercise, and atorvastatin.  COVID-19 Education: The signs and symptoms of COVID-19 were discussed with the patient and how to seek care for testing (follow up with PCP or arrange E-visit).  The importance of social distancing was discussed today.  Time:   Today, I have spent 15 minutes with the patient with telehealth technology discussing the above problems.     Medication Adjustments/Labs and Tests Ordered: Current medicines are reviewed at length with the patient today.  Concerns regarding medicines are outlined above.   Tests Ordered: No orders of the defined types were placed in this encounter.   Medication Changes: No orders of the defined types were placed in this encounter.   Follow Up:  Either In Person or Virtual in 6 week(s)  Signed, Chilton Si, MD  02/24/2020 11:10 AM    [facility]

## 2020-02-24 NOTE — Patient Instructions (Addendum)
Medication Instructions:  INCREASE YOUR AMLODIPINE TO 10 MG DAILY   *If you need a refill on your cardiac medications before your next appointment, please call your pharmacy*  Lab Work: NONE   Testing/Procedures: NONE   Follow-Up: At [facility], you and your health needs are our priority.  As part of our continuing mission to provide you with exceptional heart care, we have created designated Provider Care Teams.  These Care Teams include your primary Cardiologist (physician) and Advanced Practice Providers (APPs -  Physician Assistants and Nurse Practitioners) who all work together to provide you with the care you need, when you need it.  We recommend signing up for the patient portal called "MyChart".  Sign up information is provided on this After Visit Summary.  MyChart is used to connect with patients for Virtual Visits (Telemedicine).  Patients are able to view lab/test results, encounter notes, upcoming appointments, etc.  Non-urgent messages can be sent to your provider as well.   To learn more about what you can do with MyChart, go to [email or web address].    [facility] VISIT 04/05/2020 AT 9:20 AM

## 2020-02-24 NOTE — Addendum Note (Signed)
Addended by: Regis Bill B on: 02/24/2020 12:09 PM   Modules accepted: Orders

## 2020-03-01 DIAGNOSIS — M9903 Segmental and somatic dysfunction of lumbar region: Secondary | ICD-10-CM | POA: Diagnosis not present

## 2020-03-01 DIAGNOSIS — M9901 Segmental and somatic dysfunction of cervical region: Secondary | ICD-10-CM | POA: Diagnosis not present

## 2020-03-01 DIAGNOSIS — M5442 Lumbago with sciatica, left side: Secondary | ICD-10-CM | POA: Diagnosis not present

## 2020-03-01 DIAGNOSIS — M47812 Spondylosis without myelopathy or radiculopathy, cervical region: Secondary | ICD-10-CM | POA: Diagnosis not present

## 2020-03-03 DIAGNOSIS — M25552 Pain in left hip: Secondary | ICD-10-CM | POA: Diagnosis not present

## 2020-03-03 DIAGNOSIS — M47812 Spondylosis without myelopathy or radiculopathy, cervical region: Secondary | ICD-10-CM | POA: Diagnosis not present

## 2020-03-03 DIAGNOSIS — M9903 Segmental and somatic dysfunction of lumbar region: Secondary | ICD-10-CM | POA: Diagnosis not present

## 2020-03-03 DIAGNOSIS — M9901 Segmental and somatic dysfunction of cervical region: Secondary | ICD-10-CM | POA: Diagnosis not present

## 2020-03-03 DIAGNOSIS — M5442 Lumbago with sciatica, left side: Secondary | ICD-10-CM | POA: Diagnosis not present

## 2020-03-03 DIAGNOSIS — M25562 Pain in left knee: Secondary | ICD-10-CM | POA: Diagnosis not present

## 2020-03-10 DIAGNOSIS — M5442 Lumbago with sciatica, left side: Secondary | ICD-10-CM | POA: Diagnosis not present

## 2020-03-10 DIAGNOSIS — M9903 Segmental and somatic dysfunction of lumbar region: Secondary | ICD-10-CM | POA: Diagnosis not present

## 2020-03-10 DIAGNOSIS — M47812 Spondylosis without myelopathy or radiculopathy, cervical region: Secondary | ICD-10-CM | POA: Diagnosis not present

## 2020-03-10 DIAGNOSIS — M9901 Segmental and somatic dysfunction of cervical region: Secondary | ICD-10-CM | POA: Diagnosis not present

## 2020-03-15 DIAGNOSIS — Z20828 Contact with and (suspected) exposure to other viral communicable diseases: Secondary | ICD-10-CM | POA: Diagnosis not present

## 2020-04-05 ENCOUNTER — Telehealth: Payer: BC Managed Care – PPO | Admitting: Cardiovascular Disease

## 2020-04-05 DIAGNOSIS — M9901 Segmental and somatic dysfunction of cervical region: Secondary | ICD-10-CM | POA: Diagnosis not present

## 2020-04-05 DIAGNOSIS — M5442 Lumbago with sciatica, left side: Secondary | ICD-10-CM | POA: Diagnosis not present

## 2020-04-05 DIAGNOSIS — M9903 Segmental and somatic dysfunction of lumbar region: Secondary | ICD-10-CM | POA: Diagnosis not present

## 2020-04-05 DIAGNOSIS — M47812 Spondylosis without myelopathy or radiculopathy, cervical region: Secondary | ICD-10-CM | POA: Diagnosis not present

## 2020-04-06 ENCOUNTER — Ambulatory Visit (INDEPENDENT_AMBULATORY_CARE_PROVIDER_SITE_OTHER): Payer: BC Managed Care – PPO | Admitting: Cardiovascular Disease

## 2020-04-06 ENCOUNTER — Encounter: Payer: Self-pay | Admitting: Cardiovascular Disease

## 2020-04-06 ENCOUNTER — Other Ambulatory Visit: Payer: Self-pay

## 2020-04-06 VITALS — BP 134/88 | HR 62 | Ht 72.0 in | Wt 229.0 lb

## 2020-04-06 DIAGNOSIS — I517 Cardiomegaly: Secondary | ICD-10-CM

## 2020-04-06 DIAGNOSIS — I10 Essential (primary) hypertension: Secondary | ICD-10-CM

## 2020-04-06 NOTE — Patient Instructions (Addendum)
Medication Instructions:  START TAKING YOUR AMLODIPINE AT THE SAME TIME EACH DAY   *If you need a refill on your cardiac medications before your next appointment, please call your pharmacy*  Lab Work: NONE If you have labs (blood work) drawn today and your tests are completely normal, you will receive your results only by: Marland Kitchen MyChart Message (if you have MyChart) OR . A paper copy in the mail If you have any lab test that is abnormal or we need to change your treatment, we will call you to review the results.  Testing/Procedures: NONE  Follow-Up: At [facility], you and your health needs are our priority.  As part of our continuing mission to provide you with exceptional heart care, we have created designated Provider Care Teams.  These Care Teams include your primary Cardiologist (physician) and Advanced Practice Providers (APPs -  Physician Assistants and Nurse Practitioners) who all work together to provide you with the care you need, when you need it.  We recommend signing up for the patient portal called "MyChart".  Sign up information is provided on this After Visit Summary.  MyChart is used to connect with patients for Virtual Visits (Telemedicine).  Patients are able to view lab/test results, encounter notes, upcoming appointments, etc.  Non-urgent messages can be sent to your provider as well.   To learn more about what you can do with MyChart, go to [email or web address].    Your next appointment:   3 month(s)  You will receive a reminder letter in the mail two months in advance. If you don't receive a letter, please call our office to schedule the follow-up appointment.  The format for your next appointment:   In Person  Provider:   You may see Chilton Si, MD or one of the following Advanced Practice Providers on your designated Care Team:    Corine Shelter, PA-C  [place], [place]  Edd Fabian, FNP    Other Instructions BE MORE CONSISTENT IN YOUR  EXERCISING   SEND BLOOD PRESSURE READING IN [facility] NEXT WEEK

## 2020-04-06 NOTE — Progress Notes (Signed)
Cardiology Office Note    Date:  04/06/2020   ID:  Scott Brandt, DOB 06-08-1961, MRN [id number]   PCP:  Karle Plumber, MD  Cardiologist:  Chilton Si, MD  Electrophysiologist:  None   Evaluation Performed:  Follow-Up Visit  Chief Complaint:  hypertension  History of Present Illness:    Scott Brandt is a 59 y.o. male with hyperlipidemia, asthma, anxiety and TIA (2007) who presents for follow up.  He was initially seen 09/2017 to establish care.  He reported palpitations only occur in the setting of stressful situations.  The palpitations subside after the stressful stimulus is over.  He exercises 4 times per week and has no exertional symptoms.  No further testing was performed.  He was very hypertensive in clinic.  It was unclear if this was due to anxiety.  He was started on amlodipine 2.5 mg daily and asked to check his blood pressure at home.  He also had a coronary calcium score that showed one area of calcium in the left anterior descending.  His calcium score was 4, which is 44th percentile for age and gender.  It was recommended that he start taking his atorvastatin daily.  Scott Brandt stopped his amlodipine and was trying to control his BP with diet and exercise.  However his blood pressure has been quite elevated.  At his last appointment amlodipine was added at 5 mg daily.  His blood pressure continues to be elevated so amlodipine was increased to 10 mg at his last appointment.  He had an echocardiogram 02/11/2020 that revealed LVEF 55 to 60% with grade 1 diastolic dysfunction.  He had moderate LVH.  The echo was otherwise unremarkable.  He notes that since starting the amlodipine he is feeling more calm and relaxed.  He continues to exercise regularly and feels okay with exercise.  His blood pressure seems to be somewhat variable ranging between the 120s to the 150s over 80s to 90s.  He does note that he is somewhat inconsistent about what time he takes his medication.  Typically  it is somewhere between 9 and 12, but sometimes he forgets to take until 2 PM.  He has been stressed somewhat at work lately.  His diet is improving and he tries to limit his sodium intake.   Past Medical History:  Diagnosis Date  . Asthma    signs of asthma from Dr Sherene Sires   . Asthma   . Essential hypertension 02/24/2020  . Hypercholesteremia   . Left ventricular hypertrophy 02/24/2020  . TIA (transient ischemic attack) 2007  . Urticaria    Past Surgical History:  Procedure Laterality Date  . FEMUR IM NAIL Left 02/28/2016   Procedure: INTRAMEDULLARY (IM) NAIL FEMORAL;  Surgeon: Durene Romans, MD;  Location: [facility] OR;  Service: Orthopedics;  Laterality: Left;     Current Meds  Medication Sig  . ALPRAZolam (XANAX) 0.5 MG tablet Take 0.5 mg by mouth at bedtime as needed for anxiety.  Marland Kitchen amLODipine (NORVASC) 10 MG tablet Take 1 tablet (10 mg total) by mouth daily.  Marland Kitchen aspirin 81 MG tablet Take 81 mg by mouth every other day.   Marland Kitchen atorvastatin (LIPITOR) 20 MG tablet Take 1 tablet (20 mg total) every other day by mouth.  . brimonidine (ALPHAGAN) 0.2 % ophthalmic solution brimonidine 0.2 % eye drops  INSTILL 1 DROP INTO BOTH EYES TWICE A DAY  . Cholecalciferol (D3 ADULT PO) Take by mouth.  . fexofenadine (ALLEGRA) 180 MG tablet Take  180 mg by mouth as needed.   . fluocinonide cream (LIDEX) 0.05 % APPLY 1 A SMALL AMOUNT APPLY ON THE SKIN TWICE A DAY AS NEEDED  . fluticasone (FLONASE) 50 MCG/ACT nasal spray fluticasone propionate 50 mcg/actuation nasal spray,suspension  . latanoprost (XALATAN) 0.005 % ophthalmic solution latanoprost 0.005 % eye drops  INSTILL 1 DROP INTO BOTH EYES AT BEDTIME     Allergies:   Shellfish allergy   Social History   Tobacco Use  . Smoking status: Never Smoker  . Smokeless tobacco: Never Used  Substance Use Topics  . Alcohol use: Yes    Alcohol/week: 0.0 standard drinks    Comment: social  . Drug use: No     Family Hx: The patient's family history includes  Allergic rhinitis in his father; Cancer in his sister; Diabetes in his sister; Hyperlipidemia in his brother and mother; Hypertension in his brother, mother, and sister; Rheum arthritis in his sister; Stroke in his maternal grandmother. There is no history of Angioedema, Asthma, Atopy, Eczema, Immunodeficiency, or Urticaria.  ROS:   Please see the history of present illness.     All other systems reviewed and are negative.   Prior CV studies:   The following studies were reviewed today:  Echo 02/11/20: 1. Left ventricular ejection fraction, by estimation, is 55 to 60%. The  left ventricle has normal function. The left ventricle has no regional  wall motion abnormalities. There is moderate concentric left ventricular  hypertrophy. Left ventricular  diastolic parameters are consistent with Grade I diastolic dysfunction  (impaired relaxation). Elevated left atrial pressure.  2. Right ventricular systolic function is normal. The right ventricular  size is normal. There is normal pulmonary artery systolic pressure. The  estimated right ventricular systolic pressure is 13.0 mmHg.  3. Left atrial size was moderately dilated.  4. The mitral valve is normal in structure and function. Trivial mitral  valve regurgitation. No evidence of mitral stenosis.  5. The aortic valve is normal in structure and function. Aortic valve  regurgitation is not visualized. Mild aortic valve sclerosis is present,  with no evidence of aortic valve stenosis.  6. The inferior vena cava is normal in size with greater than 50%  respiratory variability, suggesting right atrial pressure of 3 mmHg.   Labs/Other Tests and Data Reviewed:    EKG:  No ECG reviewed.   Recent Labs: 06/20/2019: ALT 24; BUN 13; Creatinine, Ser 1.13; Potassium 4.5; Sodium 139   Recent Lipid Panel Lab Results  Component Value Date/Time   CHOL 146 06/20/2019 09:25 AM   TRIG 66 06/20/2019 09:25 AM   HDL 71 06/20/2019 09:25 AM    CHOLHDL 2.1 06/20/2019 09:25 AM   LDLCALC 62 06/20/2019 09:25 AM    Wt Readings from Last 3 Encounters:  04/06/20 229 lb (103.9 kg)  02/24/20 224 lb (101.6 kg)  01/28/20 227 lb 4.8 oz (103.1 kg)     Objective:    Vital Signs:  BP 134/88   Pulse 62   Ht 6' (1.829 m)   Wt 229 lb (103.9 kg)   SpO2 98%   BMI 31.06 kg/m    VITAL SIGNS:  reviewed GEN:  no acute distress EYES:  sclerae anicteric, EOMI - Extraocular Movements Intact RESPIRATORY:  normal respiratory effort, symmetric expansion CARDIOVASCULAR:  no peripheral edema SKIN:  no rash, lesions or ulcers. MUSCULOSKELETAL:  no obvious deformities. NEURO:  alert and oriented x 3, no obvious focal deficit PSYCH:  normal affect  ASSESSMENT & PLAN:    #  Hypertension: Blood pressure is improving.  It is above goal but he is not taking the medication consistently.  He will start taking it at the same time every day and continue tracking his blood pressure.  If it remains elevated he understands that we will need to add hydrochlorothiazide.  He will give [place] a call in about a month or send a my chart message to let us know the average blood pressure.  Of note, he did have an S3 on exam and EKG shows persistent lateral T wave inversions.   Echo showed moderate LVH.  His hypertrophy seems somewhat out of proportion to what I would expect for hypertension managed on one medication.  We will plan to repeat his echo in a year.  If he still has significant hypertrophy we will evaluate for HCM.  # Palpitations: Stable.  No symptoms lately.  # Asymtpomatic coronary calcification.   # Hyperlipidemia:  Continue atorvastatin and aspirin.  LDL was 62 on 05/2019.  Continue diet, exercise, and atorvastatin.   Medication Adjustments/Labs and Tests Ordered: Current medicines are reviewed at length with the patient today.  Concerns regarding medicines are outlined above.   Tests Ordered: No orders of the defined types were placed in this  encounter.   Medication Changes: No orders of the defined types were placed in this encounter.   Follow Up:  Either In Person or Virtual in 3 month(s)  Signed, Chilton Si, MD  04/06/2020 5:06 PM    [facility]

## 2020-04-20 DIAGNOSIS — R509 Fever, unspecified: Secondary | ICD-10-CM | POA: Diagnosis not present

## 2020-04-21 DIAGNOSIS — M7741 Metatarsalgia, right foot: Secondary | ICD-10-CM | POA: Diagnosis not present

## 2020-04-21 DIAGNOSIS — M79671 Pain in right foot: Secondary | ICD-10-CM | POA: Diagnosis not present

## 2020-05-11 ENCOUNTER — Other Ambulatory Visit: Payer: Self-pay

## 2020-05-11 MED ORDER — AMLODIPINE BESYLATE 10 MG PO TABS: 10.0000 mg | ORAL_TABLET | Freq: Every day | ORAL | 1 refills | Status: DC

## 2020-05-18 ENCOUNTER — Other Ambulatory Visit: Payer: Self-pay

## 2020-05-21 ENCOUNTER — Other Ambulatory Visit: Payer: Self-pay

## 2020-05-24 DIAGNOSIS — Z0000 Encounter for general adult medical examination without abnormal findings: Secondary | ICD-10-CM | POA: Diagnosis not present

## 2020-05-24 DIAGNOSIS — Z79899 Other long term (current) drug therapy: Secondary | ICD-10-CM | POA: Diagnosis not present

## 2020-05-24 DIAGNOSIS — E7800 Pure hypercholesterolemia, unspecified: Secondary | ICD-10-CM | POA: Diagnosis not present

## 2020-05-24 DIAGNOSIS — E559 Vitamin D deficiency, unspecified: Secondary | ICD-10-CM | POA: Diagnosis not present

## 2020-05-24 DIAGNOSIS — I10 Essential (primary) hypertension: Secondary | ICD-10-CM | POA: Diagnosis not present

## 2020-05-31 ENCOUNTER — Other Ambulatory Visit: Payer: Self-pay | Admitting: Cardiovascular Disease

## 2020-05-31 NOTE — Telephone Encounter (Signed)
New Message     *STAT* If patient is at the pharmacy, call can be transferred to refill team.   1. Which medications need to be refilled? (please list name of each medication and dose if known) amLODipine (NORVASC) 10 MG tablet  2. Which pharmacy/location (including street and city if local pharmacy) is medication to be sent to? [facility] for this medication only    3. Do they need a 30 day or 90 day supply? 90    Pt is almost out

## 2020-06-01 MED ORDER — AMLODIPINE BESYLATE 10 MG PO TABS: 10.0000 mg | ORAL_TABLET | Freq: Every day | ORAL | 3 refills | Status: DC

## 2020-06-09 DIAGNOSIS — H04123 Dry eye syndrome of bilateral lacrimal glands: Secondary | ICD-10-CM | POA: Diagnosis not present

## 2020-06-09 DIAGNOSIS — H2513 Age-related nuclear cataract, bilateral: Secondary | ICD-10-CM | POA: Diagnosis not present

## 2020-06-09 DIAGNOSIS — H401132 Primary open-angle glaucoma, bilateral, moderate stage: Secondary | ICD-10-CM | POA: Diagnosis not present

## 2020-07-09 ENCOUNTER — Telehealth: Payer: Self-pay | Admitting: Cardiovascular Disease

## 2020-07-09 NOTE — Telephone Encounter (Signed)
New message:     Pt wants to know if he needs an Echo in August, since he had one in February?

## 2020-07-09 NOTE — Telephone Encounter (Signed)
Called and spoke with pt, notified that there was not a note in his chart where someone had tried to call him, notified it may have been a reminder call for his echo in august. Notified Dr.[place] wanted to repeat his echo 6 months from February . Pt verbalized understanding and had no other questions at this time.

## 2020-08-16 ENCOUNTER — Ambulatory Visit (HOSPITAL_COMMUNITY): Payer: BC Managed Care – PPO

## 2020-08-16 DIAGNOSIS — N401 Enlarged prostate with lower urinary tract symptoms: Secondary | ICD-10-CM | POA: Diagnosis not present

## 2020-08-16 DIAGNOSIS — N4889 Other specified disorders of penis: Secondary | ICD-10-CM | POA: Diagnosis not present

## 2020-08-16 DIAGNOSIS — N3943 Post-void dribbling: Secondary | ICD-10-CM | POA: Diagnosis not present

## 2020-08-17 ENCOUNTER — Other Ambulatory Visit: Payer: Self-pay

## 2020-08-17 ENCOUNTER — Ambulatory Visit (HOSPITAL_COMMUNITY): Payer: BC Managed Care – PPO | Attending: Cardiology

## 2020-08-17 DIAGNOSIS — I10 Essential (primary) hypertension: Secondary | ICD-10-CM | POA: Diagnosis not present

## 2020-08-17 DIAGNOSIS — I517 Cardiomegaly: Secondary | ICD-10-CM

## 2020-08-17 LAB — ECHOCARDIOGRAM COMPLETE
Area-P 1/2: 2.57 cm2
S' Lateral: 2.3 cm

## 2020-08-20 ENCOUNTER — Ambulatory Visit (INDEPENDENT_AMBULATORY_CARE_PROVIDER_SITE_OTHER): Payer: BC Managed Care – PPO | Admitting: Cardiovascular Disease

## 2020-08-20 ENCOUNTER — Encounter: Payer: Self-pay | Admitting: Cardiovascular Disease

## 2020-08-20 ENCOUNTER — Other Ambulatory Visit: Payer: Self-pay

## 2020-08-20 VITALS — BP 134/88 | HR 55 | Ht 72.0 in | Wt 228.4 lb

## 2020-08-20 DIAGNOSIS — Z5181 Encounter for therapeutic drug level monitoring: Secondary | ICD-10-CM | POA: Diagnosis not present

## 2020-08-20 DIAGNOSIS — Z125 Encounter for screening for malignant neoplasm of prostate: Secondary | ICD-10-CM

## 2020-08-20 DIAGNOSIS — I517 Cardiomegaly: Secondary | ICD-10-CM

## 2020-08-20 DIAGNOSIS — E7800 Pure hypercholesterolemia, unspecified: Secondary | ICD-10-CM

## 2020-08-20 DIAGNOSIS — R002 Palpitations: Secondary | ICD-10-CM | POA: Diagnosis not present

## 2020-08-20 DIAGNOSIS — I10 Essential (primary) hypertension: Secondary | ICD-10-CM

## 2020-08-20 LAB — LIPID PANEL
Chol/HDL Ratio: 2.2 ratio (ref 0.0–5.0)
Cholesterol, Total: 169 mg/dL (ref 100–199)
HDL: 76 mg/dL (ref >39)
LDL Chol Calc (NIH): 79 mg/dL (ref 0–99)
Triglycerides: 71 mg/dL (ref 0–149)
VLDL Cholesterol Cal: 14 mg/dL (ref 5–40)

## 2020-08-20 LAB — CBC WITH DIFFERENTIAL/PLATELET
Basophils Absolute: 0.1 10*3/uL (ref 0.0–0.2)
Basos: 1 %
EOS (ABSOLUTE): 0.1 10*3/uL (ref 0.0–0.4)
Eos: 2 %
Hematocrit: 43.3 % (ref 37.5–51.0)
Hemoglobin: 15.1 g/dL (ref 13.0–17.7)
Immature Grans (Abs): 0 10*3/uL (ref 0.0–0.1)
Immature Granulocytes: 0 %
Lymphocytes Absolute: 2.3 10*3/uL (ref 0.7–3.1)
Lymphs: 43 %
MCH: 31.1 pg (ref 26.6–33.0)
MCHC: 34.9 g/dL (ref 31.5–35.7)
MCV: 89 fL (ref 79–97)
Monocytes Absolute: 0.4 10*3/uL (ref 0.1–0.9)
Monocytes: 7 %
Neutrophils Absolute: 2.5 10*3/uL (ref 1.4–7.0)
Neutrophils: 47 %
Platelets: 272 10*3/uL (ref 150–450)
RBC: 4.85 x10E6/uL (ref 4.14–5.80)
RDW: 13.2 % (ref 11.6–15.4)
WBC: 5.3 10*3/uL (ref 3.4–10.8)

## 2020-08-20 LAB — COMPREHENSIVE METABOLIC PANEL
ALT: 23 IU/L (ref 0–44)
AST: 23 IU/L (ref 0–40)
Albumin/Globulin Ratio: 1.4 (ref 1.2–2.2)
Albumin: 4.6 g/dL (ref 3.8–4.9)
Alkaline Phosphatase: 95 IU/L (ref 48–121)
BUN/Creatinine Ratio: 9 (ref 9–20)
BUN: 11 mg/dL (ref 6–24)
Bilirubin Total: 0.4 mg/dL (ref 0.0–1.2)
CO2: 24 mmol/L (ref 20–29)
Calcium: 9.6 mg/dL (ref 8.7–10.2)
Chloride: 99 mmol/L (ref 96–106)
Creatinine, Ser: 1.18 mg/dL (ref 0.76–1.27)
GFR calc Af Amer: 78 mL/min/{1.73_m2} (ref >59)
GFR calc non Af Amer: 67 mL/min/{1.73_m2} (ref >59)
Globulin, Total: 3.3 g/dL (ref 1.5–4.5)
Glucose: 84 mg/dL (ref 65–99)
Potassium: 4.5 mmol/L (ref 3.5–5.2)
Sodium: 138 mmol/L (ref 134–144)
Total Protein: 7.9 g/dL (ref 6.0–8.5)

## 2020-08-20 LAB — PSA: Prostate Specific Ag, Serum: 0.8 ng/mL (ref 0.0–4.0)

## 2020-08-20 LAB — TSH: TSH: 2.94 u[IU]/mL (ref 0.450–4.500)

## 2020-08-20 NOTE — Progress Notes (Signed)
Cardiology Office Note    Date:  08/20/2020   ID:  DEMORRIS Brandt, DOB [birth year], MRN [id number]   PCP:  Karle Plumber, MD  Cardiologist:  Chilton Si, MD  Electrophysiologist:  None   Evaluation Performed:  Follow-Up Visit  Chief Complaint:  hypertension  History of Present Illness:    Scott Brandt is a 59 y.o. male with hyperlipidemia, asthma, anxiety and TIA (2007) who presents for follow up.  He was initially seen 09/2017 to establish care.  He reported palpitations only occur in the setting of stressful situations.  The palpitations subside after the stressful stimulus is over.  He exercises 4 times per week and has no exertional symptoms.  No further testing was performed.  He was very hypertensive in clinic.  It was unclear if this was due to anxiety.  He was started on amlodipine 2.5 mg daily and asked to check his blood pressure at home.  He also had a coronary calcium score that showed one area of calcium in the left anterior descending.  His calcium score was 4, which is 44th percentile for age and gender.  It was recommended that he start taking his atorvastatin daily.  [name] stopped his amlodipine and was trying to control his BP with diet and exercise.  However his blood pressure has been quite elevated.  At his last appointment amlodipine was added at 5 mg daily.  His blood pressure continues to be elevated so amlodipine was increased to 10 mg at his last appointment.  He had an echocardiogram 02/11/2020 that revealed LVEF 55 to 60% with grade 1 diastolic dysfunction.  He had moderate LVH.  The echo was otherwise unremarkable.  He had a repeat echo 07/2020 that revealed LVEF 55 to 60% with grade 1 diastolic dysfunction and mild LVH.  At his last appointment [name] blood pressure remained a little elevated.  He wanted to work on diet and exercise prior to putting hydrochlorothiazide.  He continues to walk 3-4 miles on average. He feels good with walking and has no  chest pain or shortness of breath.  His BP at home has been mostly in the 120s. He denies LE edema, orthopnea or PND. He has itermittent fluttering a couple time in the last couple weeks.  It lasts for a couple minutes to thirty minutes.  He is unsure whether it is palpitations or a gurgling sensation.  He denies any associated lightheadedness or dizziness.  There is no chest pain or shortness of breath.  It typically occurs when sitting and not with exertion.  He has had a couple episodes of his left fingers getting numb when walking, but not particularly with strenuous exercise.   Past Medical History:  Diagnosis Date  . Asthma    signs of asthma from Dr Sherene Sires   . Asthma   . Essential hypertension 02/24/2020  . Hypercholesteremia   . Left ventricular hypertrophy 02/24/2020  . TIA (transient ischemic attack) 2007  . Urticaria    Past Surgical History:  Procedure Laterality Date  . FEMUR IM NAIL Left 02/28/2016   Procedure: INTRAMEDULLARY (IM) NAIL FEMORAL;  Surgeon: Durene Romans, MD;  Location: [facility] OR;  Service: Orthopedics;  Laterality: Left;     Current Meds  Medication Sig  . ALPRAZolam (XANAX) 0.5 MG tablet Take 0.5 mg by mouth at bedtime as needed for anxiety.  Marland Kitchen amLODipine (NORVASC) 10 MG tablet Take 1 tablet (10 mg total) by mouth daily.  Marland Kitchen aspirin 81  MG tablet Take 81 mg by mouth every other day.   Marland Kitchen atorvastatin (LIPITOR) 20 MG tablet Take 1 tablet (20 mg total) every other day by mouth.  . brimonidine (ALPHAGAN) 0.2 % ophthalmic solution brimonidine 0.2 % eye drops  INSTILL 1 DROP INTO BOTH EYES TWICE A DAY  . Cholecalciferol (D3 ADULT PO) Take by mouth.  . fexofenadine (ALLEGRA) 180 MG tablet Take 180 mg by mouth as needed.   . fluocinonide cream (LIDEX) 0.05 % APPLY 1 A SMALL AMOUNT APPLY ON THE SKIN TWICE A DAY AS NEEDED  . fluticasone (FLONASE) 50 MCG/ACT nasal spray fluticasone propionate 50 mcg/actuation nasal spray,suspension  . latanoprost (XALATAN) 0.005 % ophthalmic  solution latanoprost 0.005 % eye drops  INSTILL 1 DROP INTO BOTH EYES AT BEDTIME     Allergies:   Shellfish allergy   Social History   Tobacco Use  . Smoking status: Never Smoker  . Smokeless tobacco: Never Used  Substance Use Topics  . Alcohol use: Yes    Alcohol/week: 0.0 standard drinks    Comment: social  . Drug use: No     Family Hx: The patient's family history includes Allergic rhinitis in his father; Cancer in his sister; Diabetes in his sister; Hyperlipidemia in his brother and mother; Hypertension in his brother, mother, and sister; Rheum arthritis in his sister; Stroke in his maternal grandmother. There is no history of Angioedema, Asthma, Atopy, Eczema, Immunodeficiency, or Urticaria.  ROS:   Please see the history of present illness.     All other systems reviewed and are negative.   Prior CV studies:   The following studies were reviewed today:  Echo 02/11/20: 1. Left ventricular ejection fraction, by estimation, is 55 to 60%. The  left ventricle has normal function. The left ventricle has no regional  wall motion abnormalities. There is moderate concentric left ventricular  hypertrophy. Left ventricular  diastolic parameters are consistent with Grade I diastolic dysfunction  (impaired relaxation). Elevated left atrial pressure.  2. Right ventricular systolic function is normal. The right ventricular  size is normal. There is normal pulmonary artery systolic pressure. The  estimated right ventricular systolic pressure is 19.8 mmHg.  3. Left atrial size was moderately dilated.  4. The mitral valve is normal in structure and function. Trivial mitral  valve regurgitation. No evidence of mitral stenosis.  5. The aortic valve is normal in structure and function. Aortic valve  regurgitation is not visualized. Mild aortic valve sclerosis is present,  with no evidence of aortic valve stenosis.  6. The inferior vena cava is normal in size with greater than 50%   respiratory variability, suggesting right atrial pressure of 3 mmHg.   Echo 07/2020: 1. Left ventricular ejection fraction, by estimation, is 55 to 60%. The  left ventricle has normal function. The left ventricle has no regional  wall motion abnormalities. There is mild left ventricular hypertrophy.  Left ventricular diastolic parameters  are consistent with Grade I diastolic dysfunction (impaired relaxation).  2. Right ventricular systolic function is normal. The right ventricular  size is mildly enlarged. Tricuspid regurgitation signal is inadequate for  assessing PA pressure.  3. Right atrial size was mildly dilated.  4. The mitral valve is normal in structure. No evidence of mitral valve  regurgitation. No evidence of mitral stenosis.  5. The aortic valve is tricuspid. Aortic valve regurgitation is not  visualized. No aortic stenosis is present.  6. The inferior vena cava is normal in size with greater than 50%  respiratory variability, suggesting right atrial pressure of 3 mmHg.   Labs/Other Tests and Data Reviewed:    EKG:  An ECG dated 08/20/2020 was personally reviewed today and demonstrated:  Sinus bradycardia.  Rate 55 bpm.  Nonspecific T wave abnormalities.   Recent Labs: No results found for requested labs within last 8760 hours.   Recent Lipid Panel Lab Results  Component Value Date/Time   CHOL 146 06/20/2019 09:25 AM   TRIG 66 06/20/2019 09:25 AM   HDL 71 06/20/2019 09:25 AM   CHOLHDL 2.1 06/20/2019 09:25 AM   LDLCALC 62 06/20/2019 09:25 AM    Wt Readings from Last 3 Encounters:  08/20/20 228 lb 6.4 oz (103.6 kg)  04/06/20 229 lb (103.9 kg)  02/24/20 224 lb (101.6 kg)     Objective:    VS:  BP 134/88   Pulse (!) 55   Ht 6' (1.829 m)   Wt 228 lb 6.4 oz (103.6 kg)   BMI 30.98 kg/m  , BMI Body mass index is 30.98 kg/m. GENERAL:  Well appearing HEENT: Pupils equal round and reactive, fundi not visualized, oral mucosa unremarkable NECK:  No jugular  venous distention, waveform within normal limits, carotid upstroke brisk and symmetric, no bruits, no thyromegaly LYMPHATICS:  No cervical adenopathy LUNGS:  Clear to auscultation bilaterally HEART:  RRR.  PMI not displaced or sustained,S1 and S2 within normal limits, +S3, no S4, no clicks, no rubs, II/VI systolic murmurs ABD:  Flat, positive bowel sounds normal in frequency in pitch, no bruits, no rebound, no guarding, no midline pulsatile mass, no hepatomegaly, no splenomegaly EXT:  2 plus pulses throughout, no edema, no cyanosis no clubbing SKIN:  No rashes no nodules NEURO:  Cranial nerves II through XII grossly intact, motor grossly intact throughout PSYCH:  Cognitively intact, oriented to person place and time   ASSESSMENT & PLAN:    # Hypertension: Blood pressure is improving.  It has been mostly controlled at home.  He is going to keep working on diet and exercise.  Continue amlodipine.  Continue to limit sodium intake.  # Palpitations:  He had a few episodes lately.  We will check a TSH and CBC.  He will consider getting the [employer] app.  # Asymtpomatic coronary calcification.   # Hyperlipidemia:  Continue atorvastatin and aspirin.  Check lipids/CMP.  Time spent: 35 minutes-Greater than 50% of this time was spent in counseling, explanation of diagnosis, planning of further management, and coordination of care.  Medication Adjustments/Labs and Tests Ordered: Current medicines are reviewed at length with the patient today.  Concerns regarding medicines are outlined above.   Tests Ordered: Orders Placed This Encounter  Procedures  . Comprehensive metabolic panel  . Lipid panel  . PSA  . CBC with Differential/Platelet  . TSH  . EKG 12-Lead    Medication Changes: No orders of the defined types were placed in this encounter.   Follow Up: In 1 year  Signed, Chilton Si, MD  08/20/2020 10:34 AM    [facility]

## 2020-08-20 NOTE — Patient Instructions (Addendum)
Medication Instructions:  Your physician recommends that you continue on your current medications as directed. Please refer to the Current Medication list given to you today.  *If you need a refill on your cardiac medications before your next appointment, please call your pharmacy*  Lab Work: LP/CMET/PSA/TSH/CBC TODAY   If you have labs (blood work) drawn today and your tests are completely normal, you will receive your results only by: Marland Kitchen MyChart Message (if you have MyChart) OR . A paper copy in the mail If you have any lab test that is abnormal or we need to change your treatment, we will call you to review the results.  Testing/Procedures: NONE   Follow-Up: At [facility], you and your health needs are our priority.  As part of our continuing mission to provide you with exceptional heart care, we have created designated Provider Care Teams.  These Care Teams include your primary Cardiologist (physician) and Advanced Practice Providers (APPs -  Physician Assistants and Nurse Practitioners) who all work together to provide you with the care you need, when you need it.  We recommend signing up for the patient portal called "MyChart".  Sign up information is provided on this After Visit Summary.  MyChart is used to connect with patients for Virtual Visits (Telemedicine).  Patients are able to view lab/test results, encounter notes, upcoming appointments, etc.  Non-urgent messages can be sent to your provider as well.   To learn more about what you can do with MyChart, go to [email or web address].    Your next appointment:   12 month(s) You will receive a reminder letter in the mail two months in advance. If you don't receive a letter, please call our office to schedule the follow-up appointment.  The format for your next appointment:   In Person  Provider:   You may see Chilton Si, MD or one of the following Advanced Practice Providers on your designated Care Team:    Corine Shelter, PA-C  [place], [place]  Edd Fabian, [place]

## 2020-11-08 DIAGNOSIS — M25511 Pain in right shoulder: Secondary | ICD-10-CM | POA: Diagnosis not present

## 2020-11-08 DIAGNOSIS — M25512 Pain in left shoulder: Secondary | ICD-10-CM | POA: Diagnosis not present

## 2020-11-10 DIAGNOSIS — E7800 Pure hypercholesterolemia, unspecified: Secondary | ICD-10-CM | POA: Diagnosis not present

## 2020-11-10 DIAGNOSIS — E559 Vitamin D deficiency, unspecified: Secondary | ICD-10-CM | POA: Diagnosis not present

## 2020-11-10 DIAGNOSIS — E1165 Type 2 diabetes mellitus with hyperglycemia: Secondary | ICD-10-CM | POA: Diagnosis not present

## 2020-11-10 DIAGNOSIS — Z1159 Encounter for screening for other viral diseases: Secondary | ICD-10-CM | POA: Diagnosis not present

## 2020-11-10 DIAGNOSIS — I10 Essential (primary) hypertension: Secondary | ICD-10-CM | POA: Diagnosis not present

## 2020-11-10 DIAGNOSIS — Z20822 Contact with and (suspected) exposure to covid-19: Secondary | ICD-10-CM | POA: Diagnosis not present

## 2020-11-10 DIAGNOSIS — Z23 Encounter for immunization: Secondary | ICD-10-CM | POA: Diagnosis not present

## 2020-11-26 DIAGNOSIS — M25522 Pain in left elbow: Secondary | ICD-10-CM | POA: Diagnosis not present

## 2020-11-26 DIAGNOSIS — M25521 Pain in right elbow: Secondary | ICD-10-CM | POA: Diagnosis not present

## 2020-12-06 DIAGNOSIS — H04123 Dry eye syndrome of bilateral lacrimal glands: Secondary | ICD-10-CM | POA: Diagnosis not present

## 2020-12-06 DIAGNOSIS — H401132 Primary open-angle glaucoma, bilateral, moderate stage: Secondary | ICD-10-CM | POA: Diagnosis not present

## 2020-12-06 DIAGNOSIS — H2513 Age-related nuclear cataract, bilateral: Secondary | ICD-10-CM | POA: Diagnosis not present

## 2020-12-06 DIAGNOSIS — H43811 Vitreous degeneration, right eye: Secondary | ICD-10-CM | POA: Diagnosis not present

## 2020-12-15 ENCOUNTER — Telehealth (INDEPENDENT_AMBULATORY_CARE_PROVIDER_SITE_OTHER): Payer: BC Managed Care – PPO | Admitting: Cardiovascular Disease

## 2020-12-15 ENCOUNTER — Encounter: Payer: Self-pay | Admitting: Cardiovascular Disease

## 2020-12-15 VITALS — Ht 72.0 in | Wt 229.0 lb

## 2020-12-15 DIAGNOSIS — E7800 Pure hypercholesterolemia, unspecified: Secondary | ICD-10-CM

## 2020-12-15 DIAGNOSIS — S7222XD Displaced subtrochanteric fracture of left femur, subsequent encounter for closed fracture with routine healing: Secondary | ICD-10-CM | POA: Diagnosis not present

## 2020-12-15 DIAGNOSIS — I517 Cardiomegaly: Secondary | ICD-10-CM

## 2020-12-15 DIAGNOSIS — I10 Essential (primary) hypertension: Secondary | ICD-10-CM

## 2020-12-15 DIAGNOSIS — Z4789 Encounter for other orthopedic aftercare: Secondary | ICD-10-CM | POA: Diagnosis not present

## 2020-12-15 DIAGNOSIS — R42 Dizziness and giddiness: Secondary | ICD-10-CM | POA: Insufficient documentation

## 2020-12-15 HISTORY — DX: Dizziness and giddiness: R42

## 2020-12-15 NOTE — Progress Notes (Signed)
Virtual Visit via Video Note   This visit type was conducted due to national recommendations for restrictions regarding the COVID-19 Pandemic (e.g. social distancing) in an effort to limit this patient's exposure and mitigate transmission in our community.  Due to his co-morbid illnesses, this patient is at least at moderate risk for complications without adequate follow up.  This format is felt to be most appropriate for this patient at this time.  All issues noted in this document were discussed and addressed.  A limited physical exam was performed with this format.  Please refer to the patient's chart for his consent to telehealth for [name].  The patient was identified using 2 identifiers.  Date:  12/15/2020   ID:  Scott Brandt, DOB Oct 29, 1961, MRN [id number]  Patient Location: Home Provider Location: Office/Clinic  PCP:  Karle Plumber, MD  Cardiologist:  Chilton Si, MD  Electrophysiologist:  None   Evaluation Performed:  Follow-Up Visit  Chief Complaint:  hypertension  History of Present Illness:     The patient does not have symptoms concerning for COVID-19 infection (fever, chills, cough, or new shortness of breath).   Scott Brandt is a 59 y.o. male with hyperlipidemia, asthma, anxiety and TIA (2007) who presents for follow up.  He was initially seen 09/2017 to establish care.  He reported palpitations only occur in the setting of stressful situations.  The palpitations subside after the stressful stimulus is over.  He exercises 4 times per week and has no exertional symptoms.  No further testing was performed.  He was very hypertensive in clinic.  It was unclear if this was due to anxiety.  He was started on amlodipine 2.5 mg daily and asked to check his blood pressure at home.  He also had a coronary calcium score that showed one area of calcium in the left anterior descending.  His calcium score was 4, which is 44th percentile for age and gender.  It was  recommended that he start taking his atorvastatin daily.  Scott Brandt stopped his amlodipine and was trying to control his BP with diet and exercise.  However his blood pressure has been quite elevated.  At his last appointment amlodipine was added at 5 mg daily.  His blood pressure continues to be elevated so amlodipine was increased to 10 mg at his last appointment.  He had an echocardiogram 02/11/2020 that revealed LVEF 55 to 60% with grade 1 diastolic dysfunction.  He had moderate LVH.  The echo was otherwise unremarkable.  He had a repeat echo 07/2020 that revealed LVEF 55 to 60% with grade 1 diastolic dysfunction and mild LVH.  At his last appointment Scott Brandt blood pressure remained a little elevated.  He wanted to work on diet and exercise prior to putting hydrochlorothiazide.  He continues to walk 3-4 miles on average. He feels good with walking and has no chest pain or shortness of breath.  His BP at home has been mostly in the 120s. He denies LE edema, orthopnea or PND. He had intermittent fluttering and planned to get the AliveCor app.  Lately Scott Brandt had a couple weeks of lightheadedness.  He didn't have syncope but had orthostatic symptoms.  He didn't check his BP at the time.  He noted that he was taking his amlodipine erratically so he started taking it at the same time.  He also started drinking more water.  Since then his symptoms have subsided.  He has generally felt well and  continues to walk regularly, though not as much since the weather has been colder.  He has been doing some running as well and feels OK.  He has no chest pain and his breathing has been stable.  He denies LE edema, orthopnea or PND.    Past Medical History:  Diagnosis Date  . Asthma    signs of asthma from Dr Sherene SiresWert   . Asthma   . Essential hypertension 02/24/2020  . Hypercholesteremia   . Left ventricular hypertrophy 02/24/2020  . TIA (transient ischemic attack) 2007  . Urticaria    Past Surgical History:   Procedure Laterality Date  . FEMUR IM NAIL Left 02/28/2016   Procedure: INTRAMEDULLARY (IM) NAIL FEMORAL;  Surgeon: Durene RomansMatthew Olin, MD;  Location: [facility] OR;  Service: Orthopedics;  Laterality: Left;     Current Meds  Medication Sig  . ALPRAZolam (XANAX) 0.5 MG tablet Take 0.5 mg by mouth at bedtime as needed for anxiety.  Marland Kitchen. amLODipine (NORVASC) 10 MG tablet Take 1 tablet (10 mg total) by mouth daily.  Marland Kitchen. aspirin 81 MG tablet Take 81 mg by mouth every other day.   Marland Kitchen. atorvastatin (LIPITOR) 20 MG tablet Take 1 tablet (20 mg total) every other day by mouth.  . brimonidine (ALPHAGAN) 0.2 % ophthalmic solution brimonidine 0.2 % eye drops  INSTILL 1 DROP INTO BOTH EYES TWICE A DAY  . Cholecalciferol (D3 ADULT PO) Take by mouth.  . fexofenadine (ALLEGRA) 180 MG tablet Take 180 mg by mouth as needed.   . fluocinonide cream (LIDEX) 0.05 % APPLY 1 A SMALL AMOUNT APPLY ON THE SKIN TWICE A DAY AS NEEDED  . fluticasone (FLONASE) 50 MCG/ACT nasal spray Place into both nostrils as needed.  . latanoprost (XALATAN) 0.005 % ophthalmic solution latanoprost 0.005 % eye drops  INSTILL 1 DROP INTO BOTH EYES AT BEDTIME     Allergies:   Shellfish allergy   Social History   Tobacco Use  . Smoking status: Never Smoker  . Smokeless tobacco: Never Used  Substance Use Topics  . Alcohol use: Yes    Alcohol/week: 0.0 standard drinks    Comment: social  . Drug use: No     Family Hx: The patient's family history includes Allergic rhinitis in his father; Cancer in his sister; Diabetes in his sister; Hyperlipidemia in his brother and mother; Hypertension in his brother, mother, and sister; Rheum arthritis in his sister; Stroke in his maternal grandmother. There is no history of Angioedema, Asthma, Atopy, Eczema, Immunodeficiency, or Urticaria.  ROS:   Please see the history of present illness.     All other systems reviewed and are negative.   Prior CV studies:   The following studies were reviewed today:  Echo  02/11/20: 1. Left ventricular ejection fraction, by estimation, is 55 to 60%. The  left ventricle has normal function. The left ventricle has no regional  wall motion abnormalities. There is moderate concentric left ventricular  hypertrophy. Left ventricular  diastolic parameters are consistent with Grade I diastolic dysfunction  (impaired relaxation). Elevated left atrial pressure.  2. Right ventricular systolic function is normal. The right ventricular  size is normal. There is normal pulmonary artery systolic pressure. The  estimated right ventricular systolic pressure is 19.8 mmHg.  3. Left atrial size was moderately dilated.  4. The mitral valve is normal in structure and function. Trivial mitral  valve regurgitation. No evidence of mitral stenosis.  5. The aortic valve is normal in structure and function. Aortic valve  regurgitation is not visualized. Mild aortic valve sclerosis is present,  with no evidence of aortic valve stenosis.  6. The inferior vena cava is normal in size with greater than 50%  respiratory variability, suggesting right atrial pressure of 3 mmHg.   Echo 07/2020: 1. Left ventricular ejection fraction, by estimation, is 55 to 60%. The  left ventricle has normal function. The left ventricle has no regional  wall motion abnormalities. There is mild left ventricular hypertrophy.  Left ventricular diastolic parameters  are consistent with Grade I diastolic dysfunction (impaired relaxation).  2. Right ventricular systolic function is normal. The right ventricular  size is mildly enlarged. Tricuspid regurgitation signal is inadequate for  assessing PA pressure.  3. Right atrial size was mildly dilated.  4. The mitral valve is normal in structure. No evidence of mitral valve  regurgitation. No evidence of mitral stenosis.  5. The aortic valve is tricuspid. Aortic valve regurgitation is not  visualized. No aortic stenosis is present.  6. The inferior vena  cava is normal in size with greater than 50%  respiratory variability, suggesting right atrial pressure of 3 mmHg.   Labs/Other Tests and Data Reviewed:    EKG:  An ECG dated 08/20/2020 was personally reviewed today and demonstrated:  Sinus bradycardia.  Rate 55 bpm.  Nonspecific T wave abnormalities.   Recent Labs: 08/20/2020: ALT 23; BUN 11; Creatinine, Ser 1.18; Hemoglobin 15.1; Platelets 272; Potassium 4.5; Sodium 138; TSH 2.940   Recent Lipid Panel Lab Results  Component Value Date/Time   CHOL 169 08/20/2020 09:30 AM   TRIG 71 08/20/2020 09:30 AM   HDL 76 08/20/2020 09:30 AM   CHOLHDL 2.2 08/20/2020 09:30 AM   LDLCALC 79 08/20/2020 09:30 AM    Wt Readings from Last 3 Encounters:  12/15/20 229 lb (103.9 kg)  08/20/20 228 lb 6.4 oz (103.6 kg)  04/06/20 229 lb (103.9 kg)     Objective:    Ht 6' (1.829 m)   Wt 229 lb (103.9 kg)   BMI 31.06 kg/m  GENERAL: Well-appearing.  No acute distress. HEENT: Pupils equal round.  Oral mucosa unremarkable NECK:  No jugular venous distention, no visible thyromegaly EXT:  No edema, no cyanosis no clubbing SKIN:  No rashes no nodules NEURO:  Speech fluent.  Cranial nerves grossly intact.  Moves all 4 extremities freely PSYCH:  Cognitively intact, oriented to person place and time  ASSESSMENT & PLAN:    # Hypertension: Blood pressure is improving.  It has been mostly controlled at home and at MD offices.  He is going to keep working on diet and exercise.  Encouraged him to  Continue amlodipine.  Continue to limit sodium intake.  # Palpitations:  He had a few episodes lately.  We will check a TSH and CBC.  He will consider getting the AliveCor app.  # Asymtpomatic coronary calcification.   # Hyperlipidemia:  Continue atorvastatin and aspirin.  Check lipids/CMP.  # Lightheadedness: Symptoms have resolved with hydration and taking medication on time.  Time spent: 35 minutes-Greater than 50% of this time was spent in counseling,  explanation of diagnosis, planning of further management, and coordination of care.  Medication Adjustments/Labs and Tests Ordered: Current medicines are reviewed at length with the patient today.  Concerns regarding medicines are outlined above.    COVID-19 Education: The signs and symptoms of COVID-19 were discussed with the patient and how to seek care for testing (follow up with PCP or arrange E-visit).  The importance of  social distancing was discussed today.  Time:   Today, I have spent 21 minutes with the patient with telehealth technology discussing the above problems.     Tests Ordered: No orders of the defined types were placed in this encounter.   Medication Changes: No orders of the defined types were placed in this encounter.   Follow Up: In 1 year  Signed, Chilton Si, MD  12/15/2020 9:01 AM    [facility]

## 2020-12-15 NOTE — Patient Instructions (Signed)
Medication Instructions:  Your physician recommends that you continue on your current medications as directed. Please refer to the Current Medication list given to you today.   *If you need a refill on your cardiac medications before your next appointment, please call your pharmacy*  Lab Work: NONE   Testing/Procedures: NONE  Follow-Up: At [facility], you and your health needs are our priority.  As part of our continuing mission to provide you with exceptional heart care, we have created designated Provider Care Teams.  These Care Teams include your primary Cardiologist (physician) and Advanced Practice Providers (APPs -  Physician Assistants and Nurse Practitioners) who all work together to provide you with the care you need, when you need it.  We recommend signing up for the patient portal called "MyChart".  Sign up information is provided on this After Visit Summary.  MyChart is used to connect with patients for Virtual Visits (Telemedicine).  Patients are able to view lab/test results, encounter notes, upcoming appointments, etc.  Non-urgent messages can be sent to your provider as well.   To learn more about what you can do with MyChart, go to [email or web address].    Your next appointment:   12 month(s) You will receive a reminder letter in the mail two months in advance. If you don't receive a letter, please call our office to schedule the follow-up appointment.   The format for your next appointment:   In Person  Provider:   You may see Tiffany Kent, MD or one of the following Advanced Practice Providers on your designated Care Team:    Luke Kilroy, PA-C  Callie Goodrich, PA-C  Jesse Cleaver, FNP    

## 2021-03-17 DIAGNOSIS — L2084 Intrinsic (allergic) eczema: Secondary | ICD-10-CM | POA: Diagnosis not present

## 2021-07-12 ENCOUNTER — Other Ambulatory Visit: Payer: Self-pay | Admitting: Cardiovascular Disease

## 2021-09-23 ENCOUNTER — Telehealth: Payer: Self-pay | Admitting: Cardiovascular Disease

## 2021-09-23 NOTE — Telephone Encounter (Signed)
Attempt to return call to patient-left message making aware Melinda OOO today.  Call back if he would like to discuss further.

## 2021-09-23 NOTE — Telephone Encounter (Signed)
Patient calling to speak with Scott Brandt about a medication. Patient states it is not a refill or a medication reaction and did not want to give any further information.

## 2021-09-26 NOTE — Telephone Encounter (Signed)
Spoke with patient and scheduled follow up in December as requested

## 2021-10-26 ENCOUNTER — Emergency Department (HOSPITAL_BASED_OUTPATIENT_CLINIC_OR_DEPARTMENT_OTHER): Payer: BC Managed Care – PPO

## 2021-10-26 ENCOUNTER — Inpatient Hospital Stay (HOSPITAL_BASED_OUTPATIENT_CLINIC_OR_DEPARTMENT_OTHER)
Admission: EM | Admit: 2021-10-26 | Discharge: 2021-10-28 | DRG: 041 | Disposition: A | Discharge status: Home / Self Care | Payer: BC Managed Care – PPO | Attending: Neurology | Admitting: Neurology

## 2021-10-26 ENCOUNTER — Other Ambulatory Visit: Payer: Self-pay

## 2021-10-26 ENCOUNTER — Encounter (HOSPITAL_BASED_OUTPATIENT_CLINIC_OR_DEPARTMENT_OTHER): Payer: Self-pay

## 2021-10-26 DIAGNOSIS — Q2112 Patent foramen ovale: Secondary | ICD-10-CM

## 2021-10-26 DIAGNOSIS — Z6830 Body mass index (BMI) 30.0-30.9, adult: Secondary | ICD-10-CM

## 2021-10-26 DIAGNOSIS — E669 Obesity, unspecified: Secondary | ICD-10-CM | POA: Diagnosis present

## 2021-10-26 DIAGNOSIS — E7800 Pure hypercholesterolemia, unspecified: Secondary | ICD-10-CM | POA: Diagnosis present

## 2021-10-26 DIAGNOSIS — I639 Cerebral infarction, unspecified: Principal | ICD-10-CM | POA: Diagnosis present

## 2021-10-26 DIAGNOSIS — R29703 NIHSS score 3: Secondary | ICD-10-CM | POA: Diagnosis present

## 2021-10-26 DIAGNOSIS — Z83438 Family history of other disorder of lipoprotein metabolism and other lipidemia: Secondary | ICD-10-CM

## 2021-10-26 DIAGNOSIS — Z79899 Other long term (current) drug therapy: Secondary | ICD-10-CM | POA: Diagnosis not present

## 2021-10-26 DIAGNOSIS — Z7982 Long term (current) use of aspirin: Secondary | ICD-10-CM | POA: Diagnosis not present

## 2021-10-26 DIAGNOSIS — J302 Other seasonal allergic rhinitis: Secondary | ICD-10-CM | POA: Diagnosis present

## 2021-10-26 DIAGNOSIS — R29810 Facial weakness: Secondary | ICD-10-CM | POA: Diagnosis present

## 2021-10-26 DIAGNOSIS — I69320 Aphasia following cerebral infarction: Secondary | ICD-10-CM

## 2021-10-26 DIAGNOSIS — I63412 Cerebral infarction due to embolism of left middle cerebral artery: Secondary | ICD-10-CM | POA: Diagnosis not present

## 2021-10-26 DIAGNOSIS — Z8249 Family history of ischemic heart disease and other diseases of the circulatory system: Secondary | ICD-10-CM | POA: Diagnosis not present

## 2021-10-26 DIAGNOSIS — Z91013 Allergy to seafood: Secondary | ICD-10-CM

## 2021-10-26 DIAGNOSIS — Z20822 Contact with and (suspected) exposure to covid-19: Secondary | ICD-10-CM | POA: Diagnosis present

## 2021-10-26 DIAGNOSIS — R4702 Dysphasia: Secondary | ICD-10-CM | POA: Diagnosis present

## 2021-10-26 DIAGNOSIS — I10 Essential (primary) hypertension: Secondary | ICD-10-CM | POA: Diagnosis present

## 2021-10-26 DIAGNOSIS — Z8673 Personal history of transient ischemic attack (TIA), and cerebral infarction without residual deficits: Secondary | ICD-10-CM | POA: Diagnosis not present

## 2021-10-26 DIAGNOSIS — I6389 Other cerebral infarction: Secondary | ICD-10-CM | POA: Diagnosis not present

## 2021-10-26 LAB — CBC
HCT: 40.9 % (ref 39.0–52.0)
Hemoglobin: 13.5 g/dL (ref 13.0–17.0)
MCH: 29.7 pg (ref 26.0–34.0)
MCHC: 33 g/dL (ref 30.0–36.0)
MCV: 90.1 fL (ref 80.0–100.0)
Platelets: 286 10*3/uL (ref 150–400)
RBC: 4.54 MIL/uL (ref 4.22–5.81)
RDW: 14 % (ref 11.5–15.5)
WBC: 5.2 10*3/uL (ref 4.0–10.5)
nRBC: 0 % (ref 0.0–0.2)

## 2021-10-26 LAB — COMPREHENSIVE METABOLIC PANEL
ALT: 26 U/L (ref 0–44)
AST: 24 U/L (ref 15–41)
Albumin: 3.9 g/dL (ref 3.5–5.0)
Alkaline Phosphatase: 92 U/L (ref 38–126)
Anion gap: 9 (ref 5–15)
BUN: 11 mg/dL (ref 6–20)
CO2: 26 mmol/L (ref 22–32)
Calcium: 9 mg/dL (ref 8.9–10.3)
Chloride: 103 mmol/L (ref 98–111)
Creatinine, Ser: 1.27 mg/dL — ABNORMAL HIGH (ref 0.61–1.24)
GFR, Estimated: >60 mL/min (ref >60)
Glucose, Bld: 91 mg/dL (ref 70–99)
Potassium: 3.7 mmol/L (ref 3.5–5.1)
Sodium: 138 mmol/L (ref 135–145)
Total Bilirubin: 0.2 mg/dL — ABNORMAL LOW (ref 0.3–1.2)
Total Protein: 7.5 g/dL (ref 6.5–8.1)

## 2021-10-26 LAB — APTT: aPTT: 24 seconds (ref 24–36)

## 2021-10-26 LAB — PROTIME-INR
INR: 1 (ref 0.8–1.2)
Prothrombin Time: 12.8 seconds (ref 11.4–15.2)

## 2021-10-26 LAB — MRSA NEXT GEN BY PCR, NASAL: MRSA by PCR Next Gen: NOT DETECTED

## 2021-10-26 LAB — DIFFERENTIAL
Abs Immature Granulocytes: 0.01 10*3/uL (ref 0.00–0.07)
Basophils Absolute: 0.1 10*3/uL (ref 0.0–0.1)
Basophils Relative: 1 %
Eosinophils Absolute: 0.1 10*3/uL (ref 0.0–0.5)
Eosinophils Relative: 2 %
Immature Granulocytes: 0 %
Lymphocytes Relative: 42 %
Lymphs Abs: 2.2 10*3/uL (ref 0.7–4.0)
Monocytes Absolute: 0.5 10*3/uL (ref 0.1–1.0)
Monocytes Relative: 10 %
Neutro Abs: 2.4 10*3/uL (ref 1.7–7.7)
Neutrophils Relative %: 45 %

## 2021-10-26 LAB — RESP PANEL BY RT-PCR (FLU A&B, COVID) ARPGX2
Influenza A by PCR: NEGATIVE
Influenza B by PCR: NEGATIVE
SARS Coronavirus 2 by RT PCR: NEGATIVE

## 2021-10-26 LAB — CBG MONITORING, ED: Glucose-Capillary: 121 mg/dL — ABNORMAL HIGH (ref 70–99)

## 2021-10-26 MED ORDER — CHLORHEXIDINE GLUCONATE 0.12 % MT SOLN
15.0000 mL | Freq: Two times a day (BID) | OROMUCOSAL | Status: DC
  Administered 2021-10-26 – 2021-10-28 (×4): 15 mL via OROMUCOSAL
  Filled 2021-10-26 (×2): qty 15

## 2021-10-26 MED ORDER — ACETAMINOPHEN 650 MG RE SUPP: 650.0000 mg | RECTAL | Status: DC | PRN

## 2021-10-26 MED ORDER — SODIUM CHLORIDE 0.9 % IV SOLN: INTRAVENOUS | Status: DC

## 2021-10-26 MED ORDER — PANTOPRAZOLE SODIUM 40 MG IV SOLR
40.0000 mg | Freq: Every day | INTRAVENOUS | Status: DC
  Administered 2021-10-26: 40 mg via INTRAVENOUS
  Filled 2021-10-26: qty 40

## 2021-10-26 MED ORDER — STROKE: EARLY STAGES OF RECOVERY BOOK
Freq: Once | Status: AC
  Filled 2021-10-26: qty 1

## 2021-10-26 MED ORDER — SODIUM CHLORIDE 0.9% FLUSH
3.0000 mL | Freq: Once | INTRAVENOUS | Status: AC
  Administered 2021-10-26: 3 mL via INTRAVENOUS
  Filled 2021-10-26: qty 3

## 2021-10-26 MED ORDER — CLEVIDIPINE BUTYRATE 0.5 MG/ML IV EMUL
0.0000 mg/h | INTRAVENOUS | Status: DC
  Filled 2021-10-26: qty 100

## 2021-10-26 MED ORDER — ACETAMINOPHEN 325 MG PO TABS: 650.0000 mg | ORAL_TABLET | ORAL | Status: DC | PRN

## 2021-10-26 MED ORDER — TENECTEPLASE FOR STROKE: 25.0000 mg | PACK | Freq: Once | INTRAVENOUS | Status: AC

## 2021-10-26 MED ORDER — SENNOSIDES-DOCUSATE SODIUM 8.6-50 MG PO TABS
1.0000 | ORAL_TABLET | Freq: Two times a day (BID) | ORAL | Status: DC
  Filled 2021-10-26: qty 1

## 2021-10-26 MED ORDER — TENECTEPLASE FOR STROKE
PACK | INTRAVENOUS | Status: AC
  Administered 2021-10-26: 25 mg via INTRAVENOUS
  Filled 2021-10-26: qty 10

## 2021-10-26 MED ORDER — ACETAMINOPHEN 160 MG/5ML PO SOLN: 650.0000 mg | ORAL | Status: DC | PRN

## 2021-10-26 MED ORDER — IOHEXOL 350 MG/ML SOLN
100.0000 mL | Freq: Once | INTRAVENOUS | Status: AC | PRN
  Administered 2021-10-26: 100 mL via INTRAVENOUS

## 2021-10-26 MED ORDER — LORAZEPAM 2 MG/ML IJ SOLN: 1.0000 mg | Freq: Once | INTRAMUSCULAR | Status: DC

## 2021-10-26 NOTE — Consult Note (Signed)
[facility] TeleNeurology Consult Services    Date of Service:  11/05/2021     Metrics: Last Known Well: 1200 Symptoms: As per HPI.  Patient is a candidate for thrombolytic.   Location of the provider: [facility]  Location of the patient: [facility] Pre-Morbid Modified Rankin Scale: 0  This consult was provided via telemedicine with 2-way video and audio communication. The patient/family was informed that care would be provided in this way and agreed to receive care in this manner.   ED Physician notified of diagnostic impression and management plan at: 4:15 PM   Assessment: 60 year old male presenting with acute onset of dysphasia and right facial droop  -Exam reveals findings localizable to the left frontal operculum -CT without acute abnormality. There is an old left frontal lobe small cortically based stroke that is in the region of the left frontal operculum and which may be contributing to his presentation, but given acute change cannot account fully for his deficits.  - CTA of head and neck: Normal CT angiography of the head and neck. No large vessel occlusion. No proximal stenosis. No carotid bifurcation disease.  - After comprehensive review of possible contraindications, he has no absolute contraindications to TNK administration. - Patient is an IV thrombolysis candidate. Discussed extensively the risks/benefits of IV thrombolysis treatment vs. no treatment with the patient and his wife, including risks of hemorrhage and death with IV thrombolysis administration versus worse overall outcomes on average in patients within thrombolysis time window who are not administered TNK.  Overall benefits of TNK regarding long-term prognosis are felt to outweigh risks. The patient and his wife expressed understanding and wish to proceed with TNK     Recommendations: 1. Transferring to [facility] for admission to the Neuro ICU. Dr. Wilford Corner has been notified.    2. Post-TNK order set to include frequent neuro checks and BP management.  3. No antiplatelet medications or anticoagulants for at least 24 hours following TNK.  4. DVT prophylaxis with SCDs.  5. Will need to be continued on his statin. Consider increasing atorvastatin dose to 40 mg po qd.  6. Will need escalation of antiplatelet therapy if follow up CT at 24 hours is negative for hemorrhagic conversion. 7. TTE.  8. MRI brain  9. Cardiac telemetry 10. PT/OT/Speech.  11. NPO until passes swallow evaluation.  12. Fasting lipid panel, HgbA1c  ------------------------------------------------------------------------------   History of Present Illness: The patient is a 60 year old male with prior history of TIA (2006). Asthma, HTN and hypercholesterolemia, who presented to the [facility] ED for assessment of acute onset dysphasia with right facial droop. He was LKN at 1200. Earlier today, he had been on a Zoom call for training and was normal. Afterwards, he went out to blow some leaves without any symptoms at that time. Wife was out of the home, but when she came back, the patient told her that he started to feel strange and not himself at 1 PM, but did not complain of any focal deficit. Later, he was trying to speak to his wife but could not "get the words together". Wife also noted a mild right facial droop, so she decided to take him to [facility]. In the ED his speech improved slightly, but he continued to have word finding difficulties intermittently.   He states that earlier today, he had a left sided headache consisting of sharp pain that then subsided. He no longer has a headache. He denies any limb weakness  or vision change. Does not endorse any dizziness, lightheadedness or difficulty ambulating. He has had some gum bleeding in the mornings for the past 4 months for which he has scheduled an appointment with a dentist.   Home medications include ASA and atorvastatin (20 mg qd). He is not on a blood  thinner.      Past Medical History: Past Medical History:  Diagnosis Date   Asthma    signs of asthma from Dr Melvyn Novas    Asthma    Essential hypertension 02/24/2020   Hypercholesteremia    Left ventricular hypertrophy 02/24/2020   Lightheadedness 12/15/2020   TIA (transient ischemic attack) 2007   Urticaria      Past Surgical History: Past Surgical History:  Procedure Laterality Date   FEMUR IM NAIL Left 02/28/2016   Procedure: INTRAMEDULLARY (IM) NAIL FEMORAL;  Surgeon: [name], MD;  Location: [facility];  Service: Orthopedics;  Laterality: Left;      Home Medications:   No current facility-administered medications on file prior to encounter.   Current Outpatient Medications on File Prior to Encounter  Medication Sig Dispense Refill   ALPRAZolam (XANAX) 0.5 MG tablet Take 0.5 mg by mouth at bedtime as needed for anxiety.     amLODipine (NORVASC) 10 MG tablet TAKE 1 TABLET DAILY 90 tablet 3   aspirin 81 MG tablet Take 81 mg by mouth every other day.      atorvastatin (LIPITOR) 20 MG tablet Take 1 tablet (20 mg total) every other day by mouth. 90 tablet 3   brimonidine (ALPHAGAN) 0.2 % ophthalmic solution brimonidine 0.2 % eye drops  INSTILL 1 DROP INTO BOTH EYES TWICE A DAY     Cholecalciferol (D3 ADULT PO) Take by mouth.     fexofenadine (ALLEGRA) 180 MG tablet Take 180 mg by mouth as needed.      fluocinonide cream (LIDEX) 0.05 % APPLY 1 A SMALL AMOUNT APPLY ON THE SKIN TWICE A DAY AS NEEDED     fluticasone (FLONASE) 50 MCG/ACT nasal spray Place into both nostrils as needed.     latanoprost (XALATAN) 0.005 % ophthalmic solution latanoprost 0.005 % eye drops  INSTILL 1 DROP INTO BOTH EYES AT BEDTIME           Social History: Drug Use: None Tobacco Use: None EtOH: Rare social drinking   Family History:  Reviewed in Epic   ROS: As per HPI    Anticoagulant use:  No   Antiplatelet use: ASA   Examination:  BP (!) 156/96   Pulse 60   Temp 98.5 F (36.9 C) (Oral)    Resp 16   Ht 6' (1.829 m)   Wt 102.1 kg   SpO2 95%   BMI 30.52 kg/m     Speech with decreased fluency and mild dysarthria. Has frequent pauses for word-finding and slight garbling of speech intermittently. He appears frustrated by the speech deficit. Comprehension intact. Naming of objects is intact in isolation, but word finding deficit occurs during sentence formation. Right facial droop. Oriented x 5. Good insight.    1A: Level of Consciousness - 0 1B: Ask Month and Age - 0 1C: Blink Eyes & Squeeze Hands - 0 2: Test Horizontal Extraocular Movements - 0 3: Test Visual Fields - 0 4: Test Facial Palsy (Use Grimace if Obtunded) - 1 5A: Test Left Arm Motor Drift - 0 5B: Test Right Arm Motor Drift - 0 6A: Test Left Leg Motor Drift - 0 6B: Test Right Leg Motor  Drift - 0 7: Test Limb Ataxia (FNF/Heel-Shin) - 0 8: Test Sensation -  0 9: Test Language/Aphasia - 1 10: Test Dysarthria - Severe Dysarthria: 1 11: Test Extinction/Inattention - Extinction to bilateral simultaneous stimulation 0   NIHSS Score: 3     Patient/Family was informed the Neurology Consult would occur via TeleHealth consult by way of interactive audio and video telecommunications and consented to receiving care in this manner.   Patient is being evaluated for possible acute neurologic impairment and high pretest probability of imminent or life-threatening deterioration. I spent total of 40 minutes providing care to this patient, including time for face to face visit via telemedicine, review of medical records, imaging studies and discussion of findings with providers, the patient and/or family.   Electronically signed: Dr. Kerney Elbe

## 2021-10-26 NOTE — H&P (Signed)
Neurology H&P  CC: Code stroke at [facility] s/p TNK administration.   History is obtained from: chart, patient.   HPI: Scott Brandt is a 60 y.o. male with a PMHx of TIA 2006, asthma, HTN, and HLD who presented to [facility] ED for assessment of acute dysphasia and right facial droop. LKW 1200.  Per Neuro tele note by Dr. Cheral Marker, "Earlier today, he had been on a Zoom call for training and was normal. Afterwards, he went out to blow some leaves without any symptoms at that time. Wife was out of the home, but when she came back, the patient told her that he started to feel strange and not himself at 1 PM, but did not complain of any focal deficit. Later, he was trying to speak to his wife but could not "get the words together". Wife also noted a mild right facial droop, so she decided to take him to [facility]. In the ED his speech improved slightly, but he continued to have word finding difficulties intermittently. "   "He states that earlier today, he had a left sided headache consisting of sharp pain that then subsided. He no longer has a headache. He denies any limb weakness or vision change. Does not endorse any dizziness, lightheadedness or difficulty ambulating. He has had some gum bleeding in the mornings for the past 4 months for which he has scheduled an appointment with a dentist. "   "Home medications include ASA and atorvastatin (20 mg qd). He is not on a blood thinner."  Wife reports possible "born with hole in his heart" that was told by cards when he had a speech related "TIA" in 2007.  NIHSS was 3-1 for dysarthria, 1 for aphasia, and 1 for facial palsy.   Patient received TNK per tele consult and was transferred here for post TNK admission to ICU.    LKW: 1200 TNK: yes at [facility].  IR Thrombectomy? No, not a candidate.  MRS: 0   ROS: A robust ROS was negative except as noted in HPI.   Past Medical History:  Diagnosis Date   Asthma    signs of asthma from Dr Melvyn Novas    Asthma    Essential  hypertension 02/24/2020   Hypercholesteremia    Left ventricular hypertrophy 02/24/2020   Lightheadedness 12/15/2020   TIA (transient ischemic attack) 2007   Urticaria     Family History  Problem Relation Age of Onset   Allergic rhinitis Father    Hyperlipidemia Mother    Hypertension Mother    Hypertension Sister    Diabetes Sister    Hypertension Brother    Hyperlipidemia Brother    Rheum arthritis Sister    Cancer Sister    Stroke Maternal Grandmother    Angioedema Neg Hx    Asthma Neg Hx    Atopy Neg Hx    Eczema Neg Hx    Immunodeficiency Neg Hx    Urticaria Neg Hx     Social History:  reports that he has never smoked. He has never used smokeless tobacco. He reports current alcohol use. He reports that he does not use drugs.  Prior to Admission medications   Medication Sig Start Date End Date Taking? Authorizing Provider  ALPRAZolam Duanne Moron) 0.5 MG tablet Take 0.5 mg by mouth at bedtime as needed for anxiety.   Yes [provider]  amLODipine (NORVASC) 10 MG tablet TAKE 1 TABLET DAILY Patient taking differently: Take 10 mg by mouth daily. 07/12/21  Yes [name],  MD  aspirin 81 MG tablet Take 81 mg by mouth every other day.    Yes [provider]  atorvastatin (LIPITOR) 20 MG tablet Take 1 tablet (20 mg total) every other day by mouth. Patient taking differently: Take 20 mg by mouth daily. 11/12/17  Yes Chilton Si, MD  brimonidine (ALPHAGAN) 0.2 % ophthalmic solution Place 1 drop into both eyes 2 (two) times daily.   Yes [provider]  fexofenadine (ALLEGRA) 180 MG tablet Take 180 mg by mouth daily as needed for allergies.   Yes [provider]  latanoprost (XALATAN) 0.005 % ophthalmic solution Place 1 drop into both eyes at bedtime.   Yes [provider]   Exam: Current vital signs: BP (!) 160/94   Pulse (!) 55   Temp 98.5 F (36.9 C) (Oral)   Resp 11   Ht 6' (1.829 m)   Wt 102.1 kg   SpO2 96%   BMI  30.52 kg/m    Physical Exam  Constitutional: Very well appearing male in NAD. Awake and alert. WNWD.    Psych: Affect appropriate to situation. Eyes: No scleral injection. HENT: No OP obstruction. Head: Normocephalic/Atraumatic.   Cardiovascular: Normal rate and regular rhythm on telemetry.  No LE edema.  Respiratory: Effort normal.  GI: Soft.  No distension. There is no tenderness.  Skin: WDI.  Neuro: Mental Status: Patient is awake, alert, oriented to person, place, month, year, and situation. Patient is able to give a clear and coherent history. No signs of neglect. He continues to have mild dysarthria at times but is not exhibiting aphasia at this time.  Cranial Nerves: II: Visual Fields are full. Pupils are equal, round, and reactive to light.  III,IV, VI: EOMI without ptosis or diplopia.  V: Facial sensation is symmetric to light touch.  VII: Facial movement is symmetric. No droop.  VIII: hearing is intact to voice. X: Uvula is midline and palate elevates symmetrically. XI: Shoulder shrug is symmetric. XII: tongue is midline without atrophy or fasciculations.  Motor: 5/5 strength throughout.  Tone is normal. Bulk is normal.  Sensory: Sensation is symmetric to light touch in the UE/LEs. Extinction is absent to DSS.  Deep Tendon Reflexes: 2+ and symmetric in the biceps and patellae.  Plantars: Toes are downgoing bilaterally. Cerebellar: FNF and HKS are intact bilaterally. No drift.  Gait:  Deferred.   I have reviewed labs in epic and the pertinent results are: INR 1.0    aPTT  24     creat  1.27   glucose  91    MD reviewed images:  CTH No acute CT finding. Mild chronic small-vessel ischemic change of the hemispheric white matter. Old small left frontal cortical infarction. ASPECTS is 10.  CTA head and neck Normal CT angiography of the head and neck. No large vessel occlusion. No proximal stenosis. No carotid bifurcation disease.   Assessment: 60 yo male  with stroke risk factors of prior TIA, HTN, and HLD. He says his speech is about 10% better, but wife says 30%. He report he talks a lot so he is particular about his speech. His receptive/expressive aphasia seems resolved. His exam here on admission is improved after TNK. He is admitted to ICU post TNK and stroke symptoms. His BP is within post TNK parameters without Cleviprex.   Impression:  -aphasia, resolved.  -dysarthria, improved.  -stroke.   Plan:  Post TNK admission plan -Neurology admit to ICU s/p TNK administration. -BP and NIHSS q15 min  x 2 hr, then q30 mins x 2 hours, then q1hr x 16 hrs.  -BP goal < 180/105. -BP management with Cleviprex prn.  -MRI brain 24 hours after TNK administration. Ativan 1mg  IV on call to MRI for claustrophobia. -Stat CTH for any neurological change. Told patient to call RN for any n/v or HA.  -Diet heart healthy. Has passed swallow evaluation.  -No antiplatelet or AC including VTE prophylaxis for 24 hours.  -SCDs -lipid profile in a.m. Goal LDL < 70. High intensity statin if over goal.  -HbA1c. Goal < 7% -Telemetry to monitor for arrhythmia.  -Euglycemia. -Avoid hyperthermia. Use Acetaminophen prn.  -No punctures or tube insertion x 24 hours post TNK.  -Bleeding precautions.  -PT/OT/ST.  -Stroke team to follow.    Dysarthria. -ST  Stroke.  -PT/OT  CV: -BP control  < 180/105.  -Cleviprex prn.  -Echocardiogram with bubble study. Possible PFO per wife noted at prior cardiology w/u in 2007.  GI/GU -Gentle IV hydration.  -Senna s I po bid. Hold for loose stools.   DVT prophylaxis: SCDs.  GI prophylaxis: Protonix. Senna.   Code Status: Full Code. Patient seen by Clance Boll, MSN, APN-BC and later by MD. Note/plan to be edited by MD.     Attending Neurohospitalist Addendum Patient seen and examined with APP/Resident. Agree with the history and physical as documented above. Agree with the plan as documented, which I helped  formulate. I have independently reviewed the chart, obtained history, review of systems and examined the patient.I have personally reviewed pertinent head/neck/spine imaging (CT/MRI). Please feel free to call with any questions.  -- Amie Portland, MD Neurologist [facility] Pager: [phone]  CRITICAL CARE ATTESTATION Performed by: Amie Portland, MD Total critical care time: 33 minutes Critical care time was exclusive of separately billable procedures and treating other patients and/or supervising APPs/Residents/Students Critical care was necessary to treat or prevent imminent or life-threatening deterioration due to AIS  This patient is critically ill and at significant risk for neurological worsening and/or death and care requires constant monitoring. Critical care was time spent personally by me on the following activities: development of treatment plan with patient and/or surrogate as well as nursing, discussions with consultants, evaluation of patient's response to treatment, examination of patient, obtaining history from patient or surrogate, ordering and performing treatments and interventions, ordering and review of laboratory studies, ordering and review of radiographic studies, pulse oximetry, re-evaluation of patient's condition, participation in multidisciplinary rounds and medical decision making of high complexity in the care of this patient.

## 2021-10-26 NOTE — Progress Notes (Signed)
Spoke with Stroke MD; agreed to re-schedule MRI to afternoon of 11/3 due to TNK administration.

## 2021-10-26 NOTE — ED Notes (Addendum)
Report given to Nicole  @[facility] ICU

## 2021-10-26 NOTE — ED Notes (Signed)
Report given to [facility], they are present to transport to [facility]

## 2021-10-26 NOTE — ED Notes (Signed)
After administration on TNK, no changes noted to speech.  Pt having more aphasia and is frustrated that he can not get out his words correctly.

## 2021-10-26 NOTE — ED Provider Notes (Signed)
I was asked to see patient in triage for possible strokelike symptoms.  When I assessed the patient, he does have a right facial droop that spares the forehead and has some expressive aphasia.  Otherwise strength and sensation were normal in the arms and legs.  Normal finger-nose-finger testing.  Pupils are symmetric and reactive and he denies any vision changes.  He reports his symptoms may start to be improving but are still not normal.  He reports it started at 1 PM.  Decision made to activate a code stroke.  He will be taken to an exam room into the CT scanner.  Care will be assumed by Dr. Silverio Lay for full evaluation and management and work-up.   Ayren Zumbro, Canary Brim, MD 10/26/21 [phone]

## 2021-10-26 NOTE — ED Triage Notes (Signed)
Pt arrives with wife who reports husband was in Zoom call today when he started to experience slurred speech at 1 pm today. They both report symptoms have gotten slightly better but he states he still feels that he is speaking slow. Pt reports he had  a TIA in 2006. Pt also having right sided facial droop, denies numbness or tingling, ambulatory to room. RT reports headache when symptoms started that has also gotten better per patient. Wife also reports patient told her he was having CP, EKG being collected by tech in Triage.   ED Provider in triage evaluating patient, triage delayed.

## 2021-10-26 NOTE — ED Notes (Signed)
Patient returned from CT and CTA code stroke cart at bedside speaking with Misty Stanley RN on cart at this time.

## 2021-10-26 NOTE — ED Notes (Signed)
Patient transported to CT 

## 2021-10-26 NOTE — ED Provider Notes (Addendum)
[facility] EMERGENCY DEPARTMENT Provider Note   CSN: [id number] Arrival date & time: 10/26/21  1448     History Chief Complaint  Patient presents with   Aphasia    Scott Brandt is a 60 y.o. male hx of asthma, HL, TIA, here presenting with aphasia.  Patient has been on the Zoom call all day.  He did take a break around noon time.  He was doing some errands and around 1 PM and got back on the call.  He was noted to have right facial droop and slurred speech. Code stroke was activated in triage.  Patient has a history of TIA. Patient is not on any blood thinners currently  The history is provided by the patient.      Past Medical History:  Diagnosis Date   Asthma    signs of asthma from Dr Sherene Sires    Asthma    Essential hypertension 02/24/2020   Hypercholesteremia    Left ventricular hypertrophy 02/24/2020   Lightheadedness 12/15/2020   TIA (transient ischemic attack) 2007   Urticaria     Patient Active Problem List   Diagnosis Date Noted   Lightheadedness 12/15/2020   Essential hypertension 02/24/2020   Left ventricular hypertrophy 02/24/2020   Oral allergy syndrome 12/13/2016   Closed fracture of left femur, initial encounter (HCC) 02/29/2016   Perennial and seasonal allergic rhinitis 12/08/2015   Allergy with anaphylaxis due to food 12/08/2015   Mild persistent asthma in adult without complication 12/02/2015   Pure hypercholesterolemia 05/29/2010    Past Surgical History:  Procedure Laterality Date   FEMUR IM NAIL Left 02/28/2016   Procedure: INTRAMEDULLARY (IM) NAIL FEMORAL;  Surgeon: Durene Romans, MD;  Location: [facility] OR;  Service: Orthopedics;  Laterality: Left;       Family History  Problem Relation Age of Onset   Allergic rhinitis Father    Hyperlipidemia Mother    Hypertension Mother    Hypertension Sister    Diabetes Sister    Hypertension Brother    Hyperlipidemia Brother    Rheum arthritis Sister    Cancer Sister    Stroke Maternal Grandmother     Angioedema Neg Hx    Asthma Neg Hx    Atopy Neg Hx    Eczema Neg Hx    Immunodeficiency Neg Hx    Urticaria Neg Hx     Social History   Tobacco Use   Smoking status: Never   Smokeless tobacco: Never  Substance Use Topics   Alcohol use: Yes    Alcohol/week: 0.0 standard drinks    Comment: social   Drug use: No    Home Medications Prior to Admission medications   Medication Sig Start Date End Date Taking? Authorizing Provider  ALPRAZolam Prudy Feeler) 0.5 MG tablet Take 0.5 mg by mouth at bedtime as needed for anxiety.    [provider]  amLODipine (NORVASC) 10 MG tablet TAKE 1 TABLET DAILY 07/12/21   Chilton Si, MD  aspirin 81 MG tablet Take 81 mg by mouth every other day.     [provider]  atorvastatin (LIPITOR) 20 MG tablet Take 1 tablet (20 mg total) every other day by mouth. 11/12/17   Chilton Si, MD  brimonidine (ALPHAGAN) 0.2 % ophthalmic solution brimonidine 0.2 % eye drops  INSTILL 1 DROP INTO BOTH EYES TWICE A DAY    [provider]  Cholecalciferol (D3 ADULT PO) Take by mouth.    [provider]  fexofenadine (ALLEGRA) 180 MG tablet  Take 180 mg by mouth as needed.     [provider]  fluocinonide cream (LIDEX) 0.05 % APPLY 1 A SMALL AMOUNT APPLY ON THE SKIN TWICE A DAY AS NEEDED 12/28/19   [provider]  fluticasone (FLONASE) 50 MCG/ACT nasal spray Place into both nostrils as needed.    [provider]  latanoprost (XALATAN) 0.005 % ophthalmic solution latanoprost 0.005 % eye drops  INSTILL 1 DROP INTO BOTH EYES AT BEDTIME    [provider]    Allergies    Shellfish allergy  Review of Systems   Review of Systems  Neurological:  Positive for speech difficulty.  All other systems reviewed and are negative.  Physical Exam Updated Vital Signs BP (!) 162/100   Pulse 64   Temp 98.5 F (36.9 C) (Oral)   Resp 11   Ht 6' (1.829 m)   Wt 102.1 kg   SpO2 100%   BMI 30.52 kg/m    Physical Exam Vitals and nursing note reviewed.  Constitutional:      Appearance: Normal appearance.  HENT:     Head: Normocephalic.     Nose: Nose normal.     Mouth/Throat:     Mouth: Mucous membranes are moist.  Eyes:     Extraocular Movements: Extraocular movements intact.     Pupils: Pupils are equal, round, and reactive to light.  Cardiovascular:     Rate and Rhythm: Normal rate and regular rhythm.     Pulses: Normal pulses.     Heart sounds: Normal heart sounds.  Pulmonary:     Effort: Pulmonary effort is normal.     Breath sounds: Normal breath sounds.  Abdominal:     General: Abdomen is flat.     Palpations: Abdomen is soft.  Musculoskeletal:        General: Normal range of motion.     Cervical back: Normal range of motion and neck supple.  Skin:    General: Skin is warm.     Capillary Refill: Capillary refill takes less than 2 seconds.  Neurological:     Mental Status: He is alert.     Comments: Right facial droop.  Patient does have some excessive aphasia.  Patient has normal strength and sensation bilateral arms and legs.  No obvious visual field cut  Psychiatric:        Mood and Affect: Mood normal.        Behavior: Behavior normal.    ED Results / Procedures / Treatments   Labs (all labs ordered are listed, but only abnormal results are displayed) Labs Reviewed  CBG MONITORING, ED - Abnormal; Notable for the following components:      Result Value   Glucose-Capillary 121 (*)    All other components within normal limits  PROTIME-INR  APTT  CBC  DIFFERENTIAL  COMPREHENSIVE METABOLIC PANEL    EKG None  Radiology CT HEAD CODE STROKE WO CONTRAST  Result Date: 10/26/2021 CLINICAL DATA:  Code stroke. Neuro deficit, acute, stroke suspected. Slurred speech. Right facial droop. Symptoms began 13 hours. EXAM: CT HEAD WITHOUT CONTRAST TECHNIQUE: Contiguous axial images were obtained from the base of the skull through the vertex without intravenous  contrast. COMPARISON:  None. FINDINGS: Brain: No sign of acute infarction, mass lesion, hemorrhage, hydrocephalus or extra-axial collection. Mild chronic small-vessel ischemic change of the hemispheric white matter. Old small left frontal cortical and subcortical infarction. Vascular: There is atherosclerotic calcification of the major vessels at the base of the brain.  Skull: Negative Sinuses/Orbits: Clear/normal Other: None ASPECTS (Alberta Stroke Program Early CT Score) - Ganglionic level infarction (caudate, lentiform nuclei, internal capsule, insula, M1-M3 cortex): 7 - Supraganglionic infarction (M4-M6 cortex): 3 Total score (0-10 with 10 being normal): 10 IMPRESSION: 1. No acute CT finding. Mild chronic small-vessel ischemic change of the hemispheric white matter. Old small left frontal cortical infarction. 2. ASPECTS is 10. 3. These results were communicated to Dr. Otelia Limes at 3:15 pm on 10/26/2021 by text page via the [facility] messaging system. Electronically Signed   By: Paulina Fusi M.D.   On: 10/26/2021 15:19   CT ANGIO HEAD NECK W WO CM (CODE STROKE)  Result Date: 10/26/2021 CLINICAL DATA:  Right facial droop.  Slurred speech. EXAM: CT ANGIOGRAPHY HEAD AND NECK TECHNIQUE: Multidetector CT imaging of the head and neck was performed using the standard protocol during bolus administration of intravenous contrast. Multiplanar CT image reconstructions and MIPs were obtained to evaluate the vascular anatomy. Carotid stenosis measurements (when applicable) are obtained utilizing NASCET criteria, using the distal internal carotid diameter as the denominator. CONTRAST:  OMNIPAQUE IOHEXOL 350 MG/ML SOLN COMPARISON:  Head CT earlier same day FINDINGS: CTA NECK FINDINGS Aortic arch: Aortic atherosclerosis.  Branching pattern is normal. Right carotid system: Common carotid artery widely patent to the bifurcation. Carotid bifurcation is normal. Cervical ICA is normal. Left carotid system: Common carotid artery  widely patent to the bifurcation. Carotid bifurcation is normal. Cervical ICA is normal. Vertebral arteries: Both vertebral artery origins are widely patent. Both vertebral arteries are patent through the cervical region to the foramen magnum. Skeleton: Ordinary spondylosis. Other neck: No mass or lymphadenopathy. Upper chest: Lung apices are clear. Review of the MIP images confirms the above findings CTA HEAD FINDINGS Anterior circulation: Both internal carotid arteries widely patent through the skull base and siphon regions. Minimal siphon atherosclerotic calcification but no stenosis. The anterior and middle cerebral vessels are patent. No large vessel occlusion. No proximal stenosis. No aneurysm or vascular malformation. Posterior circulation: Both vertebral arteries are patent to the basilar. No basilar stenosis. Posterior circulation branch vessels are normal. Patent posterior communicating arteries. Venous sinuses: Patent and normal. Anatomic variants: None significant. Review of the MIP images confirms the above findings IMPRESSION: Normal CT angiography of the head and neck. No large vessel occlusion. No proximal stenosis. No carotid bifurcation disease. Electronically Signed   By: Paulina Fusi M.D.   On: 10/26/2021 15:40    Procedures Procedures   CRITICAL CARE Performed by: Richardean Canal   Total critical care time: 30 minutes  Critical care time was exclusive of separately billable procedures and treating other patients.  Critical care was necessary to treat or prevent imminent or life-threatening deterioration.  Critical care was time spent personally by me on the following activities: development of treatment plan with patient and/or surrogate as well as nursing, discussions with consultants, evaluation of patient's response to treatment, examination of patient, obtaining history from patient or surrogate, ordering and performing treatments and interventions, ordering and review of  laboratory studies, ordering and review of radiographic studies, pulse oximetry and re-evaluation of patient's condition.   Medications Ordered in ED Medications  sodium chloride flush (NS) 0.9 % injection 3 mL (has no administration in time range)  iohexol (OMNIPAQUE) 350 MG/ML injection 100 mL (100 mLs Intravenous Contrast Given 10/26/21 1524)    ED Course  I have reviewed the triage vital signs and the nursing notes.  Pertinent labs & imaging results that were available during  my care of the patient were reviewed by me and considered in my medical decision making (see chart for details).    MDM Rules/Calculators/A&P                           LAVIN PETTEWAY is a 60 y.o. male here presenting with possible code stroke.  Patient's last normal was around 12 PM when he signed off of Zoom call.  When he signed back on that 1 PM he was noted to have facial droop and slurred speech.  His symptoms are improving already.  Code stroke activated in triage.  Consulted telemetry neurology, Dr. Otelia Limes.   4:27 PM Dr. Otelia Limes saw patient and ordered TNK as patient still has R facial droop. Dr. Otelia Limes to talk to Dr. Wilford Corner at [facility]. I talked to Dr. Renaye Rakers in [facility] ED, who will be accepting doctor. Anticipate that patient will need to go to neuro ICU.    4:50 PM I was able to get patient near ICU bed under Dr. Wilford Corner. Patient will be transferred directly to the ICU   Final Clinical Impression(s) / ED Diagnoses Final diagnoses:  None    Rx / DC Orders ED Discharge Orders     None        Charlynne Pander, MD 10/26/21 1629    Charlynne Pander, MD 10/26/21 1651

## 2021-10-27 ENCOUNTER — Inpatient Hospital Stay (HOSPITAL_COMMUNITY): Payer: BC Managed Care – PPO

## 2021-10-27 DIAGNOSIS — Z8673 Personal history of transient ischemic attack (TIA), and cerebral infarction without residual deficits: Secondary | ICD-10-CM

## 2021-10-27 DIAGNOSIS — Q2112 Patent foramen ovale: Secondary | ICD-10-CM

## 2021-10-27 DIAGNOSIS — I63412 Cerebral infarction due to embolism of left middle cerebral artery: Secondary | ICD-10-CM

## 2021-10-27 DIAGNOSIS — I6389 Other cerebral infarction: Secondary | ICD-10-CM

## 2021-10-27 LAB — CBC
HCT: 38.5 % — ABNORMAL LOW (ref 39.0–52.0)
Hemoglobin: 13 g/dL (ref 13.0–17.0)
MCH: 30 pg (ref 26.0–34.0)
MCHC: 33.8 g/dL (ref 30.0–36.0)
MCV: 88.7 fL (ref 80.0–100.0)
Platelets: 271 10*3/uL (ref 150–400)
RBC: 4.34 MIL/uL (ref 4.22–5.81)
RDW: 13.8 % (ref 11.5–15.5)
WBC: 5.1 10*3/uL (ref 4.0–10.5)
nRBC: 0 % (ref 0.0–0.2)

## 2021-10-27 LAB — RAPID URINE DRUG SCREEN, HOSP PERFORMED
Amphetamines: NOT DETECTED
Barbiturates: NOT DETECTED
Benzodiazepines: NOT DETECTED
Cocaine: NOT DETECTED
Opiates: NOT DETECTED
Tetrahydrocannabinol: NOT DETECTED

## 2021-10-27 LAB — BASIC METABOLIC PANEL
Anion gap: 6 (ref 5–15)
BUN: 8 mg/dL (ref 6–20)
CO2: 26 mmol/L (ref 22–32)
Calcium: 8.7 mg/dL — ABNORMAL LOW (ref 8.9–10.3)
Chloride: 106 mmol/L (ref 98–111)
Creatinine, Ser: 1.13 mg/dL (ref 0.61–1.24)
GFR, Estimated: >60 mL/min (ref >60)
Glucose, Bld: 87 mg/dL (ref 70–99)
Potassium: 3.9 mmol/L (ref 3.5–5.1)
Sodium: 138 mmol/L (ref 135–145)

## 2021-10-27 LAB — LIPID PANEL
Cholesterol: 120 mg/dL (ref 0–200)
HDL: 52 mg/dL (ref >40)
LDL Cholesterol: 60 mg/dL (ref 0–99)
Total CHOL/HDL Ratio: 2.3 RATIO
Triglycerides: 38 mg/dL (ref <150)
VLDL: 8 mg/dL (ref 0–40)

## 2021-10-27 LAB — HEMOGLOBIN A1C
Hgb A1c MFr Bld: 6.2 % — ABNORMAL HIGH (ref 4.8–5.6)
Mean Plasma Glucose: 131.24 mg/dL

## 2021-10-27 LAB — ECHOCARDIOGRAM COMPLETE BUBBLE STUDY
AR max vel: 2.7 cm2
AV Peak grad: 6 mmHg
Ao pk vel: 1.22 m/s
Area-P 1/2: 4.63 cm2
Calc EF: 57.3 %
S' Lateral: 2.3 cm
Single Plane A2C EF: 61 %
Single Plane A4C EF: 56.6 %

## 2021-10-27 LAB — HIV ANTIBODY (ROUTINE TESTING W REFLEX): HIV Screen 4th Generation wRfx: NONREACTIVE

## 2021-10-27 LAB — VITAMIN B12: Vitamin B-12: 270 pg/mL (ref 180–914)

## 2021-10-27 LAB — TSH: TSH: 3.457 u[IU]/mL (ref 0.350–4.500)

## 2021-10-27 MED ORDER — ALPRAZOLAM 0.5 MG PO TABS
0.5000 mg | ORAL_TABLET | Freq: Every evening | ORAL | Status: DC | PRN
  Filled 2021-10-27: qty 1

## 2021-10-27 MED ORDER — LORAZEPAM 2 MG/ML IJ SOLN
2.0000 mg | Freq: Once | INTRAMUSCULAR | Status: AC
  Administered 2021-10-27: 2 mg via INTRAVENOUS
  Filled 2021-10-27: qty 1

## 2021-10-27 MED ORDER — LATANOPROST 0.005 % OP SOLN
1.0000 [drp] | Freq: Every day | OPHTHALMIC | Status: DC
  Filled 2021-10-27: qty 2.5

## 2021-10-27 MED ORDER — ATORVASTATIN CALCIUM 10 MG PO TABS
20.0000 mg | ORAL_TABLET | Freq: Every day | ORAL | Status: DC
  Administered 2021-10-27 – 2021-10-28 (×2): 20 mg via ORAL
  Filled 2021-10-27 (×2): qty 2

## 2021-10-27 MED ORDER — BRIMONIDINE TARTRATE 0.2 % OP SOLN
1.0000 [drp] | Freq: Two times a day (BID) | OPHTHALMIC | Status: DC
  Administered 2021-10-27 (×2): 1 [drp] via OPHTHALMIC
  Filled 2021-10-27: qty 5

## 2021-10-27 MED ORDER — CLOPIDOGREL BISULFATE 75 MG PO TABS
75.0000 mg | ORAL_TABLET | Freq: Every day | ORAL | Status: DC
  Administered 2021-10-27 – 2021-10-28 (×2): 75 mg via ORAL
  Filled 2021-10-27 (×2): qty 1

## 2021-10-27 MED ORDER — ASPIRIN EC 81 MG PO TBEC
81.0000 mg | DELAYED_RELEASE_TABLET | Freq: Every day | ORAL | Status: DC
  Administered 2021-10-27 – 2021-10-28 (×2): 81 mg via ORAL
  Filled 2021-10-27 (×2): qty 1

## 2021-10-27 MED ORDER — PANTOPRAZOLE SODIUM 40 MG PO TBEC
40.0000 mg | DELAYED_RELEASE_TABLET | Freq: Every day | ORAL | Status: DC
  Administered 2021-10-27 – 2021-10-28 (×2): 40 mg via ORAL
  Filled 2021-10-27 (×2): qty 1

## 2021-10-27 NOTE — Progress Notes (Signed)
OT Cancellation Note  Patient Details Name: BRODRICK CURRAN MRN: [id number] DOB: 1961/11/23   Cancelled Treatment:    Reason Eval/Treat Not Completed: Active bedrest order. Will assess when medically appropriate and activity orders updated.   Thornell Mule, OT/L   Acute OT Clinical Specialist Acute Rehabilitation Services Pager [phone] Office [phone]  10/27/2021, 10:18 AM

## 2021-10-27 NOTE — Evaluation (Signed)
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   Closed fracture of left femur, initial encounter (HCC) 02/29/2016   Perennial and seasonal allergic rhinitis 12/08/2015   Allergy with anaphylaxis due to food 12/08/2015   Mild persistent asthma in adult without complication 12/02/2015   Pure hypercholesterolemia 05/29/2010   Past Medical History:  Past Medical History:  Diagnosis Date   Asthma    signs of asthma from Dr Sherene Sires    Asthma    Essential hypertension 02/24/2020   Hypercholesteremia    Left ventricular hypertrophy 02/24/2020   Lightheadedness 12/15/2020   TIA (transient ischemic attack) 2007   Urticaria    Past Surgical History:  Past Surgical History:  Procedure Laterality Date   FEMUR IM NAIL Left 02/28/2016   Procedure: INTRAMEDULLARY (IM) NAIL FEMORAL;  Surgeon: Durene Romans, MD;  Location: [facility] OR;  Service: Orthopedics;  Laterality: Left;   HPI:  60 y.o. male with history of stroke, asthma, HTN and HLD presenting with aphasia and right sided facial droop s/p TNK.   CT of head without acute intracranial abnormality, pending MRI of brain.   Assessment / Plan / Recommendation Clinical Impression  Pt presents with grossly intact cognitive abilities and states language abilities (word finding specifically) are about 85 percent back to baseline. [facility] Mental Status was administered: 27/30 which is consistent with non impaired/functional cognitive abilities based upon pts educational hx of master's degree. Deficits noted with mental manipulation  1/3 and delayed recall 4/5. PLOF pt works full time in [employer] with supportive family. He notes communication being an important part of day to day work. During clinical interaction with SLP, pt with only one instance of anomic aphasia. Speech remained largely fluent. Motor speech skills were intact. Oral motor exam was unremarkable; family reports pt right facial droop no longer noted. Will follow up to ensure anomic aphasia abilities are back to baseline.    SLP Assessment  SLP Recommendation/Assessment: Patient needs continued Speech Lanaguage Pathology Services SLP Visit Diagnosis: Aphasia (R47.01)    Recommendations for follow up therapy are one component of a multi-disciplinary discharge planning process, led by the attending physician.  Recommendations may be updated based on patient status, additional functional criteria and insurance authorization.    Follow Up Recommendations  Other (comment) (TBD)    Frequency and Duration min 1 x/week  1 week      SLP Evaluation Cognition  Overall Cognitive Status: Within Functional Limits for tasks assessed Arousal/Alertness: Awake/alert Orientation Level: Oriented X4 Awareness: Appears intact Problem Solving: Appears intact Executive Function: Reasoning Safety/Judgment: Appears intact       Comprehension  Auditory Comprehension Overall Auditory Comprehension: Appears within functional limits for tasks assessed Visual Recognition/Discrimination Discrimination: Within Function Limits Reading Comprehension Reading Status: Within funtional limits    Expression Expression Primary Mode of Expression: Verbal Verbal Expression Overall Verbal Expression: Other (comment) (minimal anomia of speech; pt feels abilities are 85 percent back to baseline) Initiation: No impairment Pragmatics: No impairment Written Expression Dominant Hand: Right Written Expression: Within Functional Limits   Oral / Motor  Oral Motor/Sensory Function Overall  Oral Motor/Sensory Function: Within  functional limits Motor Speech Overall Motor Speech: Appears within functional limits for tasks assessed   GO                    Ardyth Gal MA, CCC-SLP Acute Rehabilitation Services   10/27/2021, 2:54 PM

## 2021-10-27 NOTE — Evaluation (Signed)
Physical Therapy Evaluation Patient Details Name: Scott Brandt MRN: [id number] DOB: [birth year] Today's Date: 10/27/2021  History of Present Illness  60 yo male presents to ED on 11/2 with slurred speech, headache, R facial droop. CTH shows chronic L frontal lobe CVA (given acute change does not fully account for deficits), CTA head/neck shows no acute findings. TNK administered 11/2. PMH includes asthma, HTN, TIA, L femur IMN 2017.  Clinical Impression   Pt presents with [facility] strength, slightly decreased balance and activity tolerance vs baseline. Pt also notes he is still having some expressive difficulty, difficult for PT to discern. Pt to benefit from acute PT to address deficits. Pt ambulated great hallway distance, PT to see pt acutely for higher level balance, activity progression, and stair training. PT to progress mobility as tolerated, and will continue to follow acutely.         Recommendations for follow up therapy are one component of a multi-disciplinary discharge planning process, led by the attending physician.  Recommendations may be updated based on patient status, additional functional criteria and insurance authorization.  Follow Up Recommendations No PT follow up    Assistance Recommended at Discharge PRN  Functional Status Assessment Patient has had a recent decline in their functional status and demonstrates the ability to make significant improvements in function in a reasonable and predictable amount of time.  Equipment Recommendations  None recommended by PT    Recommendations for Other Services       Precautions / Restrictions Precautions Precautions: None Restrictions Weight Bearing Restrictions: No      Mobility  Bed Mobility Overal bed mobility: Modified Independent                  Transfers Overall transfer level: Needs assistance Equipment used: None Transfers: Sit to/from Stand Sit to Stand: Supervision           General transfer  comment: for safety only    Ambulation/Gait Ambulation/Gait assistance: Supervision Gait Distance (Feet): 650 Feet Assistive device: None Gait Pattern/deviations: Step-through pattern;WFL(Within Functional Limits)     General Gait Details: WFL gait, slightly increased time  Stairs            Wheelchair Mobility    Modified Rankin (Stroke Patients Only) Modified Rankin (Stroke Patients Only) Pre-Morbid Rankin Score: No symptoms Modified Rankin: No significant disability     Balance Overall balance assessment: Needs assistance Sitting-balance support: No upper extremity supported Sitting balance-Leahy Scale: Good       Standing balance-Leahy Scale: Good                               Pertinent Vitals/Pain Pain Assessment: No/denies pain    Home Living Family/patient expects to be discharged to:: Private residence Living Arrangements: Spouse/significant other Available Help at Discharge: Family Type of Home: House Home Access: Stairs to enter   Secretary/administrator of Steps: 3   Home Layout: Able to live on main level with bedroom/bathroom Home Equipment: None      Prior Function Prior Level of Function : Independent/Modified Independent             Mobility Comments: pt reports independence with mobility, walks x100 miles/month ADLs Comments: Pt works in Lobby[employer]   Dominant Hand: Right    Extremity/Trunk Assessment   Upper Extremity Assessment Upper Extremity Assessment: Defer to OT evaluation    Lower Extremity Assessment Lower Extremity Assessment:  Overall [facility] for tasks assessed    Cervical / Trunk Assessment Cervical / Trunk Assessment: Normal  Communication   Communication: No difficulties  Cognition Arousal/Alertness: Awake/alert Behavior During Therapy: WFL for tasks assessed/performed Overall Cognitive Status: Within Functional Limits for tasks assessed                                           General Comments General comments (skin integrity, edema, etc.): vss    Exercises     Assessment/Plan    PT Assessment Patient needs continued PT services  PT Problem List Decreased mobility;Decreased activity tolerance;Decreased balance;Decreased knowledge of precautions       PT Treatment Interventions Therapeutic activities;Functional mobility training;Balance training;Patient/family education    PT Goals (Current goals can be found in the Care Plan section)  Acute Rehab PT Goals Patient Stated Goal: home PT Goal Formulation: With patient Time For Goal Achievement: 11/10/21 Potential to Achieve Goals: Good    Frequency Min 4X/week   Barriers to discharge        Co-evaluation               AM-PAC PT "6 Clicks" Mobility  Outcome Measure Help needed turning from your back to your side while in a flat bed without using bedrails?: None Help needed moving from lying on your back to sitting on the side of a flat bed without using bedrails?: None Help needed moving to and from a bed to a chair (including a wheelchair)?: None Help needed standing up from a chair using your arms (e.g., wheelchair or bedside chair)?: None Help needed to walk in hospital room?: None Help needed climbing 3-5 steps with a railing? : A Little 6 Click Score: 23    End of Session   Activity Tolerance: Patient tolerated treatment well Patient left: in bed;with call bell/phone within reach;with family/visitor present Nurse Communication: Mobility status PT Visit Diagnosis: Other abnormalities of gait and mobility (R26.89)    Time: 5852-7782 PT Time Calculation (min) (ACUTE ONLY): 17 min   Charges:   PT Evaluation $PT Eval Low Complexity: 1 Low        Afra Tricarico S, PT DPT Acute Rehabilitation Services Pager [phone]  Office [phone]   Tyrone Apple E Christain [place] 10/27/2021, 5:21 PM

## 2021-10-27 NOTE — Progress Notes (Signed)
Pt brought down to MRI for exam. Upon entering scan room pt refused MRI. Already given meds prior to exam. [place] sent to ordering NP.

## 2021-10-27 NOTE — Progress Notes (Signed)
    [facility] has been requested to perform a transesophageal echocardiogram on Mr. Riegler for stroke.  After careful review of history and examination, the risks and benefits of transesophageal echocardiogram have been explained including risks of esophageal damage, perforation (1:10,000 risk), bleeding, pharyngeal hematoma as well as other potential complications associated with conscious sedation including aspiration, arrhythmia, respiratory failure and death. Alternatives to treatment were discussed, questions were answered. Patient is willing to proceed.  TEE - Dr. Duke Salvia @ 1:30pm. NPO after midnight. Meds with sips.   Manson Passey, PA-C 10/27/2021 1:38 PM

## 2021-10-27 NOTE — Progress Notes (Signed)
PT Cancellation Note  Patient Details Name: ROMULUS HANRAHAN MRN: [id number] DOB: September 26, 1961   Cancelled Treatment:    Reason Eval/Treat Not Completed: Active bedrest order - will check back as medically appropriate.   Marye Round, PT DPT Acute Rehabilitation Services Pager [phone]  Office [phone]    Truddie Coco 10/27/2021, 12:18 PM

## 2021-10-27 NOTE — H&P (View-Only) (Signed)
    [facility] has been requested to perform a transesophageal echocardiogram on Mr. Riegler for stroke.  After careful review of history and examination, the risks and benefits of transesophageal echocardiogram have been explained including risks of esophageal damage, perforation (1:10,000 risk), bleeding, pharyngeal hematoma as well as other potential complications associated with conscious sedation including aspiration, arrhythmia, respiratory failure and death. Alternatives to treatment were discussed, questions were answered. Patient is willing to proceed.  TEE - Dr. Duke Salvia @ 1:30pm. NPO after midnight. Meds with sips.   Manson Passey, PA-C 10/27/2021 1:38 PM

## 2021-10-27 NOTE — TOC CAGE-AID Note (Signed)
Transition of Care [facility]) - CAGE-AID Screening   Patient Details  Name: Scott Brandt MRN: [id number] Date of Birth: [birth year]  Transition of [facility]) CM/SW Contact:    Chisom Aust C Tarpley-Carter, LCSWA Phone Number: 10/27/2021, 8:26 AM   Clinical Narrative: Pt is unable to participate in Cage Aid.  Pt is currently in critical condition.  Bernita Beckstrom Tarpley-Carter, MSW, LCSW-A Pronouns:  She/Her/Hers [facility] Clinical Social Worker Direct Number:  [phone] [email or web address]  CAGE-AID Screening: Substance Abuse Screening unable to be completed due to: : Patient unable to participate             Substance Abuse Education Offered: No

## 2021-10-27 NOTE — Progress Notes (Addendum)
STROKE TEAM PROGRESS NOTE   INTERVAL HISTORY Patient is seen in room with no family at the bedside.  He is hemodynamically stable and in no acute distress.  He reports that yesterday, he felt a strange sensation on the right side of his face.  He tried to eat some grapes and noticed that one of them fell out of his mouth.  At this point, he attempted to speak to his wife but wasn't able to speak normally.  He was then taken to an outside hospital in [facility], given TNK and transferred here.  Patient states that at this time, he can detect no residual deficits and that his speech is back to baseline.  Vitals:   10/27/21 0800 10/27/21 0900 10/27/21 1000 10/27/21 1100  BP: (!) 138/93 (!) 144/102 (!) 146/103 116/85  Pulse: (!) 57 (!) 53 (!) 57 (!) 59  Resp: 17 15 18 19   Temp: 98.7 F (37.1 C)     TempSrc: Axillary     SpO2: 93% 96% 98% 93%  Weight:      Height:       CBC:  Recent Labs  Lab 10/26/21 1545 10/27/21 0830  WBC 5.2 5.1  NEUTROABS 2.4  --   HGB 13.5 13.0  HCT 40.9 38.5*  MCV 90.1 88.7  PLT 286 271   Basic Metabolic Panel:  Recent Labs  Lab 10/26/21 1545 10/27/21 0830  NA 138 138  K 3.7 3.9  CL 103 106  CO2 26 26  GLUCOSE 91 87  BUN 11 8  CREATININE 1.27* 1.13  CALCIUM 9.0 8.7*   Lipid Panel:  Recent Labs  Lab 10/27/21 0342  CHOL 120  TRIG 38  HDL 52  CHOLHDL 2.3  VLDL 8  LDLCALC 60   HgbA1c:  Recent Labs  Lab 10/27/21 0342  HGBA1C 6.2*   Urine Drug Screen:  Recent Labs  Lab 10/27/21 0800  LABOPIA NONE DETECTED  COCAINSCRNUR NONE DETECTED  LABBENZ NONE DETECTED  AMPHETMU NONE DETECTED  THCU NONE DETECTED  LABBARB NONE DETECTED    Alcohol Level No results for input(s): ETH in the last 168 hours.  IMAGING past 24 hours CT HEAD CODE STROKE WO CONTRAST  Result Date: 10/26/2021 CLINICAL DATA:  Code stroke. Neuro deficit, acute, stroke suspected. Slurred speech. Right facial droop. Symptoms began 13 hours. EXAM: CT HEAD WITHOUT CONTRAST  TECHNIQUE: Contiguous axial images were obtained from the base of the skull through the vertex without intravenous contrast. COMPARISON:  None. FINDINGS: Brain: No sign of acute infarction, mass lesion, hemorrhage, hydrocephalus or extra-axial collection. Mild chronic small-vessel ischemic change of the hemispheric white matter. Old small left frontal cortical and subcortical infarction. Vascular: There is atherosclerotic calcification of the major vessels at the base of the brain. Skull: Negative Sinuses/Orbits: Clear/normal Other: None ASPECTS (Alberta Stroke Program Early CT Score) - Ganglionic level infarction (caudate, lentiform nuclei, internal capsule, insula, M1-M3 cortex): 7 - Supraganglionic infarction (M4-M6 cortex): 3 Total score (0-10 with 10 being normal): 10 IMPRESSION: 1. No acute CT finding. Mild chronic small-vessel ischemic change of the hemispheric white matter. Old small left frontal cortical infarction. 2. ASPECTS is 10. 3. These results were communicated to Dr. 13/01/2021 at 3:15 pm on 10/26/2021 by text page via the [facility] messaging system. Electronically Signed   By: [facility] M.D.   On: 10/26/2021 15:19   CT ANGIO HEAD NECK W WO CM (CODE STROKE)  Result Date: 10/26/2021 CLINICAL DATA:  Right facial droop.  Slurred speech. EXAM:  CT ANGIOGRAPHY HEAD AND NECK TECHNIQUE: Multidetector CT imaging of the head and neck was performed using the standard protocol during bolus administration of intravenous contrast. Multiplanar CT image reconstructions and MIPs were obtained to evaluate the vascular anatomy. Carotid stenosis measurements (when applicable) are obtained utilizing NASCET criteria, using the distal internal carotid diameter as the denominator. CONTRAST:  OMNIPAQUE IOHEXOL 350 MG/ML SOLN COMPARISON:  Head CT earlier same day FINDINGS: CTA NECK FINDINGS Aortic arch: Aortic atherosclerosis.  Branching pattern is normal. Right carotid system: Common carotid artery widely patent to the  bifurcation. Carotid bifurcation is normal. Cervical ICA is normal. Left carotid system: Common carotid artery widely patent to the bifurcation. Carotid bifurcation is normal. Cervical ICA is normal. Vertebral arteries: Both vertebral artery origins are widely patent. Both vertebral arteries are patent through the cervical region to the foramen magnum. Skeleton: Ordinary spondylosis. Other neck: No mass or lymphadenopathy. Upper chest: Lung apices are clear. Review of the MIP images confirms the above findings CTA HEAD FINDINGS Anterior circulation: Both internal carotid arteries widely patent through the skull base and siphon regions. Minimal siphon atherosclerotic calcification but no stenosis. The anterior and middle cerebral vessels are patent. No large vessel occlusion. No proximal stenosis. No aneurysm or vascular malformation. Posterior circulation: Both vertebral arteries are patent to the basilar. No basilar stenosis. Posterior circulation branch vessels are normal. Patent posterior communicating arteries. Venous sinuses: Patent and normal. Anatomic variants: None significant. Review of the MIP images confirms the above findings IMPRESSION: Normal CT angiography of the head and neck. No large vessel occlusion. No proximal stenosis. No carotid bifurcation disease. Electronically Signed   By: Paulina Fusi M.D.   On: 10/26/2021 15:40    PHYSICAL EXAM General: Patient is an alert, well-nourished male in no acute distress.   NEURO:  Mental Status: AA&Ox3  Speech/Language: speech is without dysarthria or aphasia.  Naming, repetition, fluency, and comprehension intact.  Cranial Nerves:  II: PERRL. Visual fields full.  III, IV, VI: EOMI. Eyelids elevate symmetrically.  V: Sensation is intact to light touch and symmetrical to face.  VII: Smile is symmetrical. Able to puff cheeks and raise eyebrows.  VIII: hearing intact to voice. IX, X: Palate elevates symmetrically. Phonation is normal.  XII:  tongue is midline without fasciculations. Motor: 5/5 strength to all muscle groups tested.  Tone: is normal and bulk is normal Sensation- Intact to light touch bilaterally.   Coordination: FTN intact bilaterally, HKS: no ataxia in BLE.No drift.  Gait- deferred    ASSESSMENT/PLAN Mr. FLORENCE ANTONELLI is a 60 y.o. male with history of stroke, asthma, HTN and HLD presenting with aphasia and right sided facial droop s/p TNK.  Patient states that he feels no residual deficits now and believes that his speech is back to baseline.  He has a possible history of a PFO, so TEE will be performed to evaluate for this.  Patient will also be evaluated for possible loop recorder placement to evaluate for occult atrial fibrillation.  Stroke:  left frontal infarct secondary to unknown cause, could be related to PFO vs. Occult afib Code Stroke CT head No acute abnormality. Small vessel disease and old small left frontal cortical infarction ASPECTS 10.    CTA head & neck No LVO, stenosis or carotid disease MRI  pending 2D Echo pending LDL 60 HgbA1c 6.2 VTE prophylaxis - SCDs Aspirin 81 mg every other day  prior to admission, now on No antithrombotic. Secondary to within 24h TNK administration Therapy recommendations:  pending Disposition:  pending, likely home  Hypertension Home meds:  Norvasc 10 mg daily Stable BP goal < 180/105 post TNK Long-term BP goal normotensive  Hyperlipidemia Home meds:  Atorvastatin 20 mg daily, resumed in hospital LDL 60, goal < 70 High intensity statin not needed as LDL below goal Continue statin at discharge  History of stroke in 2007 Patient states that he had a stroke in 2007 with similar presenting symptoms (aphasia) but that deficits resolved spontaneously  Workup revealed possible PFO- will perform TEE to evaluate for this   Other Stroke Risk Factors Obesity, Body mass index is 30.52 kg/m., BMI >/= 30 associated with increased stroke risk, recommend weight loss,  diet and exercise as appropriate  Hx stroke/TIA   Other Active Problems Asthma Patient taking no medications for this at home Will monitor for dyspnea and wheezing  Hospital day # 1  Cortney E Ernestina [place], NP   ATTENDING NOTE: I reviewed above note and agree with the assessment and plan. Pt was seen and examined.   60 year old male with history of hypertension, hyperlipidemia, TIA 2007, PFO admitted for right facial droop, aphasia and headache at back of the head.  Currently symptoms resolved.  Patient stated that symptoms could last about 8 hours.  CT no acute abnormality.  Status post TN K.  CTA head and neck unremarkable.  MRI showed left small frontal cortical infarct.  EF 55%.  A1c 6.2, LDL 60.  UDS negative.  Creatinine 1.27.  Hypercoagulable work-up pending.  TEE pending.  Per patient, he had a stroke/TIA in 2007 with a similar presentation with aphasia, he was admitted in [place] for stroke work-up, found to have a PFO but no specific treatment at that time.  On exam, patient neurologically intact, orientated x3, awake alert, no aphasia, follows simple commands.  No focal deficit.  Etiology for patient stroke unclear at this time, likely cardioembolic source due to PFO related or occult A. fib.  We will do TEE to further evaluate PFO and rule out other source of the cardio emboli.  If negative, will consider loop recorder to rule out A. fib.  For aspirin and Plavix DAPT, and Lipitor 20 for stroke prevention.  For detailed assessment and plan, please refer to above as I have made changes wherever appropriate.   Marvel Plan, MD PhD Stroke Neurology 10/27/2021 6:16 PM    To contact Stroke Continuity provider, please refer to [email or web address]. After hours, contact General Neurology

## 2021-10-27 NOTE — Progress Notes (Signed)
Pt brought down to MRI. Unable to even begin scan. PRN Xanax given prior to MRI.

## 2021-10-28 ENCOUNTER — Encounter (HOSPITAL_COMMUNITY)
Admission: EM | Disposition: A | Discharge status: Home / Self Care | Payer: Self-pay | Source: Home / Self Care | Attending: Neurology

## 2021-10-28 ENCOUNTER — Inpatient Hospital Stay (HOSPITAL_COMMUNITY): Payer: BC Managed Care – PPO | Admitting: Anesthesiology

## 2021-10-28 ENCOUNTER — Encounter (HOSPITAL_COMMUNITY): Payer: Self-pay | Admitting: Student in an Organized Health Care Education/Training Program

## 2021-10-28 ENCOUNTER — Inpatient Hospital Stay (HOSPITAL_COMMUNITY): Payer: BC Managed Care – PPO

## 2021-10-28 DIAGNOSIS — I639 Cerebral infarction, unspecified: Secondary | ICD-10-CM

## 2021-10-28 DIAGNOSIS — E7800 Pure hypercholesterolemia, unspecified: Secondary | ICD-10-CM

## 2021-10-28 DIAGNOSIS — Q2112 Patent foramen ovale: Secondary | ICD-10-CM

## 2021-10-28 DIAGNOSIS — I10 Essential (primary) hypertension: Secondary | ICD-10-CM

## 2021-10-28 HISTORY — PX: TEE WITHOUT CARDIOVERSION: SHX5443

## 2021-10-28 HISTORY — PX: LOOP RECORDER INSERTION: EP1214

## 2021-10-28 HISTORY — PX: BUBBLE STUDY: SHX6837

## 2021-10-28 LAB — HOMOCYSTEINE: Homocysteine: 8.5 umol/L (ref 0.0–14.5)

## 2021-10-28 LAB — CBC
HCT: 38.4 % — ABNORMAL LOW (ref 39.0–52.0)
Hemoglobin: 12.7 g/dL — ABNORMAL LOW (ref 13.0–17.0)
MCH: 29.6 pg (ref 26.0–34.0)
MCHC: 33.1 g/dL (ref 30.0–36.0)
MCV: 89.5 fL (ref 80.0–100.0)
Platelets: 267 10*3/uL (ref 150–400)
RBC: 4.29 MIL/uL (ref 4.22–5.81)
RDW: 14 % (ref 11.5–15.5)
WBC: 5.2 10*3/uL (ref 4.0–10.5)
nRBC: 0 % (ref 0.0–0.2)

## 2021-10-28 LAB — BASIC METABOLIC PANEL
Anion gap: 5 (ref 5–15)
BUN: 7 mg/dL (ref 6–20)
CO2: 27 mmol/L (ref 22–32)
Calcium: 8.5 mg/dL — ABNORMAL LOW (ref 8.9–10.3)
Chloride: 104 mmol/L (ref 98–111)
Creatinine, Ser: 1.23 mg/dL (ref 0.61–1.24)
GFR, Estimated: >60 mL/min (ref >60)
Glucose, Bld: 99 mg/dL (ref 70–99)
Potassium: 4.1 mmol/L (ref 3.5–5.1)
Sodium: 136 mmol/L (ref 135–145)

## 2021-10-28 LAB — LUPUS ANTICOAGULANT PANEL
DRVVT: 31.6 s (ref 0.0–47.0)
PTT Lupus Anticoagulant: 28.7 s (ref 0.0–51.9)

## 2021-10-28 LAB — ANTINUCLEAR ANTIBODIES, IFA: ANA Ab, IFA: NEGATIVE

## 2021-10-28 SURGERY — LOOP RECORDER INSERTION

## 2021-10-28 SURGERY — ECHOCARDIOGRAM, TRANSESOPHAGEAL
Anesthesia: Monitor Anesthesia Care

## 2021-10-28 MED ORDER — CLOPIDOGREL BISULFATE 75 MG PO TABS: 75.0000 mg | ORAL_TABLET | Freq: Every day | ORAL | 0 refills | Status: DC

## 2021-10-28 MED ORDER — PROPOFOL 10 MG/ML IV BOLUS
INTRAVENOUS | Status: DC | PRN
  Administered 2021-10-28: 30 mg via INTRAVENOUS
  Administered 2021-10-28: 40 mg via INTRAVENOUS
  Administered 2021-10-28 (×2): 30 mg via INTRAVENOUS

## 2021-10-28 MED ORDER — LIDOCAINE-EPINEPHRINE 1 %-1:100000 IJ SOLN
INTRAMUSCULAR | Status: AC
  Filled 2021-10-28: qty 1

## 2021-10-28 MED ORDER — PROPOFOL 500 MG/50ML IV EMUL
INTRAVENOUS | Status: DC | PRN
  Administered 2021-10-28: 120 ug/kg/min via INTRAVENOUS

## 2021-10-28 MED ORDER — ASPIRIN 81 MG PO TBEC: 81.0000 mg | DELAYED_RELEASE_TABLET | Freq: Every day | ORAL | 3 refills | Status: DC

## 2021-10-28 MED ORDER — SODIUM CHLORIDE 0.9 % IV SOLN: INTRAVENOUS | Status: DC

## 2021-10-28 MED ORDER — CHLORHEXIDINE GLUCONATE CLOTH 2 % EX PADS: 6.0000 | MEDICATED_PAD | Freq: Every day | CUTANEOUS | Status: DC

## 2021-10-28 MED ORDER — LIDOCAINE-EPINEPHRINE 1 %-1:100000 IJ SOLN
INTRAMUSCULAR | Status: DC | PRN
  Administered 2021-10-28: 30 mL

## 2021-10-28 MED ORDER — PHENYLEPHRINE 40 MCG/ML (10ML) SYRINGE FOR IV PUSH (FOR BLOOD PRESSURE SUPPORT)
PREFILLED_SYRINGE | INTRAVENOUS | Status: DC | PRN
  Administered 2021-10-28: 120 ug via INTRAVENOUS

## 2021-10-28 SURGICAL SUPPLY — 2 items
PACK LOOP INSERTION (CUSTOM PROCEDURE TRAY) ×2 IMPLANT
SYSTEM MONITOR REVEAL LINQ II (Prosthesis & Implant Heart) ×2 IMPLANT

## 2021-10-28 NOTE — Discharge Instructions (Addendum)
TAKE ASA 81MG  AND PLAVIX 75MG  DAILY FOR 3 WEEKS AND THEN ASA DAILY ALONE YOU WILL SEE DR. AT [facility] FOR NEUROLOGY FOLLOW UP IN 4-6 WEEKS YOU WILL CONTINUE TO FOLLOW UP WITH YOUR CARDIOLOGIST DR. [place] [facility] WILL CONTINUE MONITOR YOUR LOOP RECORDER AND CHECK ON YOUR WOUND DR [place] WILL LET YOU KNOW IF YOUR "HOLE IN THE HEART" NEEDS TO BE CLOSED. IF ANY RECURRENT STROKE SYMPTOMS, PLEASE CALL 911 OR GO TO NEAREST ED FOR EVALUATION   Care After Your Loop Recorder  You have a [employer] Loop Recorder   Monitor your cardiac device site for redness, swelling, and drainage. Call the device clinic at [phone] if you experience these symptoms or fever/chills.  You may shower 3 days after your loop recorder implant and wash your incision with soap and water. Avoid lotions, ointments, or perfumes over your incision until it is well-healed.  You may use a hot tub or a pool AFTER your wound check appointment if the incision is completely closed.  Your device is MRI compatible.   Remote monitoring is used to monitor your cardiac device from home. This monitoring is scheduled every month days by our office. It allows Pearlean Brownie to keep an eye on the functioning of your device to ensure it is working properly.

## 2021-10-28 NOTE — Progress Notes (Signed)
  Echocardiogram Echocardiogram Transesophageal has been performed.  Roosvelt Maser F 10/28/2021, 2:13 PM

## 2021-10-28 NOTE — Progress Notes (Signed)
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 During assessment pt demonstrating expressive communication deficits however was able to effectively communicate. Demonstrated intact executive level functioning skills as indicated by his passing score on the PillBox Test. Subtle deficits with attention during cancellation tasks. Educated on warning signs/symptoms of CVA using BeFast. Discussion with pt/wife regarding return to work. If return to work is difficult, he needs to contact his neurologist and be further assessed by outpt OT. Neuropsych intervention may be helpful as well. No further acute OT needs. Signing off.     10/28/21 1300  OT Visit Information  Last OT Received On 10/28/21  Assistance Needed +1  History of Present Illness 60 yo male presents to ED on 11/2 with slurred speech, headache, R facial droop. CTH shows chronic L frontal lobe CVA (given acute change does not fully account for deficits), CTA head/neck shows no acute findings. TNK administered 11/2. PMH includes asthma, HTN, TIA, L femur IMN 2017.  Precautions  Precautions None  Home Living  Family/patient expects to be discharged to: Private residence  Living Arrangements Spouse/significant other  Available Help at Discharge Family  Type of Home House  Home Access Stairs to enter  Entrance Stairs-Number of Steps 3  Home Layout Able to live on main level with bedroom/bathroom  Bathroom Shower/Tub Walk-in Pension scheme manager Yes  Home Equipment None   Lives With Spouse  Prior Function  Prior Level of Function  Independent/Modified Independent  Mobility Comments pt reports independence with mobility, walks x100 miles/month  ADLs Comments Pt works in Airline pilot; Environmental health practitioner Expressive difficulties (wrod finding deficits at times in automatic speech; able to read adn repeat words wtihout  difficulty)  Pain Assessment  Pain Assessment No/denies pain  Cognition  Arousal/Alertness Awake/alert  Behavior During Therapy WFL for tasks assessed/performed  Overall Cognitive Status Impaired/Different from baseline  Area of Impairment Attention;Awareness  Current Attention Level Selective  Awareness Anticipatory  General Comments Passed the PillBox test without difficulty; missed 4 targets on letter cancellation task - able to fin error wtih cuing; most likley close to baseline  Upper Extremity Assessment  Upper Extremity Assessment Overall [facility] for tasks assessed  Lower Extremity Assessment  Lower Extremity Assessment Defer to PT evaluation  Cervical / Trunk Assessment  Cervical / Trunk Assessment Normal  Vision- History  Baseline Vision/History 1 Wears glasses  Patient Visual Report Other (comment)  Vision- Assessment  Additional Comments complaining of L eye "feeling heavy"  Bed Mobility  Overal bed mobility Independent  Transfers  Overall transfer level Independent  Equipment used None  Transfers Sit to/from Stand  Sit to Stand Supervision  General transfer comment for safety only  Balance  Overall balance assessment No apparent balance deficits (not formally assessed)  Sitting-balance support No upper extremity supported  Sitting balance-Leahy Scale Good  Standing balance-Leahy Scale Good  OT - End of Session  Activity Tolerance Patient tolerated treatment well  Patient left in bed;with call bell/phone within reach;with family/visitor present  OT Assessment  OT Recommendation/Assessment Patient does not need any further OT services  OT Visit Diagnosis Other symptoms and signs involving cognitive function;Cognitive communication deficit (R41.841)  OT Problem List Decreased cognition  AM-PAC OT "6 Clicks" Daily Activity Outcome Measure (Version 2)  Help from another person eating meals? 4  Help from another person taking care of personal grooming? 4  Help from  another person toileting, which includes  using toliet, bedpan, or urinal? 4  Help from another person bathing (including washing, rinsing, drying)? 4  Help from another person to put on and taking off regular upper body clothing? 4  Help from another person to put on and taking off regular lower body clothing? 4  6 Click Score 24  Progressive Mobility  What is the highest level of mobility based on the progressive mobility assessment? Level 6 (Walks independently in room and hall) - Balance while walking in room without assist - Complete  OT Recommendation  Recommendations for Other Services Speech consult  Follow Up Recommendations No OT follow up (follow up with MD if difficulty returning to wrok duties)  Assistance recommended at discharge Set up Supervision/Assistance  Functional Status Assessent Patient has had a recent decline in their functional status and demonstrates the ability to make significant improvements in function in a reasonable and predictable amount of time.  OT Equipment None recommended by OT  Acute Rehab OT Goals  Patient Stated Goal to have his procedure completed adn eventaully return home  OT Goal Formulation All assessment and education complete, DC therapy  OT Time Calculation  OT Start Time (ACUTE ONLY) 1144  OT Stop Time (ACUTE ONLY) 1210  OT Time Calculation (min) 26 min  OT General Charges  $OT Visit 1 Visit  OT Evaluation  $OT Eval Moderate Complexity 1 Mod  OT Treatments  $Therapeutic Activity 8-22 mins  Written Expression  Dominant Hand Right  Luisa Dago, OT/L   Acute OT Clinical Specialist Acute Rehabilitation Services Pager [phone] Office [phone]

## 2021-10-28 NOTE — Transfer of Care (Signed)
Immediate Anesthesia Transfer of Care Note  Patient: Scott Brandt  Procedure(s) Performed: TRANSESOPHAGEAL ECHOCARDIOGRAM (TEE) BUBBLE STUDY  Patient Location: PACU  Anesthesia Type:MAC  Level of Consciousness: drowsy  Airway & Oxygen Therapy: Patient Spontanous Breathing  Post-op Assessment: Report given to RN and Post -op Vital signs reviewed and stable  Post vital signs: Reviewed and stable  Last Vitals:  Vitals Value Taken Time  BP 104/61 10/28/21 1400  Temp    Pulse 57 10/28/21 1400  Resp 16 10/28/21 1400  SpO2 95 % 10/28/21 1400  Vitals shown include unvalidated device data.  Last Pain:  Vitals:   10/28/21 1230  TempSrc: Temporal  PainSc: 0-No pain         Complications: No notable events documented.

## 2021-10-28 NOTE — Progress Notes (Signed)
Lower extremity venous has been completed.   Preliminary results in CV Proc.   Aundra Millet Samanth Mirkin 10/28/2021 3:16 PM

## 2021-10-28 NOTE — CV Procedure (Signed)
Brief TEE Note  LVEF 60-65% Trivial TR, MR No LA/LAA thrombus or masses +PFO with R-->L flow noted on saline microcavitation study  For additional detail see full report.  Mathews Stuhr C. Duke Salvia, MD, [facility] 10/28/2021 1:55 PM

## 2021-10-28 NOTE — Anesthesia Postprocedure Evaluation (Signed)
Anesthesia Post Note  Patient: DHAIRYA CORALES  Procedure(s) Performed: TRANSESOPHAGEAL ECHOCARDIOGRAM (TEE) BUBBLE STUDY     Patient location during evaluation: PACU Anesthesia Type: MAC Level of consciousness: awake and alert Pain management: pain level controlled Vital Signs Assessment: post-procedure vital signs reviewed and stable Respiratory status: spontaneous breathing, nonlabored ventilation, respiratory function stable and patient connected to nasal cannula oxygen Cardiovascular status: stable and blood pressure returned to baseline Postop Assessment: no apparent nausea or vomiting Anesthetic complications: no   No notable events documented.  Last Vitals:  Vitals:   10/28/21 1438 10/28/21 1501  BP: (!) 134/95 (!) 155/92  Pulse: (!) 53 (!) 52  Resp: 15 16  Temp: 36.8 C (!) 36.3 C  SpO2: 100% 100%    Last Pain:  Vitals:   10/28/21 1501  TempSrc: Oral  PainSc: 0-No pain                 Lafe Clerk L Jamae Tison

## 2021-10-28 NOTE — Progress Notes (Addendum)
Pt discharged at this time.  Verbalizes understanding of all discharge instructions including follow up appointments, where to pick up prescriptions.  He has all belongings with him.

## 2021-10-28 NOTE — Progress Notes (Signed)
Physical Therapy Treatment & Discharge Patient Details Name: Scott Brandt MRN: [id number] DOB: 10/18/1961 Today's Date: 10/28/2021   History of Present Illness 60 yo male presents to ED on 11/2 with slurred speech, headache, R facial droop. CTH shows chronic L frontal lobe CVA (given acute change does not fully account for deficits), CTA head/neck shows no acute findings. TNK administered 11/2. PMH includes asthma, HTN, TIA, L femur IMN 2017.    PT Comments    Patient functioning at independent level for mobility with no AD. Able to negotiate flight of stairs independently. Patient with no apparent balance deficits with quick turns, backwards walking, and obstacle negotiation. Patient does not require further skilled PT needs acutely. No PT follow up recommended at this time. PT will sign off at this time.     Recommendations for follow up therapy are one component of a multi-disciplinary discharge planning process, led by the attending physician.  Recommendations may be updated based on patient status, additional functional criteria and insurance authorization.  Follow Up Recommendations  No PT follow up     Assistance Recommended at Discharge None  Equipment Recommendations  None recommended by PT    Recommendations for Other Services       Precautions / Restrictions Precautions Precautions: None Restrictions Weight Bearing Restrictions: No     Mobility  Bed Mobility Overal bed mobility: Independent                  Transfers Overall transfer level: Independent Equipment used: None                    Ambulation/Gait Ambulation/Gait assistance: Independent Gait Distance (Feet): 1000 Feet Assistive device: None Gait Pattern/deviations: WFL(Within Functional Limits)   Gait velocity interpretation: >4.37 ft/sec, indicative of normal walking speed     Stairs Stairs: Yes Stairs assistance: Independent Stair Management: No rails;Alternating  pattern;Forwards Number of Stairs: 15     Wheelchair Mobility    Modified Rankin (Stroke Patients Only) Modified Rankin (Stroke Patients Only) Pre-Morbid Rankin Score: No symptoms Modified Rankin: No symptoms     Balance Overall balance assessment: No apparent balance deficits (not formally assessed)                                          Cognition Arousal/Alertness: Awake/alert Behavior During Therapy: WFL for tasks assessed/performed Overall Cognitive Status: Within Functional Limits for tasks assessed                                          Exercises      General Comments        Pertinent Vitals/Pain Pain Assessment: No/denies pain    Home Living                          Prior Function            PT Goals (current goals can now be found in the care plan section) Acute Rehab PT Goals Patient Stated Goal: home PT Goal Formulation: With patient Time For Goal Achievement: 11/10/21 Potential to Achieve Goals: Good Progress towards PT goals: Goals met/education completed, patient discharged from PT    Frequency    Min 4X/week      PT Plan  Current plan remains appropriate    Co-evaluation              AM-PAC PT "6 Clicks" Mobility   Outcome Measure  Help needed turning from your back to your side while in a flat bed without using bedrails?: None Help needed moving from lying on your back to sitting on the side of a flat bed without using bedrails?: None Help needed moving to and from a bed to a chair (including a wheelchair)?: None Help needed standing up from a chair using your arms (e.g., wheelchair or bedside chair)?: None Help needed to walk in hospital room?: None Help needed climbing 3-5 steps with a railing? : None 6 Click Score: 24    End of Session   Activity Tolerance: Patient tolerated treatment well Patient left: in bed;with call bell/phone within reach Nurse Communication:  Mobility status PT Visit Diagnosis: Other abnormalities of gait and mobility (R26.89)     Time: 8882-8003 PT Time Calculation (min) (ACUTE ONLY): 18 min  Charges:  $Gait Training: 8-22 mins                     Margaretta Chittum A. Gilford Rile PT, DPT Acute Rehabilitation Services Pager [phone] Office [phone]    Linna Hoff 10/28/2021, 9:22 AM

## 2021-10-28 NOTE — Consult Note (Addendum)
ELECTROPHYSIOLOGY CONSULT NOTE  Patient ID: Scott Brandt L Booher MRN: [id number], DOB/AGE: [birth year]   Admit date: 10/26/2021 Date of Consult: 10/28/2021  Primary Physician: Karle PlumberArvind, Moogali M, MD Primary Cardiologist: Chilton Siiffany [place], MD  Primary Electrophysiologist: New to Dr. Elberta Fortisamnitz Reason for Consultation: Cryptogenic stroke; recommendations regarding Implantable Loop Recorder Insurance: BCBS  History of Present Illness EP has been asked to evaluate Scott PullingEric L Aja for placement of an implantable loop recorder to monitor for atrial fibrillation by Dr Roda ShuttersXu.  The patient was admitted on 10/26/2021 with right sided facial droop.    Imaging demonstrated left frontal infarct. Pt has known PFO    He has undergone workup for stroke including:  Code Stroke CT head No acute abnormality. Small vessel disease and old small left frontal cortical infarction ASPECTS 10.    CTA head & neck No LVO, stenosis or carotid disease MRI with : Multiple small acute infarcts in the posterior left frontal cortex (including precentral gyrus). Mild edema without mass effect. Mild-to-moderate chronic microvascular ischemic disease and small remote right cerebellar infarct. 2D Echo EF 55%, + bubble study, known PFO DVT US - PENDING TEE - PENDING LDL 60 HgbA1c 6.2 VTE prophylaxis - SCDs Aspirin 81 mg every other day  prior to admission, now on No antithrombotic. Secondary to within 24h TNK administration Therapy recommendations: Home. Disposition:  pending, likely home   The patient has been monitored on telemetry which has demonstrated sinus rhythm with no arrhythmias.  Inpatient stroke work-up Joee Iovine require a TEE per Neurology.   Echocardiogram as above. Lab work is reviewed.  Prior to admission, the patient denies chest pain, shortness of breath, dizziness, palpitations, or syncope.  He is recovering from his stroke with plans to return home  at discharge.  Past Medical History:  Diagnosis Date   Asthma     signs of asthma from Dr Sherene SiresWert    Asthma    Essential hypertension 02/24/2020   Hypercholesteremia    Left ventricular hypertrophy 02/24/2020   Lightheadedness 12/15/2020   TIA (transient ischemic attack) 2007   Urticaria      Surgical History:  Past Surgical History:  Procedure Laterality Date   FEMUR IM NAIL Left 02/28/2016   Procedure: INTRAMEDULLARY (IM) NAIL FEMORAL;  Surgeon: Durene RomansMatthew Olin, MD;  Location: [facility] OR;  Service: Orthopedics;  Laterality: Left;     Medications Prior to Admission  Medication Sig Dispense Refill Last Dose   ALPRAZolam (XANAX) 0.5 MG tablet Take 0.5 mg by mouth at bedtime as needed for anxiety.   07/14/2021   amLODipine (NORVASC) 10 MG tablet TAKE 1 TABLET DAILY (Patient taking differently: Take 10 mg by mouth daily.) 90 tablet 3 10/26/2021   aspirin 81 MG tablet Take 81 mg by mouth every other day.    10/26/2021   atorvastatin (LIPITOR) 20 MG tablet Take 1 tablet (20 mg total) every other day by mouth. (Patient taking differently: Take 20 mg by mouth daily.) 90 tablet 3 10/26/2021   brimonidine (ALPHAGAN) 0.2 % ophthalmic solution Place 1 drop into both eyes 2 (two) times daily.   10/24/2021   fexofenadine (ALLEGRA) 180 MG tablet Take 180 mg by mouth daily as needed for allergies.   10/25/2021   latanoprost (XALATAN) 0.005 % ophthalmic solution Place 1 drop into both eyes at bedtime.   10/24/2021    Inpatient Medications:   aspirin EC  81 mg Oral Daily   atorvastatin  20 mg Oral Daily   brimonidine  1 drop Both Eyes BID  chlorhexidine  15 mL Mouth/Throat BID   Chlorhexidine Gluconate Cloth  6 each Topical Daily   clopidogrel  75 mg Oral Daily   latanoprost  1 drop Both Eyes QHS   pantoprazole  40 mg Oral Daily   senna-docusate  1 tablet Oral BID    Allergies:  Allergies  Allergen Reactions   Shellfish Allergy Anaphylaxis    Social History   Socioeconomic History   Marital status: Married    Spouse name: Not on file   Number of children: Not on file    Years of education: Not on file   Highest education level: Not on file  Occupational History   Not on file  Tobacco Use   Smoking status: Never   Smokeless tobacco: Never  Vaping Use   Vaping Use: Never used  Substance and Sexual Activity   Alcohol use: Yes    Alcohol/week: 0.0 standard drinks    Comment: social   Drug use: No   Sexual activity: Yes    Partners: Female    Birth control/protection: None  Other Topics Concern   Not on file  Social History Narrative   ** Merged History Encounter **       Social Determinants of Health   Financial Resource Strain: Not on file  Food Insecurity: Not on file  Transportation Needs: Not on file  Physical Activity: Not on file  Stress: Not on file  Social Connections: Not on file  Intimate Partner Violence: Not on file     Family History  Problem Relation Age of Onset   Allergic rhinitis Father    Hyperlipidemia Mother    Hypertension Mother    Hypertension Sister    Diabetes Sister    Hypertension Brother    Hyperlipidemia Brother    Rheum arthritis Sister    Cancer Sister    Stroke Maternal Grandmother    Angioedema Neg Hx    Asthma Neg Hx    Atopy Neg Hx    Eczema Neg Hx    Immunodeficiency Neg Hx    Urticaria Neg Hx       Review of Systems: All other systems reviewed and are otherwise negative except as noted above.  Physical Exam: Vitals:   10/28/21 0700 10/28/21 0800 10/28/21 0900 10/28/21 1000  BP: (!) 137/95 123/82 (!) 142/97 121/73  Pulse: 60     Resp: 18 15 18 14   Temp:  98.3 F (36.8 C)    TempSrc:  Oral    SpO2: 100% 100%  98%  Weight:      Height:        GEN- The patient is well appearing, alert and oriented x 3 today.   Head- normocephalic, atraumatic Eyes-  Sclera clear, conjunctiva pink Ears- hearing intact Oropharynx- clear Neck- supple Lungs- Clear to ausculation bilaterally, normal work of breathing Heart- Regular rate and rhythm, no murmurs, rubs or gallops  GI- soft, NT, ND,  + BS Extremities- no clubbing, cyanosis, or edema MS- no significant deformity or atrophy Skin- no rash or lesion Psych- euthymic mood, full affect   Labs:   Lab Results  Component Value Date   WBC 5.2 10/28/2021   HGB 12.7 (L) 10/28/2021   HCT 38.4 (L) 10/28/2021   MCV 89.5 10/28/2021   PLT 267 10/28/2021    Recent Labs  Lab 10/26/21 1545 10/27/21 0830 10/28/21 0359  NA 138   < > 136  K 3.7   < > 4.1  CL 103   < >  104  CO2 26   < > 27  BUN 11   < > 7  CREATININE 1.27*   < > 1.23  CALCIUM 9.0   < > 8.5*  PROT 7.5  --   --   BILITOT 0.2*  --   --   ALKPHOS 92  --   --   ALT 26  --   --   AST 24  --   --   GLUCOSE 91   < > 99   < > = values in this interval not displayed.     Radiology/Studies: MR BRAIN WO CONTRAST  Result Date: 10/27/2021 CLINICAL DATA:  Stroke, follow up EXAM: MRI HEAD WITHOUT CONTRAST TECHNIQUE: Multiplanar, multiecho pulse sequences of the brain and surrounding structures were obtained without intravenous contrast. COMPARISON:  Same day CT head. FINDINGS: Brain: Multiple small acute infarcts in the posterior left frontal cortex (including precentral gyrus). Mild associated edema without mass effect. Additional mild-to-moderate scattered T2 hyperintensities not matter, nonspecific but canal chronic microvascular disease. Small old infarct in the right cerebellum. No hydrocephalus, mass lesion, midline shift, extra-axial fluid collection, or acute hemorrhage. Vascular: Major arterial flow voids are maintained at the skull base. Skull and upper cervical spine: Normal marrow signal. Sinuses/Orbits: No acute findings. Other: No mastoid effusions. IMPRESSION: 1. Multiple small acute infarcts in the posterior left frontal cortex (including precentral gyrus). Mild edema without mass effect. 2. Mild-to-moderate chronic microvascular ischemic disease and small remote right cerebellar infarct. Electronically Signed   By: Feliberto Harts M.D.   On: 10/27/2021 18:20    ECHOCARDIOGRAM COMPLETE BUBBLE STUDY  Result Date: 10/27/2021    ECHOCARDIOGRAM REPORT   Patient Name:   Oleg AVYAY COGER Date of Exam: 10/27/2021 Medical Rec #:  [id number]   Height:       72.0 in Accession #:    [id number]  Weight:       225.0 lb Date of Birth:  Oct 12, 1961   BSA:          2.240 m Patient Age:    60 years    BP:           112/70 mmHg Patient Gender: M           HR:           60 bpm. Exam Location:  Inpatient Procedure: 2D Echo, Color Doppler, Cardiac Doppler and Saline Contrast Bubble            Study Indications:    Stroke  History:        Patient has prior history of Echocardiogram examinations. Risk                 Factors:Hypertension.  Sonographer:    Cleatis Polka Referring Phys: [id number] Leda Gauze IMPRESSIONS  1. Left ventricular ejection fraction, by estimation, is 55%. The left ventricle has normal function. The left ventricle has no regional wall motion abnormalities. There is mild left ventricular hypertrophy. Left ventricular diastolic parameters are consistent with Grade I diastolic dysfunction (impaired relaxation).  2. Right ventricular systolic function is normal. The right ventricular size is normal. Tricuspid regurgitation signal is inadequate for assessing PA pressure.  3. The mitral valve is normal in structure. Trivial mitral valve regurgitation. No evidence of mitral stenosis.  4. The aortic valve is tricuspid. Aortic valve regurgitation is not visualized. No aortic stenosis is present.  5. The inferior vena cava is normal in size with greater than 50% respiratory variability, suggesting right atrial  pressure of 3 mmHg.  6. Positive bubble study, possible PFO. FINDINGS  Left Ventricle: Left ventricular ejection fraction, by estimation, is 55%. The left ventricle has normal function. The left ventricle has no regional wall motion abnormalities. The left ventricular internal cavity size was normal in size. There is mild left ventricular hypertrophy. Left ventricular  diastolic parameters are consistent with Grade I diastolic dysfunction (impaired relaxation). Right Ventricle: The right ventricular size is normal. No increase in right ventricular wall thickness. Right ventricular systolic function is normal. Tricuspid regurgitation signal is inadequate for assessing PA pressure. Left Atrium: Left atrial size was normal in size. Right Atrium: Right atrial size was normal in size. Pericardium: There is no evidence of pericardial effusion. Mitral Valve: The mitral valve is normal in structure. Trivial mitral valve regurgitation. No evidence of mitral valve stenosis. Tricuspid Valve: The tricuspid valve is normal in structure. Tricuspid valve regurgitation is not demonstrated. Aortic Valve: The aortic valve is tricuspid. Aortic valve regurgitation is not visualized. No aortic stenosis is present. Aortic valve peak gradient measures 6.0 mmHg. Pulmonic Valve: The pulmonic valve was normal in structure. Pulmonic valve regurgitation is not visualized. Aorta: The aortic root is normal in size and structure. Venous: The inferior vena cava is normal in size with greater than 50% respiratory variability, suggesting right atrial pressure of 3 mmHg. IAS/Shunts: Positive bubble study, possible PFO. Agitated saline contrast was given intravenously to evaluate for intracardiac shunting.  LEFT VENTRICLE PLAX 2D LVIDd:         4.20 cm      Diastology LVIDs:         2.30 cm      LV e' medial:    5.11 cm/s LV PW:         1.30 cm      LV E/e' medial:  12.2 LV IVS:        1.40 cm      LV e' lateral:   4.46 cm/s LVOT diam:     2.00 cm      LV E/e' lateral: 13.9 LV SV:         68 LV SV Index:   31 LVOT Area:     3.14 cm  LV Volumes (MOD) LV vol d, MOD A2C: 124.0 ml LV vol d, MOD A4C: 125.0 ml LV vol s, MOD A2C: 48.3 ml LV vol s, MOD A4C: 54.3 ml LV SV MOD A2C:     75.7 ml LV SV MOD A4C:     125.0 ml LV SV MOD BP:      73.1 ml RIGHT VENTRICLE             IVC RV Basal diam:  3.70 cm     IVC diam: 1.50 cm  RV Mid diam:    3.20 cm RV S prime:     12.50 cm/s TAPSE (M-mode): 2.1 cm LEFT ATRIUM             Index        RIGHT ATRIUM           Index LA diam:        4.20 cm 1.88 cm/m   RA Area:     16.40 cm LA Vol (A2C):   45.4 ml 20.27 ml/m  RA Volume:   41.60 ml  18.57 ml/m LA Vol (A4C):   47.0 ml 20.98 ml/m LA Biplane Vol: 46.4 ml 20.71 ml/m  AORTIC VALVE AV Area (Vmax): 2.70 cm AV Vmax:  122.00 cm/s AV Peak Grad:   6.0 mmHg LVOT Vmax:      105.00 cm/s LVOT Vmean:     69.900 cm/s LVOT VTI:       0.218 m  AORTA Ao Root diam: 2.80 cm Ao Asc diam:  2.70 cm MITRAL VALVE MV Area (PHT): 4.63 cm    SHUNTS MV Decel Time: 164 msec    Systemic VTI:  0.22 m MV E velocity: 62.20 cm/s  Systemic Diam: 2.00 cm MV A velocity: 73.10 cm/s MV E/A ratio:  0.85 Dalton McleanMD Electronically signed by Franki Monte Signature Date/Time: 10/27/2021/2:57:32 PM    Final    CT HEAD CODE STROKE WO CONTRAST  Result Date: 10/26/2021 CLINICAL DATA:  Code stroke. Neuro deficit, acute, stroke suspected. Slurred speech. Right facial droop. Symptoms began 13 hours. EXAM: CT HEAD WITHOUT CONTRAST TECHNIQUE: Contiguous axial images were obtained from the base of the skull through the vertex without intravenous contrast. COMPARISON:  None. FINDINGS: Brain: No sign of acute infarction, mass lesion, hemorrhage, hydrocephalus or extra-axial collection. Mild chronic small-vessel ischemic change of the hemispheric white matter. Old small left frontal cortical and subcortical infarction. Vascular: There is atherosclerotic calcification of the major vessels at the base of the brain. Skull: Negative Sinuses/Orbits: Clear/normal Other: None ASPECTS ([place] Stroke Program Early CT Score) - Ganglionic level infarction (caudate, lentiform nuclei, internal capsule, insula, M1-M3 cortex): 7 - Supraganglionic infarction (M4-M6 cortex): 3 Total score (0-10 with 10 being normal): 10 IMPRESSION: 1. No acute CT finding. Mild chronic small-vessel ischemic  change of the hemispheric white matter. Old small left frontal cortical infarction. 2. ASPECTS is 10. 3. These results were communicated to Dr. Cheral Marker at 3:15 pm on 10/26/2021 by text page via the [facility] messaging system. Electronically Signed   By: Nelson Chimes M.D.   On: 10/26/2021 15:19   CT ANGIO HEAD NECK W WO CM (CODE STROKE)  Result Date: 10/26/2021 CLINICAL DATA:  Right facial droop.  Slurred speech. EXAM: CT ANGIOGRAPHY HEAD AND NECK TECHNIQUE: Multidetector CT imaging of the head and neck was performed using the standard protocol during bolus administration of intravenous contrast. Multiplanar CT image reconstructions and MIPs were obtained to evaluate the vascular anatomy. Carotid stenosis measurements (when applicable) are obtained utilizing NASCET criteria, using the distal internal carotid diameter as the denominator. CONTRAST:  127mL OMNIPAQUE IOHEXOL 350 MG/ML SOLN COMPARISON:  Head CT earlier same day FINDINGS: CTA NECK FINDINGS Aortic arch: Aortic atherosclerosis.  Branching pattern is normal. Right carotid system: Common carotid artery widely patent to the bifurcation. Carotid bifurcation is normal. Cervical ICA is normal. Left carotid system: Common carotid artery widely patent to the bifurcation. Carotid bifurcation is normal. Cervical ICA is normal. Vertebral arteries: Both vertebral artery origins are widely patent. Both vertebral arteries are patent through the cervical region to the foramen magnum. Skeleton: Ordinary spondylosis. Other neck: No mass or lymphadenopathy. Upper chest: Lung apices are clear. Review of the MIP images confirms the above findings CTA HEAD FINDINGS Anterior circulation: Both internal carotid arteries widely patent through the skull base and siphon regions. Minimal siphon atherosclerotic calcification but no stenosis. The anterior and middle cerebral vessels are patent. No large vessel occlusion. No proximal stenosis. No aneurysm or vascular malformation.  Posterior circulation: Both vertebral arteries are patent to the basilar. No basilar stenosis. Posterior circulation branch vessels are normal. Patent posterior communicating arteries. Venous sinuses: Patent and normal. Anatomic variants: None significant. Review of the MIP images confirms the above findings IMPRESSION: Normal CT  angiography of the head and neck. No large vessel occlusion. No proximal stenosis. No carotid bifurcation disease. Electronically Signed   By: Nelson Chimes M.D.   On: 10/26/2021 15:40    12-lead ECG NSR at 61 bpm (personally reviewed) All prior EKG's in EPIC reviewed with no documented atrial fibrillation  Telemetry Sinus brady/NSR 50-70s (personally reviewed)  Assessment and Plan:  1. Cryptogenic stroke The patient presents with cryptogenic stroke.  The patient does have a TEE planned for this AM.  I spoke at length with the patient about monitoring for afib with an implantable loop recorder.  Risks, benefits, and alteratives to implantable loop recorder were discussed with the patient today.   At this time, the patient is very clear in their decision to proceed with implantable loop recorder. Pending other studies   Wound care was reviewed with the patient (keep incision clean and dry for 3 days).  Wound check scheduled and entered in AVS. Please call with questions.   Full disposition pending TEE and DVT US.   Shirley Friar, PA-C 10/28/2021 10:24 AM  I have seen and examined this patient with Oda Kilts.  Agree with above, note added to reflect my findings.  On exam, RRR, no murmurs.  Patient presented to the hospital with cryptogenic stroke. To date, no cause has been found. TEE planned for today. If unrevealing, Cinde Ebert plan for LINQ monitor to look for atrial fibrillation. Risks and benefits discussed. Risks include but not limited to bleeding and infection. The patient understands the risks and has agreed to the procedure.  Jihaad Bruschi M. Dareld Mcauliffe  MD 10/28/2021 2:57 PM

## 2021-10-28 NOTE — Progress Notes (Addendum)
STROKE TEAM PROGRESS NOTE   INTERVAL HISTORY No family is at the bedside. Pt walking in the room and hallway without problems. His symptoms resolved. However, MRI showed left frontal small cortical infarcts. Pending TEE today. TTE yesterday showed positive PFO. LE venous doppler also ordered. Loop recorder discussed with pt at the bedside and wife on the phone.   Vitals:   10/28/21 0700 10/28/21 0800 10/28/21 0900 10/28/21 1000  BP: (!) 137/95 123/82 (!) 142/97 121/73  Pulse: 60     Resp: 18 15 18 14   Temp:  98.3 F (36.8 C)    TempSrc:  Oral    SpO2: 100% 100%  98%  Weight:      Height:       CBC:  Recent Labs  Lab 10/26/21 1545 10/27/21 0830 10/28/21 0359  WBC 5.2 5.1 5.2  NEUTROABS 2.4  --   --   HGB 13.5 13.0 12.7*  HCT 40.9 38.5* 38.4*  MCV 90.1 88.7 89.5  PLT 286 271 267   Basic Metabolic Panel:  Recent Labs  Lab 10/27/21 0830 10/28/21 0359  NA 138 136  K 3.9 4.1  CL 106 104  CO2 26 27  GLUCOSE 87 99  BUN 8 7  CREATININE 1.13 1.23  CALCIUM 8.7* 8.5*   Lipid Panel:  Recent Labs  Lab 10/27/21 0342  CHOL 120  TRIG 38  HDL 52  CHOLHDL 2.3  VLDL 8  LDLCALC 60   HgbA1c:  Recent Labs  Lab 10/27/21 0342  HGBA1C 6.2*   Urine Drug Screen:  Recent Labs  Lab 10/27/21 0800  LABOPIA NONE DETECTED  COCAINSCRNUR NONE DETECTED  LABBENZ NONE DETECTED  AMPHETMU NONE DETECTED  THCU NONE DETECTED  LABBARB NONE DETECTED    Alcohol Level No results for input(s): ETH in the last 168 hours.  IMAGING past 24 hours MR BRAIN WO CONTRAST  Result Date: 10/27/2021 CLINICAL DATA:  Stroke, follow up EXAM: MRI HEAD WITHOUT CONTRAST TECHNIQUE: Multiplanar, multiecho pulse sequences of the brain and surrounding structures were obtained without intravenous contrast. COMPARISON:  Same day CT head. FINDINGS: Brain: Multiple small acute infarcts in the posterior left frontal cortex (including precentral gyrus). Mild associated edema without mass effect. Additional  mild-to-moderate scattered T2 hyperintensities not matter, nonspecific but canal chronic microvascular disease. Small old infarct in the right cerebellum. No hydrocephalus, mass lesion, midline shift, extra-axial fluid collection, or acute hemorrhage. Vascular: Major arterial flow voids are maintained at the skull base. Skull and upper cervical spine: Normal marrow signal. Sinuses/Orbits: No acute findings. Other: No mastoid effusions. IMPRESSION: 1. Multiple small acute infarcts in the posterior left frontal cortex (including precentral gyrus). Mild edema without mass effect. 2. Mild-to-moderate chronic microvascular ischemic disease and small remote right cerebellar infarct. Electronically Signed   By: 13/02/2021 M.D.   On: 10/27/2021 18:20    PHYSICAL EXAM  Temp:  [97.7 F (36.5 C)-98.8 F (37.1 C)] 98.3 F (36.8 C) (11/04 0800) Pulse Rate:  [54-65] 60 (11/04 0700) Resp:  [9-24] 14 (11/04 1000) BP: (102-142)/(60-97) 121/73 (11/04 1000) SpO2:  [91 %-100 %] 98 % (11/04 1000)  General - Well nourished, well developed, in no apparent distress.  Ophthalmologic - fundi not visualized due to noncooperation.  Cardiovascular - Regular rhythm and rate.  Mental Status -  Level of arousal and orientation to time, place, and person were intact. Language including expression, naming, repetition, comprehension was assessed and found intact. Fund of Knowledge was assessed and was intact.  Cranial Nerves  II - XII - II - Visual field intact OU. III, IV, VI - Extraocular movements intact. V - Facial sensation intact bilaterally. VII - Facial movement intact bilaterally. VIII - Hearing & vestibular intact bilaterally. X - Palate elevates symmetrically. XI - Chin turning & shoulder shrug intact bilaterally. XII - Tongue protrusion intact.  Motor Strength - The patient's strength was normal in all extremities and pronator drift was absent.  Bulk was normal and fasciculations were absent.    Motor Tone - Muscle tone was assessed at the neck and appendages and was normal.  Reflexes - The patient's reflexes were symmetrical in all extremities and he had no pathological reflexes.  Sensory - Light touch, temperature/pinprick were assessed and were symmetrical.    Coordination - The patient had normal movements in the hands and feet with no ataxia or dysmetria.  Tremor was absent.  Gait and Station - deferred.    ASSESSMENT/PLAN [name] is a 60 y.o. male with history of stroke, asthma, HTN and HLD presenting with aphasia and right sided facial droop s/p TNK.  Patient states that he feels no residual deficits now and believes that his speech is back to baseline.  He has a possible history of a PFO, so TEE will be performed to evaluate for this.  Patient will also be evaluated for possible loop recorder placement to evaluate for occult atrial fibrillation.  Stroke:  left frontal infarct secondary to unknown cause, could be related to PFO vs. Occult afib Code Stroke CT head No acute abnormality. Small vessel disease and old small left frontal cortical infarction ASPECTS 10.    CTA head & neck No LVO, stenosis or carotid disease MRI Left frontal cortical small scattered infarcts 2D Echo EF 55% with positive PFO Plan for TEE today and possible loop recorder. LDL 60 HgbA1c 6.2 VTE prophylaxis - SCDs Aspirin 81 mg every other day  prior to admission, now on ASA 81 and plavxi 75 DAPT.  Therapy recommendations: none Disposition:  pending, likely home  PFO Per pt, he was told to have PFO 15 years ago when he had stroke in 2007 Current TTE showed positive PFO with bubble study TEE pending today ROPE score = 4 May consider PFO closure if loop recorder did not show afib in 3 months.  Will refer to Dr. Excell Seltzer to take a look.   Hx of stroke Per patient, he had a stroke/TIA in 2007 with a similar presentation with aphasia, he was admitted in [place] for stroke work-up,  found to have a PFO but no TEE done and no specific treatment at that time.  Hypertension Home meds:  Norvasc 10 mg daily Stable Long-term BP goal normotensive  Hyperlipidemia Home meds:  Atorvastatin 20 mg daily, resumed in hospital LDL 60, goal < 70 High intensity statin not needed as LDL below goal Continue statin at discharge  Other Stroke Risk Factors Obesity, Body mass index is 30.52 kg/m., BMI >/= 30 associated with increased stroke risk, recommend weight loss, diet and exercise as appropriate    Other Active Problems Asthma - Patient taking no medications for this at home   Hospital day # 2   Marvel Plan, MD PhD Stroke Neurology 10/28/2021 10:27 AM    To contact Stroke Continuity provider, please refer to [email or web address]. After hours, contact General Neurology

## 2021-10-28 NOTE — Discharge Summary (Signed)
Stroke Discharge Summary  Patient ID: Scott Brandt   MRN: [id number]      DOB: Oct 24, 1961  Date of Admission: 10/26/2021 Date of Discharge: 10/28/2021  Attending Physician:  Stroke, Md, MD, Stroke MD Consultant(s):    cardiology EP Patient's PCP:  Karle Plumber, MD  DISCHARGE DIAGNOSIS:  Stroke:  left frontal infarct secondary to unknown cause, could be related to PFO vs. Occult afib  Active Problems: PFO Hx of stroke HTN HLD Obesity asthma   Allergies as of 10/28/2021       Reactions   Shellfish Allergy Anaphylaxis        Medication List     STOP taking these medications    aspirin 81 MG tablet Replaced by: aspirin 81 MG EC tablet       TAKE these medications    ALPRAZolam 0.5 MG tablet Commonly known as: XANAX Take 0.5 mg by mouth at bedtime as needed for anxiety.   amLODipine 10 MG tablet Commonly known as: NORVASC TAKE 1 TABLET DAILY   aspirin 81 MG EC tablet Take 1 tablet (81 mg total) by mouth daily. Swallow whole. Start taking on: October 29, 2021 Replaces: aspirin 81 MG tablet   atorvastatin 20 MG tablet Commonly known as: LIPITOR Take 1 tablet (20 mg total) every other day by mouth. What changed: when to take this   brimonidine 0.2 % ophthalmic solution Commonly known as: ALPHAGAN Place 1 drop into both eyes 2 (two) times daily.   clopidogrel 75 MG tablet Commonly known as: PLAVIX Take 1 tablet (75 mg total) by mouth daily. Start taking on: October 29, 2021   fexofenadine 180 MG tablet Commonly known as: ALLEGRA Take 180 mg by mouth daily as needed for allergies.   latanoprost 0.005 % ophthalmic solution Commonly known as: XALATAN Place 1 drop into both eyes at bedtime.        LABORATORY STUDIES CBC    Component Value Date/Time   WBC 5.2 10/28/2021 0359   RBC 4.29 10/28/2021 0359   HGB 12.7 (L) 10/28/2021 0359   HGB 15.1 08/20/2020 0930   HCT 38.4 (L) 10/28/2021 0359   HCT 43.3 08/20/2020 0930   PLT 267  10/28/2021 0359   PLT 272 08/20/2020 0930   MCV 89.5 10/28/2021 0359   MCV 89 08/20/2020 0930   MCH 29.6 10/28/2021 0359   MCHC 33.1 10/28/2021 0359   RDW 14.0 10/28/2021 0359   RDW 13.2 08/20/2020 0930   LYMPHSABS 2.2 10/26/2021 1545   LYMPHSABS 2.3 08/20/2020 0930   MONOABS 0.5 10/26/2021 1545   EOSABS 0.1 10/26/2021 1545   EOSABS 0.1 08/20/2020 0930   BASOSABS 0.1 10/26/2021 1545   BASOSABS 0.1 08/20/2020 0930   CMP    Component Value Date/Time   NA 136 10/28/2021 0359   NA 138 08/20/2020 0930   K 4.1 10/28/2021 0359   CL 104 10/28/2021 0359   CO2 27 10/28/2021 0359   GLUCOSE 99 10/28/2021 0359   BUN 7 10/28/2021 0359   BUN 11 08/20/2020 0930   CREATININE 1.23 10/28/2021 0359   CALCIUM 8.5 (L) 10/28/2021 0359   PROT 7.5 10/26/2021 1545   PROT 7.9 08/20/2020 0930   ALBUMIN 3.9 10/26/2021 1545   ALBUMIN 4.6 08/20/2020 0930   AST 24 10/26/2021 1545   ALT 26 10/26/2021 1545   ALKPHOS 92 10/26/2021 1545   BILITOT 0.2 (L) 10/26/2021 1545   BILITOT 0.4 08/20/2020 0930   GFRNONAA >60 10/28/2021 0359  GFRAA 78 08/20/2020 0930   COAGS Lab Results  Component Value Date   INR 1.0 10/26/2021   INR 1.03 02/28/2016   Lipid Panel    Component Value Date/Time   CHOL 120 10/27/2021 0342   CHOL 169 08/20/2020 0930   TRIG 38 10/27/2021 0342   HDL 52 10/27/2021 0342   HDL 76 08/20/2020 0930   CHOLHDL 2.3 10/27/2021 0342   VLDL 8 10/27/2021 0342   LDLCALC 60 10/27/2021 0342   LDLCALC 79 08/20/2020 0930   HgbA1C  Lab Results  Component Value Date   HGBA1C 6.2 (H) 10/27/2021   Urinalysis No results found for: COLORURINE, APPEARANCEUR, LABSPEC, PHURINE, GLUCOSEU, HGBUR, BILIRUBINUR, KETONESUR, PROTEINUR, UROBILINOGEN, NITRITE, LEUKOCYTESUR Urine Drug Screen     Component Value Date/Time   LABOPIA NONE DETECTED 10/27/2021 0800   COCAINSCRNUR NONE DETECTED 10/27/2021 0800   LABBENZ NONE DETECTED 10/27/2021 0800   AMPHETMU NONE DETECTED 10/27/2021 0800   THCU NONE  DETECTED 10/27/2021 0800   LABBARB NONE DETECTED 10/27/2021 0800    Alcohol Level No results found for: ETH   SIGNIFICANT DIAGNOSTIC STUDIES MR BRAIN WO CONTRAST  Result Date: 10/27/2021 CLINICAL DATA:  Stroke, follow up EXAM: MRI HEAD WITHOUT CONTRAST TECHNIQUE: Multiplanar, multiecho pulse sequences of the brain and surrounding structures were obtained without intravenous contrast. COMPARISON:  Same day CT head. FINDINGS: Brain: Multiple small acute infarcts in the posterior left frontal cortex (including precentral gyrus). Mild associated edema without mass effect. Additional mild-to-moderate scattered T2 hyperintensities not matter, nonspecific but canal chronic microvascular disease. Small old infarct in the right cerebellum. No hydrocephalus, mass lesion, midline shift, extra-axial fluid collection, or acute hemorrhage. Vascular: Major arterial flow voids are maintained at the skull base. Skull and upper cervical spine: Normal marrow signal. Sinuses/Orbits: No acute findings. Other: No mastoid effusions. IMPRESSION: 1. Multiple small acute infarcts in the posterior left frontal cortex (including precentral gyrus). Mild edema without mass effect. 2. Mild-to-moderate chronic microvascular ischemic disease and small remote right cerebellar infarct. Electronically Signed   By: Margaretha Sheffield M.D.   On: 10/27/2021 18:20   EP PPM/ICD IMPLANT  Result Date: 10/28/2021 SURGEON:  Allegra Lai, MD   PREPROCEDURE DIAGNOSIS:  Cryptogenic Stroke   POSTPROCEDURE DIAGNOSIS:  Cryptogenic Stroke    PROCEDURES:  1. Implantable loop recorder implantation   INTRODUCTION:  Scott Brandt is a 60 y.o. male with a history of unexplained stroke who presents today for implantable loop implantation.  The patient has had a cryptogenic stroke.  Despite an extensive workup by neurology, no reversible causes have been identified.  he has worn telemetry during which he did not have arrhythmias.  There is significant concern for  possible atrial fibrillation as the cause for the patients stroke.  The patient therefore presents today for implantable loop implantation.   DESCRIPTION OF PROCEDURE:  Informed written consent was obtained, and the patient was brought to the electrophysiology lab in a fasting state.  The patient required no sedation for the procedure today.  Mapping over the patient's chest was performed by the EP lab staff to identify the area where electrograms were most prominent for ILR recording.  This area was found to be the left parasternal region over the 3rd-4th intercostal space. The patients left chest was therefore prepped and draped in the usual sterile fashion by the EP lab staff. The skin overlying the left parasternal region was infiltrated with lidocaine for local analgesia.  A 0.5-cm incision was made over the left parasternal region over  the 3rd intercostal space.  A subcutaneous ILR pocket was fashioned using a combination of sharp and blunt dissection.  A Medtronic Reveal Linq model [place] [id number] implantable loop recorder was then placed into the pocket  R waves were very prominent and measured 0.60mV. EBL<1 ml.  Steri- Strips and a sterile dressing were then applied.  There were no early apparent complications.   CONCLUSIONS:  1. Successful implantation of a Medtronic Reveal LINQ implantable loop recorder for cryptogenic stroke  2. No early apparent complications.   ECHOCARDIOGRAM COMPLETE BUBBLE STUDY  Result Date: 10/27/2021    ECHOCARDIOGRAM REPORT   Patient Name:   Scott Brandt Date of Exam: 10/27/2021 Medical Rec #:  [id number]   Height:       72.0 in Accession #:    [id number]  Weight:       225.0 lb Date of Birth:  10-12-1961   BSA:          2.240 m Patient Age:    60 years    BP:           112/70 mmHg Patient Gender: M           HR:           60 bpm. Exam Location:  Inpatient Procedure: 2D Echo, Color Doppler, Cardiac Doppler and Saline Contrast Bubble            Study Indications:    Stroke   History:        Patient has prior history of Echocardiogram examinations. Risk                 Factors:Hypertension.  Sonographer:    Jyl Heinz Referring Phys: [place]  1. Left ventricular ejection fraction, by estimation, is 55%. The left ventricle has normal function. The left ventricle has no regional wall motion abnormalities. There is mild left ventricular hypertrophy. Left ventricular diastolic parameters are consistent with Grade I diastolic dysfunction (impaired relaxation).  2. Right ventricular systolic function is normal. The right ventricular size is normal. Tricuspid regurgitation signal is inadequate for assessing PA pressure.  3. The mitral valve is normal in structure. Trivial mitral valve regurgitation. No evidence of mitral stenosis.  4. The aortic valve is tricuspid. Aortic valve regurgitation is not visualized. No aortic stenosis is present.  5. The inferior vena cava is normal in size with greater than 50% respiratory variability, suggesting right atrial pressure of 3 mmHg.  6. Positive bubble study, possible PFO. FINDINGS  Left Ventricle: Left ventricular ejection fraction, by estimation, is 55%. The left ventricle has normal function. The left ventricle has no regional wall motion abnormalities. The left ventricular internal cavity size was normal in size. There is mild left ventricular hypertrophy. Left ventricular diastolic parameters are consistent with Grade I diastolic dysfunction (impaired relaxation). Right Ventricle: The right ventricular size is normal. No increase in right ventricular wall thickness. Right ventricular systolic function is normal. Tricuspid regurgitation signal is inadequate for assessing PA pressure. Left Atrium: Left atrial size was normal in size. Right Atrium: Right atrial size was normal in size. Pericardium: There is no evidence of pericardial effusion. Mitral Valve: The mitral valve is normal in structure. Trivial mitral valve  regurgitation. No evidence of mitral valve stenosis. Tricuspid Valve: The tricuspid valve is normal in structure. Tricuspid valve regurgitation is not demonstrated. Aortic Valve: The aortic valve is tricuspid. Aortic valve regurgitation is not visualized. No aortic stenosis is present. Aortic valve peak gradient  measures 6.0 mmHg. Pulmonic Valve: The pulmonic valve was normal in structure. Pulmonic valve regurgitation is not visualized. Aorta: The aortic root is normal in size and structure. Venous: The inferior vena cava is normal in size with greater than 50% respiratory variability, suggesting right atrial pressure of 3 mmHg. IAS/Shunts: Positive bubble study, possible PFO. Agitated saline contrast was given intravenously to evaluate for intracardiac shunting.  LEFT VENTRICLE PLAX 2D LVIDd:         4.20 cm      Diastology LVIDs:         2.30 cm      LV e' medial:    5.11 cm/s LV PW:         1.30 cm      LV E/e' medial:  12.2 LV IVS:        1.40 cm      LV e' lateral:   4.46 cm/s LVOT diam:     2.00 cm      LV E/e' lateral: 13.9 LV SV:         68 LV SV Index:   31 LVOT Area:     3.14 cm  LV Volumes (MOD) LV vol d, MOD A2C: 124.0 ml LV vol d, MOD A4C: 125.0 ml LV vol s, MOD A2C: 48.3 ml LV vol s, MOD A4C: 54.3 ml LV SV MOD A2C:     75.7 ml LV SV MOD A4C:     125.0 ml LV SV MOD BP:      73.1 ml RIGHT VENTRICLE             IVC RV Basal diam:  3.70 cm     IVC diam: 1.50 cm RV Mid diam:    3.20 cm RV S prime:     12.50 cm/s TAPSE (M-mode): 2.1 cm LEFT ATRIUM             Index        RIGHT ATRIUM           Index LA diam:        4.20 cm 1.88 cm/m   RA Area:     16.40 cm LA Vol (A2C):   45.4 ml 20.27 ml/m  RA Volume:   41.60 ml  18.57 ml/m LA Vol (A4C):   47.0 ml 20.98 ml/m LA Biplane Vol: 46.4 ml 20.71 ml/m  AORTIC VALVE AV Area (Vmax): 2.70 cm AV Vmax:        122.00 cm/s AV Peak Grad:   6.0 mmHg LVOT Vmax:      105.00 cm/s LVOT Vmean:     69.900 cm/s LVOT VTI:       0.218 m  AORTA Ao Root diam: 2.80 cm Ao Asc  diam:  2.70 cm MITRAL VALVE MV Area (PHT): 4.63 cm    SHUNTS MV Decel Time: 164 msec    Systemic VTI:  0.22 m MV E velocity: 62.20 cm/s  Systemic Diam: 2.00 cm MV A velocity: 73.10 cm/s MV E/A ratio:  0.85 Dalton McleanMD Electronically signed by Franki Monte Signature Date/Time: 10/27/2021/2:57:32 PM    Final    CT HEAD CODE STROKE WO CONTRAST  Result Date: 10/26/2021 CLINICAL DATA:  Code stroke. Neuro deficit, acute, stroke suspected. Slurred speech. Right facial droop. Symptoms began 13 hours. EXAM: CT HEAD WITHOUT CONTRAST TECHNIQUE: Contiguous axial images were obtained from the base of the skull through the vertex without intravenous contrast. COMPARISON:  None. FINDINGS: Brain: No sign of acute infarction, mass lesion, hemorrhage,  hydrocephalus or extra-axial collection. Mild chronic small-vessel ischemic change of the hemispheric white matter. Old small left frontal cortical and subcortical infarction. Vascular: There is atherosclerotic calcification of the major vessels at the base of the brain. Skull: Negative Sinuses/Orbits: Clear/normal Other: None ASPECTS ([place] Stroke Program Early CT Score) - Ganglionic level infarction (caudate, lentiform nuclei, internal capsule, insula, M1-M3 cortex): 7 - Supraganglionic infarction (M4-M6 cortex): 3 Total score (0-10 with 10 being normal): 10 IMPRESSION: 1. No acute CT finding. Mild chronic small-vessel ischemic change of the hemispheric white matter. Old small left frontal cortical infarction. 2. ASPECTS is 10. 3. These results were communicated to Dr. Cheral Marker at 3:15 pm on 10/26/2021 by text page via the [facility] messaging system. Electronically Signed   By: Nelson Chimes M.D.   On: 10/26/2021 15:19   VAS [place] LOWER EXTREMITY VENOUS (DVT)  Result Date: 10/28/2021  Lower Venous DVT Study Patient Name:  Scott Brandt  Date of Exam:   10/28/2021 Medical Rec #: [id number]    Accession #:    [id number] Date of Birth: [birth year]    Patient Gender: M Patient Age:   10  years Exam Location:  [facility] Procedure:      VAS [place] LOWER EXTREMITY VENOUS (DVT) Referring Phys: Barrington Ellison --------------------------------------------------------------------------------  Indications: Stroke.  Comparison Study: no prior Performing Technologist: Archie Patten RVS  Examination Guidelines: A complete evaluation includes B-mode imaging, spectral Doppler, color Doppler, and power Doppler as needed of all accessible portions of each vessel. Bilateral testing is considered an integral part of a complete examination. Limited examinations for reoccurring indications may be performed as noted. The reflux portion of the exam is performed with the patient in reverse Trendelenburg.  +---------+---------------+---------+-----------+----------+--------------+ RIGHT    CompressibilityPhasicitySpontaneityPropertiesThrombus Aging +---------+---------------+---------+-----------+----------+--------------+ CFV      Full           Yes      Yes                                 +---------+---------------+---------+-----------+----------+--------------+ SFJ      Full                                                        +---------+---------------+---------+-----------+----------+--------------+ FV Prox  Full                                                        +---------+---------------+---------+-----------+----------+--------------+ FV Mid   Full                                                        +---------+---------------+---------+-----------+----------+--------------+ FV DistalFull                                                        +---------+---------------+---------+-----------+----------+--------------+  PFV      Full                                                        +---------+---------------+---------+-----------+----------+--------------+ POP      Full           Yes      Yes                                  +---------+---------------+---------+-----------+----------+--------------+ PTV      Full                                                        +---------+---------------+---------+-----------+----------+--------------+ PERO     Full                                                        +---------+---------------+---------+-----------+----------+--------------+   +---------+---------------+---------+-----------+----------+--------------+ LEFT     CompressibilityPhasicitySpontaneityPropertiesThrombus Aging +---------+---------------+---------+-----------+----------+--------------+ CFV      Full           Yes      Yes                                 +---------+---------------+---------+-----------+----------+--------------+ SFJ      Full                                                        +---------+---------------+---------+-----------+----------+--------------+ FV Prox  Full                                                        +---------+---------------+---------+-----------+----------+--------------+ FV Mid   Full                                                        +---------+---------------+---------+-----------+----------+--------------+ FV DistalFull                                                        +---------+---------------+---------+-----------+----------+--------------+ PFV      Full                                                        +---------+---------------+---------+-----------+----------+--------------+  POP      Full           Yes      Yes                                 +---------+---------------+---------+-----------+----------+--------------+ PTV      Full                                                        +---------+---------------+---------+-----------+----------+--------------+ PERO     Full                                                         +---------+---------------+---------+-----------+----------+--------------+     Summary: BILATERAL: - No evidence of deep vein thrombosis seen in the lower extremities, bilaterally. -No evidence of popliteal cyst, bilaterally.   *See table(s) above for measurements and observations. Electronically signed by Monica Martinez MD on 10/28/2021 at 5:19:31 PM.    Final    CT ANGIO HEAD NECK W WO CM (CODE STROKE)  Result Date: 10/26/2021 CLINICAL DATA:  Right facial droop.  Slurred speech. EXAM: CT ANGIOGRAPHY HEAD AND NECK TECHNIQUE: Multidetector CT imaging of the head and neck was performed using the standard protocol during bolus administration of intravenous contrast. Multiplanar CT image reconstructions and MIPs were obtained to evaluate the vascular anatomy. Carotid stenosis measurements (when applicable) are obtained utilizing NASCET criteria, using the distal internal carotid diameter as the denominator. CONTRAST:  129mL OMNIPAQUE IOHEXOL 350 MG/ML SOLN COMPARISON:  Head CT earlier same day FINDINGS: CTA NECK FINDINGS Aortic arch: Aortic atherosclerosis.  Branching pattern is normal. Right carotid system: Common carotid artery widely patent to the bifurcation. Carotid bifurcation is normal. Cervical ICA is normal. Left carotid system: Common carotid artery widely patent to the bifurcation. Carotid bifurcation is normal. Cervical ICA is normal. Vertebral arteries: Both vertebral artery origins are widely patent. Both vertebral arteries are patent through the cervical region to the foramen magnum. Skeleton: Ordinary spondylosis. Other neck: No mass or lymphadenopathy. Upper chest: Lung apices are clear. Review of the MIP images confirms the above findings CTA HEAD FINDINGS Anterior circulation: Both internal carotid arteries widely patent through the skull base and siphon regions. Minimal siphon atherosclerotic calcification but no stenosis. The anterior and middle cerebral vessels are patent. No large vessel  occlusion. No proximal stenosis. No aneurysm or vascular malformation. Posterior circulation: Both vertebral arteries are patent to the basilar. No basilar stenosis. Posterior circulation branch vessels are normal. Patent posterior communicating arteries. Venous sinuses: Patent and normal. Anatomic variants: None significant. Review of the MIP images confirms the above findings IMPRESSION: Normal CT angiography of the head and neck. No large vessel occlusion. No proximal stenosis. No carotid bifurcation disease. Electronically Signed   By: Nelson Chimes M.D.   On: 10/26/2021 15:40      HISTORY OF PRESENT ILLNESS Scott Brandt is a 60 y.o. male with a PMHx of TIA 2006, asthma, HTN, and HLD who presented to [facility] ED for assessment of acute dysphasia and right facial droop. LKW 1200.  Per Neuro tele note by Dr. Cheral Marker, "  Earlier today, he had been on a Zoom call for training and was normal. Afterwards, he went out to blow some leaves without any symptoms at that time. Wife was out of the home, but when she came back, the patient told her that he started to feel strange and not himself at 1 PM, but did not complain of any focal deficit. Later, he was trying to speak to his wife but could not "get the words together". Wife also noted a mild right facial droop, so she decided to take him to [facility]. In the ED his speech improved slightly, but he continued to have word finding difficulties intermittently. "   "He states that earlier today, he had a left sided headache consisting of sharp pain that then subsided. He no longer has a headache. He denies any limb weakness or vision change. Does not endorse any dizziness, lightheadedness or difficulty ambulating. He has had some gum bleeding in the mornings for the past 4 months for which he has scheduled an appointment with a dentist. "   "Home medications include ASA and atorvastatin (20 mg qd). He is not on a blood thinner."   Wife reports possible "born with hole in his  heart" that was told by cards when he had a speech related "TIA" in 2007.   NIHSS was 3-1 for dysarthria, 1 for aphasia, and 1 for facial palsy.    Patient received TNK per tele consult and was transferred here for post TNK admission to ICU.    LKW: 1200 TNK: yes at [facility].  IR Thrombectomy? No, not a candidate.  MRS: 0   HOSPITAL COURSE Scott Brandt is a 60 y.o. male with history of stroke, asthma, HTN and HLD presenting with aphasia and right sided facial droop s/p TNK.  Patient states that he feels no residual deficits now and believes that his speech is back to baseline.  He has a possible history of a PFO, so TEE will be performed to evaluate for this.  Patient will also be evaluated for possible loop recorder placement to evaluate for occult atrial fibrillation.   Stroke:  left frontal infarct secondary to unknown cause, could be related to PFO vs. Occult afib Code Stroke CT head No acute abnormality. Small vessel disease and old small left frontal cortical infarction ASPECTS 10.    CTA head & neck No LVO, stenosis or carotid disease MRI Left frontal cortical small scattered infarcts 2D Echo EF 55% with positive PFO TEE confirmed PFO, official report pending LDL 60 HgbA1c 6.2 Hypercoagulable work up pending VTE prophylaxis - SCDs Aspirin 81 mg every other day  prior to admission, now on ASA 81 and plavxi 75 DAPT for 3 weeks and then ASA 81 daily  Therapy recommendations: none Disposition:  home   PFO Per pt, he was told to have PFO 15 years ago when he had stroke in 2007 Current TTE showed positive PFO with bubble study TEE confirmed PFO, official report pending ROPE score = 4 May consider PFO closure if loop recorder did not show afib in 3 months.  Will refer to Dr. Burt Knack to be seen in 2-3 months   Hx of stroke Per patient, he had a stroke/TIA in 2007 with a similar presentation with aphasia, he was admitted in [place] for stroke work-up, found to have a PFO but no  TEE done and no specific treatment at that time.   Hypertension Home meds:  Norvasc 10 mg daily Stable Long-term BP goal  normotensive   Hyperlipidemia Home meds:  Atorvastatin 20 mg daily, resumed in hospital LDL 60, goal < 70 High intensity statin not needed as LDL below goal Continue statin at discharge   Other Stroke Risk Factors Obesity, Body mass index is 30.52 kg/m., BMI >/= 30 associated with increased stroke risk, recommend weight loss, diet and exercise as appropriate    Other Active Problems Asthma - Patient taking no medications for this at home  DISCHARGE EXAM Blood pressure 134/87, pulse 63, temperature 98.6 F (37 C), temperature source Oral, resp. rate 16, height 6' (1.829 m), weight 104.3 kg, SpO2 97 %.  Temp:  [97.4 F (36.3 C)-98.6 F (37 C)] 98.6 F (37 C) (11/04 1723) Pulse Rate:  [51-65] 63 (11/04 1723) Resp:  [11-24] 16 (11/04 1723) BP: (102-155)/(61-98) 134/87 (11/04 1723) SpO2:  [95 %-100 %] 97 % (11/04 1723) Weight:  [104.3 kg] 104.3 kg (11/04 1230)  General - Well nourished, well developed, in no apparent distress.  Ophthalmologic - fundi not visualized due to noncooperation.  Cardiovascular - Regular rhythm and rate.  Mental Status -  Level of arousal and orientation to time, place, and person were intact. Language including expression, naming, repetition, comprehension was assessed and found intact. Fund of Knowledge was assessed and was intact.  Cranial Nerves II - XII - II - Visual field intact OU. III, IV, VI - Extraocular movements intact. V - Facial sensation intact bilaterally. VII - Facial movement intact bilaterally. VIII - Hearing & vestibular intact bilaterally. X - Palate elevates symmetrically. XI - Chin turning & shoulder shrug intact bilaterally. XII - Tongue protrusion intact.  Motor Strength - The patient's strength was normal in all extremities and pronator drift was absent.  Bulk was normal and fasciculations were  absent.   Motor Tone - Muscle tone was assessed at the neck and appendages and was normal.  Reflexes - The patient's reflexes were symmetrical in all extremities and he had no pathological reflexes.  Sensory - Light touch, temperature/pinprick were assessed and were symmetrical.    Coordination - The patient had normal movements in the hands and feet with no ataxia or dysmetria.  Tremor was absent.  Gait and Station - deferred.   Discharge Diet       Diet   Diet Heart Room service appropriate? Yes; Fluid consistency: Thin   liquids  DISCHARGE PLAN Disposition:  home aspirin 81 mg daily and clopidogrel 75 mg daily for secondary stroke prevention for 3 weeks then ASA alone. Ongoing stroke risk factor control by Primary Care Physician at time of discharge Follow-up PCP Guadlupe Spanish, MD in 2 weeks. Follow-up in [name] Dr. Leonie Man in 4-6 weeks, office to schedule an appointment.  Follow up with Dr. Oval Linsey cardiology as scheduled  DC instructions TAKE ASA 81MG  AND PLAVIX 75MG  DAILY FOR 3 WEEKS AND THEN ASA DAILY ALONE YOU WILL SEE DR. Leonie Man AT [facility] FOR NEUROLOGY FOLLOW UP IN 4-6 WEEKS YOU WILL CONTINUE TO FOLLOW UP WITH YOUR CARDIOLOGIST DR. [facility] WILL CONTINUE MONITOR YOUR LOOP RECORDER AND CHECK ON YOUR WOUND DR [place] WILL LET YOU KNOW IF YOUR "HOLE IN THE HEART" NEEDS TO BE CLOSED. IF ANY RECURRENT STROKE SYMPTOMS, PLEASE CALL 911 OR GO TO NEAREST ED FOR EVALUATION  40 minutes were spent preparing discharge.  Rosalin Hawking, MD PhD Stroke Neurology 10/28/2021 6:07 PM

## 2021-10-28 NOTE — Anesthesia Preprocedure Evaluation (Addendum)
Anesthesia Evaluation  Patient identified by MRN, date of birth, ID band Patient awake    Reviewed: Allergy & Precautions, NPO status , Patient's Chart, lab work & pertinent test results  Airway Mallampati: II  TM Distance: >3 FB Neck ROM: Full    Dental  (+) Teeth Intact, Dental Advisory Given   Pulmonary asthma ,    Pulmonary exam normal breath sounds clear to auscultation       Cardiovascular hypertension, Pt. on medications Normal cardiovascular exam Rhythm:Regular Rate:Normal  TTE 2022 1. Left ventricular ejection fraction, by estimation, is 55 to 60%. The  left ventricle has normal function. The left ventricle has no regional  wall motion abnormalities. There is mild left ventricular hypertrophy.  Left ventricular diastolic parameters  are consistent with Grade I diastolic dysfunction (impaired relaxation).  2. Right ventricular systolic function is normal. The right ventricular  size is mildly enlarged. Tricuspid regurgitation signal is inadequate for  assessing PA pressure.  3. Right atrial size was mildly dilated.  4. The mitral valve is normal in structure. No evidence of mitral valve  regurgitation. No evidence of mitral stenosis.  5. The aortic valve is tricuspid. Aortic valve regurgitation is not  visualized. No aortic stenosis is present.  6. The inferior vena cava is normal in size with greater than 50%  respiratory variability, suggesting right atrial pressure of 3 mmHg.    Neuro/Psych TIACVA, No Residual Symptoms negative psych ROS   GI/Hepatic negative GI ROS, Neg liver ROS,   Endo/Other  negative endocrine ROS  Renal/GU negative Renal ROS  negative genitourinary   Musculoskeletal negative musculoskeletal ROS (+)   Abdominal   Peds  Hematology negative hematology ROS (+)   Anesthesia Other Findings   Reproductive/Obstetrics                            Anesthesia  Physical Anesthesia Plan  ASA: 3  Anesthesia Plan: MAC   Post-op Pain Management:    Induction: Intravenous  PONV Risk Score and Plan: Propofol infusion and Treatment may vary due to age or medical condition  Airway Management Planned: Natural Airway  Additional Equipment:   Intra-op Plan:   Post-operative Plan:   Informed Consent: I have reviewed the patients History and Physical, chart, labs and discussed the procedure including the risks, benefits and alternatives for the proposed anesthesia with the patient or authorized representative who has indicated his/her understanding and acceptance.     Dental advisory given  Plan Discussed with: CRNA  Anesthesia Plan Comments:         Anesthesia Quick Evaluation

## 2021-10-28 NOTE — Interval H&P Note (Signed)
History and Physical Interval Note:  10/28/2021 1:13 PM  Scott Brandt  has presented today for surgery, with the diagnosis of STROKE.  The various methods of treatment have been discussed with the patient and family. After consideration of risks, benefits and other options for treatment, the patient has consented to  Procedure(s): TRANSESOPHAGEAL ECHOCARDIOGRAM (TEE) (N/A) as a surgical intervention.  The patient's history has been reviewed, patient examined, no change in status, stable for surgery.  I have reviewed the patient's chart and labs.  Questions were answered to the patient's satisfaction.     Chilton Si, MD

## 2021-10-29 LAB — BETA-2-GLYCOPROTEIN I ABS, IGG/M/A
Beta-2 Glyco I IgG: <9 GPI IgG units (ref 0–20)
Beta-2-Glycoprotein I IgA: <9 GPI IgA units (ref 0–25)
Beta-2-Glycoprotein I IgM: <9 GPI IgM units (ref 0–32)

## 2021-10-30 ENCOUNTER — Encounter (HOSPITAL_COMMUNITY): Payer: Self-pay | Admitting: Cardiovascular Disease

## 2021-10-31 ENCOUNTER — Encounter (HOSPITAL_COMMUNITY): Payer: Self-pay | Admitting: Cardiology

## 2021-10-31 ENCOUNTER — Telehealth: Payer: Self-pay | Admitting: Cardiovascular Disease

## 2021-10-31 ENCOUNTER — Telehealth: Payer: Self-pay | Admitting: Neurology

## 2021-10-31 LAB — CARDIOLIPIN ANTIBODIES, IGG, IGM, IGA
Anticardiolipin IgA: <9 APL U/mL (ref 0–11)
Anticardiolipin IgG: <9 GPL U/mL (ref 0–14)
Anticardiolipin IgM: <9 MPL U/mL (ref 0–12)

## 2021-10-31 NOTE — Telephone Encounter (Signed)
Spoke with patient and reviewed the discharge summary which he did not fully understand He thought he was not to take Amlodipine but started back on it yesterday  His blood pressure has been running 145-150's/90's since home from hospital  Did move patients appointment to next week  Patient aware of date, time, and location

## 2021-10-31 NOTE — Telephone Encounter (Signed)
Pt c/o medication issue:  1. Name of Medication: amLODipine (NORVASC) 10 MG tablet  2. How are you currently taking this medication (dosage and times per day)?  TAKE 1 TABLET DAILYPatient taking differently: Take 10 mg by mouth daily.   3. Are you having a reaction (difficulty breathing--STAT)? no  4. What is your medication issue? Pt states that his neurologist took him off the medication to keep pts blood pressure up.. pt has restarted taking the medication and would like to know if this is ok... please advise

## 2021-10-31 NOTE — Telephone Encounter (Signed)
Patient called to schedule an appt for hospital f/u with Dr. Pearlean Brownie. Patient had several questions he would like to be discussed before his appt. I advised most questions would be answered in his appointment and he insisted that someone talk to him about his CT scan he had done in the hospital on 10/26/21 before his appointment on 12/08/21. Patient also asked if he should resume all medications that he was taking prior to his hospital stay. I advised to refer to his discharge packet for advice on medications as they should have stated that on there and to f/u with PCP within 1-2 weeks as they will help answer some questions he may have as well. Patient insisted someone here give him a call back to discuss this.

## 2021-10-31 NOTE — Telephone Encounter (Signed)
Spoke to pt  Reviewed Discharged summary with pt,  TAKE ASA 81MG  AND PLAVIX 75MG  DAILY FOR 3 WEEKS AND THEN ASA DAILY ALONE YOU WILL SEE DR. AT [facility] FOR NEUROLOGY FOLLOW UP IN 4-6 WEEKS YOU WILL CONTINUE TO FOLLOW UP WITH YOUR CARDIOLOGIST DR. [place] [facility] [facility] WILL CONTINUE MONITOR YOUR LOOP RECORDER AND CHECK ON YOUR WOUND DR [place] WILL LET YOU KNOW IF YOUR "HOLE IN THE HEART" NEEDS TO BE CLOSED. IF ANY RECURRENT STROKE SYMPTOMS, PLEASE CALL 911 OR GO TO NEAREST ED FOR EVALUATION  Advised pt to call Dr is regard to his BP meds Pt voiced understanding

## 2021-11-01 ENCOUNTER — Other Ambulatory Visit: Payer: Self-pay | Admitting: *Deleted

## 2021-11-01 LAB — PHOSPHATIDYLSERINE ANTIBODIES
Phosphatydalserine, IgA: <1 APS Units (ref 0–19)
Phosphatydalserine, IgG: 9 Units (ref 0–30)
Phosphatydalserine, IgM: 34 Units — ABNORMAL HIGH (ref 0–30)

## 2021-11-01 NOTE — Telephone Encounter (Signed)
He should still be on amlodipine.     TCR   Patient has started back on Amlodipine

## 2021-11-01 NOTE — Patient Outreach (Signed)
[facility]) Care Management  11/01/2021  Scott Brandt [id number] [id number]   EMMI Red Flag Alert   Referral received: 11/01/21 Day: #1 Date : 10/31/21 Red Flag Reason: Who reached Patient   Questions/problems with meds? Yes   Know how/when to take meds? No   Problems setting up rehab? Didn't Need Any    Insurance: [employer]  Subjective: Successful outreach call to patient , explained reason for the call, EMMI stroke transition calls.  Patient verbalized understanding of receiving call, red flag alert regarding question on medications. Reviewed discharge medication list with patient, he reports that he already called his cardiology office to verify medication specific question on Amlodipine, he states that he is straight on medications to take .  Patient denies new stroke symptoms, reviewed signs symptoms of stoke and teachback on action plan. Patient states that he is doing well, reports having office visit with PCP on today.  No ongoing questions, education or resource needs. Reminded patient of additional stroke EMMI transition calls/emails.    Plan Will close case to [facility] care management on ongoing care management needs identified.    Egbert Garibaldi, RN, BSN  [facility] Care Management,Care Management Coordinator  [phone]- Mobile [phone]- Toll Free Main Office

## 2021-11-04 ENCOUNTER — Encounter (HOSPITAL_BASED_OUTPATIENT_CLINIC_OR_DEPARTMENT_OTHER): Payer: Self-pay

## 2021-11-04 ENCOUNTER — Telehealth (HOSPITAL_BASED_OUTPATIENT_CLINIC_OR_DEPARTMENT_OTHER): Payer: Self-pay | Admitting: Cardiovascular Disease

## 2021-11-04 ENCOUNTER — Telehealth: Payer: Self-pay | Admitting: Neurology

## 2021-11-04 NOTE — Telephone Encounter (Signed)
Patients states he was returning a phone call

## 2021-11-04 NOTE — Telephone Encounter (Signed)
Responded to [employer] patient sent Will discuss returning to work date when Dr Duke Salvia in office next week

## 2021-11-04 NOTE — Telephone Encounter (Signed)
Please advise 

## 2021-11-04 NOTE — Telephone Encounter (Signed)
Spoke to pt. He state he had a missed call from our office and unsure if it was related to his work note request.  Will forward to nurse.

## 2021-11-04 NOTE — Telephone Encounter (Signed)
Pt called, just got out of the hospital last week. Pt is wanting to know what are his work restrictions. Pt requesting a call back.

## 2021-11-07 NOTE — Telephone Encounter (Signed)
I called the patient. Note in Epic.

## 2021-11-07 NOTE — Telephone Encounter (Signed)
Discussed with Dr Duke Salvia and ok for patient to return to work but will discuss travel at follow up visit  Advised patient, verbalized understanding

## 2021-11-07 NOTE — Telephone Encounter (Signed)
I called the patient. Notes from ED states he felt his speech returned to baseline. However, he has noticed it being a problem since he has returned to work this week. Also, he travels some for his employer and would like a letter to prohibit this for now. He saw his PCP last week and will call to discuss these needs with that office. He has new patient appt with our office on 12/08/21.

## 2021-11-08 NOTE — Telephone Encounter (Addendum)
The patient called back again today. He has multiple post-stroke concerns that he would like to discuss with Dr. Pearlean Brownie in person. Feels he is doing well overall. Still having residual tingling in left leg and left hand, intermittently. Says the left leg is not new (hx of femur fracture w/ rod in place). He would like to be seen a little sooner. He was placed in an open slot on 11/10/21.

## 2021-11-09 ENCOUNTER — Ambulatory Visit (INDEPENDENT_AMBULATORY_CARE_PROVIDER_SITE_OTHER): Payer: BC Managed Care – PPO | Admitting: Cardiovascular Disease

## 2021-11-09 ENCOUNTER — Encounter (HOSPITAL_BASED_OUTPATIENT_CLINIC_OR_DEPARTMENT_OTHER): Payer: Self-pay | Admitting: Cardiovascular Disease

## 2021-11-09 ENCOUNTER — Other Ambulatory Visit: Payer: Self-pay

## 2021-11-09 VITALS — BP 120/86 | HR 63 | Ht 72.0 in | Wt 231.8 lb

## 2021-11-09 DIAGNOSIS — E7800 Pure hypercholesterolemia, unspecified: Secondary | ICD-10-CM | POA: Diagnosis not present

## 2021-11-09 DIAGNOSIS — Q2112 Patent foramen ovale: Secondary | ICD-10-CM | POA: Diagnosis not present

## 2021-11-09 DIAGNOSIS — I639 Cerebral infarction, unspecified: Secondary | ICD-10-CM | POA: Diagnosis not present

## 2021-11-09 DIAGNOSIS — I10 Essential (primary) hypertension: Secondary | ICD-10-CM

## 2021-11-09 NOTE — Progress Notes (Signed)
Cardiology Office Note   Date:  11/09/2021   ID:  Scott Brandt, DOB 1961/09/05, MRN [id number]  PCP:  Guadlupe Spanish, MD  Cardiologist:  [name], MD  Electrophysiologist:  None   Evaluation Performed:  Follow-Up Visit  Chief Complaint:  hypertension  History of Present Illness:    Scott Brandt is a 60 y.o. male with hyperlipidemia, asthma, anxiety and TIA (2007) who presents for follow up.  He was initially seen 09/2017 to establish care.  He reported palpitations only occur in the setting of stressful situations.  The palpitations subside after the stressful stimulus is over.  He exercises 4 times per week and has no exertional symptoms.  No further testing was performed.  He was very hypertensive in clinic.  It was unclear if this was due to anxiety.  He was started on amlodipine 2.5 mg daily and asked to check his blood pressure at home.  He also had a coronary calcium score that showed one area of calcium in the left anterior descending.  His calcium score was 4, which is 44th percentile for age and gender.  It was recommended that he start taking his atorvastatin daily.   Scott Brandt stopped his amlodipine and was trying to control his BP with diet and exercise.  However his blood pressure has been quite elevated.  At his last appointment amlodipine was added at 5 mg daily.  His blood pressure continues to be elevated so amlodipine was increased to 10 mg.  He had an echocardiogram 02/11/2020 that revealed LVEF 55 to 60% with grade 1 diastolic dysfunction.  He had moderate LVH.  The echo was otherwise unremarkable.  He had a repeat echo 07/2020 that revealed LVEF 55 to 60% with grade 1 diastolic dysfunction and mild LVH.  Scott Brandt blood pressure remained a little elevated.  He wanted to work on diet and exercise prior to putting hydrochlorothiazide. He was admitted 10/2021 with a stroke. He presented with expressive aphasia and a right facial droop. He received TNK with near  resolution of symptoms He had a TEE with LVEF 60-65% and a small PFO. A loop recorder was inserted. Aspirin was switched to clopidogrel. Today, he is accompanied by his wife. Since his hospital admittance, he has noticed a "tingling numbness" in his left LE and hand, as well as in his head. However, he states he often has left LE stiffness and numbness due to a previous left LE fracture. Occasionally when he walks, he experiences a numbness in his fingers. This usually dissipates with moving them and resting. Overall, he has noticed improvement concerning his tingling sensations since taking his medication at 5 PM with food instead of around 10 AM. Earlier this summer he had swelling in his ankles, but this has resolved at this time. He monitors his blood pressure off and on, but notes his cuff may be inaccurate. Recently his reading was about 20 pts higher than during a clinic visit. He often exercises with push ups and sit ups, but not with his usual walking. He denies any palpitations, chest pain, or shortness of breath. No lightheadedness, headaches, syncope, orthopnea, or PND.   Past Medical History:  Diagnosis Date   Asthma    signs of asthma from Dr Melvyn Novas    Asthma    Essential hypertension 02/24/2020   Hypercholesteremia    Left ventricular hypertrophy 02/24/2020   Lightheadedness 12/15/2020   TIA (transient ischemic attack) 2007   Urticaria    Past  Surgical History:  Procedure Laterality Date   BUBBLE STUDY  10/28/2021   Procedure: BUBBLE STUDY;  Surgeon: [name], MD;  Location: [facility];  Service: Cardiovascular;;   FEMUR IM NAIL Left 02/28/2016   Procedure: INTRAMEDULLARY (IM) NAIL FEMORAL;  Surgeon: [name], MD;  Location: [facility];  Service: Orthopedics;  Laterality: Left;   LOOP RECORDER INSERTION N/A 10/28/2021   Procedure: LOOP RECORDER INSERTION;  Surgeon: [name], MD;  Location: [facility];  Service: Cardiovascular;  Laterality: N/A;   TEE WITHOUT  CARDIOVERSION N/A 10/28/2021   Procedure: TRANSESOPHAGEAL ECHOCARDIOGRAM (TEE);  Surgeon: [name], MD;  Location: [facility];  Service: Cardiovascular;  Laterality: N/A;     Current Meds  Medication Sig   ALPRAZolam (XANAX) 0.5 MG tablet Take 0.5 mg by mouth at bedtime as needed for anxiety.   amLODipine (NORVASC) 10 MG tablet TAKE 1 TABLET DAILY (Patient taking differently: Take 10 mg by mouth daily.)   aspirin EC 81 MG EC tablet Take 1 tablet (81 mg total) by mouth daily. Swallow whole.   atorvastatin (LIPITOR) 20 MG tablet Take 20 mg by mouth daily.   brimonidine (ALPHAGAN) 0.2 % ophthalmic solution Place 1 drop into both eyes 2 (two) times daily.   clopidogrel (PLAVIX) 75 MG tablet Take 1 tablet (75 mg total) by mouth daily.   fexofenadine (ALLEGRA) 180 MG tablet Take 180 mg by mouth daily as needed for allergies.   latanoprost (XALATAN) 0.005 % ophthalmic solution Place 1 drop into both eyes at bedtime.     Allergies:   Shellfish allergy   Social History   Tobacco Use   Smoking status: Never   Smokeless tobacco: Never  Vaping Use   Vaping Use: Never used  Substance Use Topics   Alcohol use: Yes    Alcohol/week: 0.0 standard drinks    Comment: social   Drug use: No     Family Hx: The patient's family history includes Allergic rhinitis in his father; Cancer in his sister; Diabetes in his sister; Hyperlipidemia in his brother and mother; Hypertension in his brother, mother, and sister; Rheum arthritis in his sister; Stroke in his maternal grandmother. There is no history of Angioedema, Asthma, Atopy, Eczema, Immunodeficiency, or Urticaria.  ROS:   Please see the history of present illness.    (+) Tingling sensations/Numbness in head, left hand, and bilateral fingers (+) Left LE stiffness, tingling/numbness All other systems reviewed and are negative.   Prior CV studies:   The following studies were reviewed today:  Loop Recorder Insertion  10/28/2021: CONCLUSIONS:   1. Successful implantation of a [employer] Reveal LINQ implantable loop recorder for cryptogenic stroke  2. No early apparent complications.   LE Venous DVT 10/28/2021: Summary:  BILATERAL:  - No evidence of deep vein thrombosis seen in the lower extremities,  bilaterally.  -No evidence of popliteal cyst, bilaterally.  Echo TEE 10/28/2021:  1. Left ventricular ejection fraction, by estimation, is 60 to 65%. The  left ventricle has normal function. The left ventricle has no regional  wall motion abnormalities.   2. Right ventricular systolic function is normal. The right ventricular  size is normal.   3. No left atrial/left atrial appendage thrombus was detected.   4. The mitral valve is normal in structure. Trivial mitral valve  regurgitation. No evidence of mitral stenosis.   5. The aortic valve is tricuspid. Aortic valve regurgitation is not  visualized. No aortic stenosis is present.   6. The inferior vena  cava is normal in size with greater than 50%  respiratory variability, suggesting right atrial pressure of 3 mmHg.   7. Evidence of atrial level shunting detected by color flow Doppler.  Agitated saline contrast bubble study was positive with shunting observed  within 3-6 cardiac cycles suggestive of interatrial shunt. There is a  small patent foramen ovale with  bidirectional shunting across atrial septum.   Conclusion(s)/Recommendation(s): Normal biventricular function without  evidence of hemodynamically significant valvular heart disease. Findings  are concerning for an interatrial shunt as detailed above.  Echo Bubble Study 10/27/2021:  1. Left ventricular ejection fraction, by estimation, is 55%. The left  ventricle has normal function. The left ventricle has no regional wall  motion abnormalities. There is mild left ventricular hypertrophy. Left  ventricular diastolic parameters are  consistent with Grade I diastolic dysfunction (impaired  relaxation).   2. Right ventricular systolic function is normal. The right ventricular  size is normal. Tricuspid regurgitation signal is inadequate for assessing  PA pressure.   3. The mitral valve is normal in structure. Trivial mitral valve  regurgitation. No evidence of mitral stenosis.   4. The aortic valve is tricuspid. Aortic valve regurgitation is not  visualized. No aortic stenosis is present.   5. The inferior vena cava is normal in size with greater than 50%  respiratory variability, suggesting right atrial pressure of 3 mmHg.   6. Positive bubble study, possible PFO.   CTA Head/Neck 10/26/2021: FINDINGS: CTA NECK FINDINGS   Aortic arch: Aortic atherosclerosis.  Branching pattern is normal.   Right carotid system: Common carotid artery widely patent to the bifurcation. Carotid bifurcation is normal. Cervical ICA is normal.   Left carotid system: Common carotid artery widely patent to the bifurcation. Carotid bifurcation is normal. Cervical ICA is normal.   Vertebral arteries: Both vertebral artery origins are widely patent. Both vertebral arteries are patent through the cervical region to the foramen magnum.   Skeleton: Ordinary spondylosis.   Other neck: No mass or lymphadenopathy.   Upper chest: Lung apices are clear.   Review of the MIP images confirms the above findings   CTA HEAD FINDINGS   Anterior circulation: Both internal carotid arteries widely patent through the skull base and siphon regions. Minimal siphon atherosclerotic calcification but no stenosis. The anterior and middle cerebral vessels are patent. No large vessel occlusion. No proximal stenosis. No aneurysm or vascular malformation.   Posterior circulation: Both vertebral arteries are patent to the basilar. No basilar stenosis. Posterior circulation branch vessels are normal. Patent posterior communicating arteries.   Venous sinuses: Patent and normal.   Anatomic variants: None  significant.   Review of the MIP images confirms the above findings   IMPRESSION: Normal CT angiography of the head and neck. No large vessel occlusion. No proximal stenosis. No carotid bifurcation disease.  Echo 07/2020: 1. Left ventricular ejection fraction, by estimation, is 55 to 60%. The  left ventricle has normal function. The left ventricle has no regional  wall motion abnormalities. There is mild left ventricular hypertrophy.  Left ventricular diastolic parameters  are consistent with Grade I diastolic dysfunction (impaired relaxation).   2. Right ventricular systolic function is normal. The right ventricular  size is mildly enlarged. Tricuspid regurgitation signal is inadequate for  assessing PA pressure.   3. Right atrial size was mildly dilated.   4. The mitral valve is normal in structure. No evidence of mitral valve  regurgitation. No evidence of mitral stenosis.   5. The aortic  valve is tricuspid. Aortic valve regurgitation is not  visualized. No aortic stenosis is present.   6. The inferior vena cava is normal in size with greater than 50%  respiratory variability, suggesting right atrial pressure of 3 mmHg.   Echo 02/11/20:  1. Left ventricular ejection fraction, by estimation, is 55 to 60%. The  left ventricle has normal function. The left ventricle has no regional  wall motion abnormalities. There is moderate concentric left ventricular  hypertrophy. Left ventricular  diastolic parameters are consistent with Grade I diastolic dysfunction  (impaired relaxation). Elevated left atrial pressure.   2. Right ventricular systolic function is normal. The right ventricular  size is normal. There is normal pulmonary artery systolic pressure. The  estimated right ventricular systolic pressure is 19.8 mmHg.   3. Left atrial size was moderately dilated.   4. The mitral valve is normal in structure and function. Trivial mitral  valve regurgitation. No evidence of mitral  stenosis.   5. The aortic valve is normal in structure and function. Aortic valve  regurgitation is not visualized. Mild aortic valve sclerosis is present,  with no evidence of aortic valve stenosis.   6. The inferior vena cava is normal in size with greater than 50%  respiratory variability, suggesting right atrial pressure of 3 mmHg.    Labs/Other Tests and Data Reviewed:    EKG:   11/09/2021: Sinus rhythm. Rate 63 bpm. IVCD. 08/20/2020: Sinus bradycardia. Rate 55 bpm. Nonspecific T wave abnormalities.  Recent Labs: 10/26/2021: ALT 26 10/27/2021: TSH 3.457 10/28/2021: BUN 7; Creatinine, Ser 1.23; Hemoglobin 12.7; Platelets 267; Potassium 4.1; Sodium 136   Recent Lipid Panel Lab Results  Component Value Date/Time   CHOL 120 10/27/2021 03:42 AM   CHOL 169 08/20/2020 09:30 AM   TRIG 38 10/27/2021 03:42 AM   HDL 52 10/27/2021 03:42 AM   HDL 76 08/20/2020 09:30 AM   CHOLHDL 2.3 10/27/2021 03:42 AM   LDLCALC 60 10/27/2021 03:42 AM   LDLCALC 79 08/20/2020 09:30 AM    Wt Readings from Last 3 Encounters:  11/09/21 231 lb 12.8 oz (105.1 kg)  10/28/21 230 lb (104.3 kg)  12/15/20 229 lb (103.9 kg)     Objective:    VS:  BP 120/86 (BP Location: Left Arm, Patient Position: Sitting, Cuff Size: Large)   Pulse 63   Ht 6' (1.829 m)   Wt 231 lb 12.8 oz (105.1 kg)   BMI 31.44 kg/m  , BMI Body mass index is 31.44 kg/m. GENERAL:  Well appearing HEENT: Pupils equal round and reactive, fundi not visualized, oral mucosa unremarkable NECK:  No jugular venous distention, waveform within normal limits, carotid upstroke brisk and symmetric, no bruits, no thyromegaly LYMPHATICS:  No cervical adenopathy HEART:  RRR.  PMI not displaced or sustained,S1 and S2 within normal limits, no S3, no S4, no clicks, no rubs, no murmurs ABD:  Flat, positive bowel sounds normal in frequency in pitch, no bruits, no rebound, no guarding, no midline pulsatile mass, no hepatomegaly, no splenomegaly EXT:  2 plus  pulses throughout, no edema, no cyanosis no clubbing SKIN:  No rashes no nodules NEURO:  Cranial nerves II through XII grossly intact, motor grossly intact throughout PSYCH:  Cognitively intact, oriented to person place and time   ASSESSMENT & PLAN:    Essential hypertension BP stable.  It is lower at home than in the office.  Goal <130/80.  Continue amlodipine.  Stroke [facility]) Recently discharged after idiopathic stroke.  He has a PFO  and this is his second event.  He was on aspirin daily prior to this event.  He will see Dr. Leonie Brandt tomorrow to discuss his long term antiplatelet strategy.  Continue aspirin and Plavix for now.  Loop monitor in place.  He is eager to talk to the Structural Heart Team about possible PFO closure.  We will arrange for him to meet with them, understanding that neurology recommended waiting 2-3 months before doing the procedure.   Pure hypercholesterolemia Continue atorvastatin.  LDL goal <70.    Medication Adjustments/Labs and Tests Ordered: Current medicines are reviewed at length with the patient today.  Concerns regarding medicines are outlined above.   Tests Ordered: Orders Placed This Encounter  Procedures   Ambulatory referral to Structural Heart/Valve Clinic (only at [place])   EKG 12-Lead    Medication Changes: No orders of the defined types were placed in this encounter.   Disposition: FU with Scott Ekdahl C. Oval Linsey, MD, [facility] in 6 months.  I,Mathew Stumpf,acting as a Education administrator for [name], MD.,have documented all relevant documentation on the behalf of [name], MD,as directed by  [name], MD while in the presence of [name], MD.  I, Amistad Oval Linsey, MD have reviewed all documentation for this visit.  The documentation of the exam, diagnosis, procedures, and orders on 11/09/2021 are all accurate and complete.  Time spent: 45 minutes-Greater than 50% of this time was spent in counseling, explanation of  diagnosis, planning of further management, and coordination of care.   Signed, [name], MD  11/09/2021 6:04 PM    [place]

## 2021-11-09 NOTE — Assessment & Plan Note (Signed)
Recently discharged after idiopathic stroke.  He has a PFO and this is his second event.  He was on aspirin daily prior to this event.  He will see Dr. Pearlean Brownie tomorrow to discuss his long term antiplatelet strategy.  Continue aspirin and Plavix for now.  Loop monitor in place.  He is eager to talk to the Structural Heart Team about possible PFO closure.  We will arrange for him to meet with them, understanding that neurology recommended waiting 2-3 months before doing the procedure.

## 2021-11-09 NOTE — Assessment & Plan Note (Signed)
Continue atorvastatin.  LDL goal <70. 

## 2021-11-09 NOTE — Telephone Encounter (Signed)
See phone note 11/11

## 2021-11-09 NOTE — Patient Instructions (Signed)
Medication Instructions:  Your physician recommends that you continue on your current medications as directed. Please refer to the Current Medication list given to you today.   *If you need a refill on your cardiac medications before your next appointment, please call your pharmacy*  Lab Work: NONE   Testing/Procedures: NONE   Follow-Up: At [facility], you and your health needs are our priority.  As part of our continuing mission to provide you with exceptional heart care, we have created designated Provider Care Teams.  These Care Teams include your primary Cardiologist (physician) and Advanced Practice Providers (APPs -  Physician Assistants and Nurse Practitioners) who all work together to provide you with the care you need, when you need it.  We recommend signing up for the patient portal called "MyChart".  Sign up information is provided on this After Visit Summary.  MyChart is used to connect with patients for Virtual Visits (Telemedicine).  Patients are able to view lab/test results, encounter notes, upcoming appointments, etc.  Non-urgent messages can be sent to your provider as well.   To learn more about what you can do with MyChart, go to [email or web address].    Your next appointment:   6 MONTHS   The format for your next appointment:   In Person  Provider:   Chilton Si, MD   You have been referred to STRUCTURAL HEART

## 2021-11-09 NOTE — Assessment & Plan Note (Signed)
BP stable.  It is lower at home than in the office.  Goal <130/80.  Continue amlodipine.

## 2021-11-10 ENCOUNTER — Encounter: Payer: Self-pay | Admitting: Neurology

## 2021-11-10 ENCOUNTER — Ambulatory Visit (INDEPENDENT_AMBULATORY_CARE_PROVIDER_SITE_OTHER): Payer: BC Managed Care – PPO | Admitting: Neurology

## 2021-11-10 ENCOUNTER — Ambulatory Visit: Payer: BC Managed Care – PPO

## 2021-11-10 VITALS — BP 122/79 | Ht 72.0 in | Wt 232.0 lb

## 2021-11-10 DIAGNOSIS — Q2112 Patent foramen ovale: Secondary | ICD-10-CM

## 2021-11-10 DIAGNOSIS — I10 Essential (primary) hypertension: Secondary | ICD-10-CM | POA: Diagnosis not present

## 2021-11-10 DIAGNOSIS — E663 Overweight: Secondary | ICD-10-CM

## 2021-11-10 DIAGNOSIS — I639 Cerebral infarction, unspecified: Secondary | ICD-10-CM | POA: Diagnosis not present

## 2021-11-10 NOTE — Progress Notes (Signed)
[name] [address] [place]. [place] [address] [phone]       OFFICE CONSULT NOTE  Mr. Scott PUGLIA Date of Birth:  1961-10-24 Medical Record Number:  [id number]   Referring MD:  Marvel Plan  Reason for Referral:  Stroke and PFO  HPI: Scott Brandt is a pleasant 60 year old African-American male seen today for initial office consultation visit for stroke.  History is obtained from patient and his wife is accompanying him today as well as review of electronic medical records and I personally reviewed pertinent available imaging films in PACS.  He has past medical history of hypertension, hyperlipidemia, and asthma.  He presented on 10/26/2021 for sudden onset of expressive aphasia with speech difficulties and right facial droop.  He had earlier been on a Zoom call all morning and was fine.  He blew some leaves from his driveway and subsequently had lunch and went upstairs for another meeting.  He is started not feeling well around 1:00 and then came down to his wife and while trying to speak he could not get his words together.  Wife noticed right lower facial droop.  Patient his wife to him to the emergency room.  A code stroke was activated and he was seen by telestroke and found to have NIH stroke scale of 3 with expressive aphasia, dysarthria and right facial droop.  He was given IV TNK and transferred to [facility] where his symptoms subsequently resolved.  MRI scan of the brain showed tiny scattered left frontal cortical infarcts.  2D echo showed normal ejection fraction and wall bubble study was positive for PFO.  Subsequently TEE was obtained which also confirmed a small PFO but no atrial septal aneurysm clot was noted.  LDL cholesterol 60 mg percent and hemoglobin A1c was 6.2.  Hypercoagulable lab work including anticardiolipin antibodies, beta-2 glycoprotein's, lupus anticoagulant and homocystine were all normal.  Patient was started on dual antiplatelet therapy  aspirin Plavix for 3 weeks followed by aspirin alone.  Patient states is done well since discharge.  He has had no recurrent speech difficulties or any other TIA or stroke symptoms.  He saw cardiologist Dr. Duke Salvia recently was referred him to Dr. Excell Seltzer for PFO closure.  Patient states he had a somewhat similar episode in 2007 when he had expressive aphasia episode.  He was treated at the [facility] in [place] with IV tPA and with full resolution of his symptoms.  Those records are not available for my review but patient was told it was a TIA so I presume the MRI did not show definite stroke.  Patient states subsequently had a polysomnogram done as an outpatient and was not found to have sleep apnea.  Patient is back to work without restrictions.  He has no complaints today.  He has been reading about PFO and has a lot of questions about whether he is a candidate for PFO closure or not.  He denies any history of deep vein thrombosis, pulmonary embolism, stroke or heart attacks at a young age in his family.  He had loop recorder inserted on 11/0/22 and so far paroxysmal A. fib has not yet been found.  He denies history of palpitations, syncope or cardiac arrhythmias ROS:   14 system review of systems is positive for speech difficulties, facial droop, tiredness and fatigue and all other systems negative  PMH:  Past Medical History:  Diagnosis Date   Asthma    signs of asthma from Dr Sherene Sires  Asthma    Essential hypertension 02/24/2020   Hypercholesteremia    Left ventricular hypertrophy 02/24/2020   Lightheadedness 12/15/2020   TIA (transient ischemic attack) 2007   Urticaria     Social History:  Social History   Socioeconomic History   Marital status: Married    Spouse name: Not on file   Number of children: Not on file   Years of education: Not on file   Highest education level: Not on file  Occupational History   Not on file  Tobacco Use   Smoking status: Never   Smokeless  tobacco: Never  Vaping Use   Vaping Use: Never used  Substance and Sexual Activity   Alcohol use: Yes    Alcohol/week: 0.0 standard drinks    Comment: social   Drug use: No   Sexual activity: Yes    Partners: Female    Birth control/protection: None  Other Topics Concern   Not on file  Social History Narrative   ** Merged History Encounter **       Social Determinants of Health   Financial Resource Strain: Not on file  Food Insecurity: Not on file  Transportation Needs: Not on file  Physical Activity: Not on file  Stress: Not on file  Social Connections: Not on file  Intimate Partner Violence: Not on file    Medications:   Current Outpatient Medications on File Prior to Visit  Medication Sig Dispense Refill   ALPRAZolam (XANAX) 0.5 MG tablet Take 0.5 mg by mouth at bedtime as needed for anxiety.     amLODipine (NORVASC) 10 MG tablet TAKE 1 TABLET DAILY (Patient taking differently: Take 10 mg by mouth daily.) 90 tablet 3   aspirin EC 81 MG EC tablet Take 1 tablet (81 mg total) by mouth daily. Swallow whole. 90 tablet 3   atorvastatin (LIPITOR) 20 MG tablet Take 20 mg by mouth daily.     brimonidine (ALPHAGAN) 0.2 % ophthalmic solution Place 1 drop into both eyes 2 (two) times daily.     clopidogrel (PLAVIX) 75 MG tablet Take 1 tablet (75 mg total) by mouth daily. 21 tablet 0   fexofenadine (ALLEGRA) 180 MG tablet Take 180 mg by mouth daily as needed for allergies.     latanoprost (XALATAN) 0.005 % ophthalmic solution Place 1 drop into both eyes at bedtime.     No current facility-administered medications on file prior to visit.    Allergies:   Allergies  Allergen Reactions   Shellfish Allergy Anaphylaxis    Physical Exam General: Mildly obese middle-aged African-American male, seated, in no evident distress Head: head normocephalic and atraumatic.   Neck: supple with no carotid or supraclavicular bruits Cardiovascular: regular rate and rhythm, no  murmurs Musculoskeletal: no deformity Skin:  no rash/petichiae Vascular:  Normal pulses all extremities  Neurologic Exam Mental Status: Awake and fully alert. Oriented to place and time. Recent and remote memory intact. Attention span, concentration and fund of knowledge appropriate. Mood and affect appropriate.  Cranial Nerves: Fundoscopic exam reveals sharp disc margins. Pupils equal, briskly reactive to light. Extraocular movements full without nystagmus. Visual fields full to confrontation. Hearing intact. Facial sensation intact. Face, tongue, palate moves normally and symmetrically.  Motor: Normal bulk and tone. Normal strength in all tested extremity muscles. Sensory.: intact to touch , pinprick , position and vibratory sensation.  Coordination: Rapid alternating movements normal in all extremities. Finger-to-nose and heel-to-shin performed accurately bilaterally. Gait and Station: Arises from chair without difficulty. Stance is normal.  Gait demonstrates normal stride length and balance . Able to heel, toe and tandem walk without difficulty.  Reflexes: 1+ and symmetric. Toes downgoing.   NIHSS 0 Modified Rankin 1   ASSESSMENT: 60 year old African-American male with expressive aphasia and right facial droop secondary to left MCA branch embolic infarct in November 2022 of cryptogenic etiology.  Vascular risk factors of hypertension, hyperlipidemia, obesity and small PFO.  He has a ROPE score of 4 (only 38% chance that this is a PFO attributable stroke )and absence of any high risk features on the TEE which makes it less likely that PFO is to blame and that he will benefit from endovascular closure     PLAN:I had a long d/w patient and his wife about his recent cryptogenic stroke, patent foramen ovale, risk for recurrent stroke/TIAs, personally independently reviewed imaging studies and stroke evaluation results and answered questions.Continue aspirin 81 mg daily and clopidogrel 75 mg  daily  for 1 more week and then aspirin alone and stop Plavix for secondary stroke prevention and maintain strict control of hypertension with blood pressure goal below 130/90, diabetes with hemoglobin A1c goal below 6.5% and lipids with LDL cholesterol goal below 70 mg/dL. I also advised the patient to eat a healthy diet with plenty of whole grains, cereals, fruits and vegetables, exercise regularly and maintain ideal body weight.  Check transcranial Doppler bubble study to characterize a PFO.  Patient has a low ROPE score of 4 indicating only 38% chance that the PFO is responsible for the stroke and does not have high risk features of large PFO size or atrial septal aneurysm.Marland Kitchen  He has upcoming appointment with Dr. Burt Knack cardiologist to discuss possibility of PFO closure.  Followup in the future with me in 3 months or call earlier if necessary. Greater than 50% time during this 60-minute prolonged consultation visit was spent on counseling and coordination of care and extensive discussion about cryptogenic stroke and PFO and discussion about role of PFO closure and answering questions and discussion with patient and wife Scott Contras, MD Note: This document was prepared with digital dictation and possible smart phrase technology. Any transcriptional errors that result from this process are unintentional.

## 2021-11-10 NOTE — Patient Instructions (Signed)
I had a long d/w patient and his wife about his recent cryptogenic stroke, patent foramen ovale, risk for recurrent stroke/TIAs, personally independently reviewed imaging studies and stroke evaluation results and answered questions.Continue aspirin 81 mg daily and clopidogrel 75 mg daily  for 1 more week and then aspirin alone and stop Plavix for secondary stroke prevention and maintain strict control of hypertension with blood pressure goal below 130/90, diabetes with hemoglobin A1c goal below 6.5% and lipids with LDL cholesterol goal below 70 mg/dL. I also advised the patient to eat a healthy diet with plenty of whole grains, cereals, fruits and vegetables, exercise regularly and maintain ideal body weight.  Check transcranial Doppler bubble study to characterize a PFO.  Patient has a low rope score of 4 indicating only 38% chance that the PFO is responsible for the stroke.  He has upcoming appointment with Dr. Excell Seltzer cardiologist to discuss possibility of PFO closure.  Followup in the future with me in 3 months or call earlier if necessary. Stroke Prevention Some medical conditions and behaviors can lead to a higher chance of having a stroke. You can help prevent a stroke by eating healthy, exercising, not smoking, and managing any medical conditions you have. Stroke is a leading cause of functional impairment. Primary prevention is particularly important because a majority of strokes are first-time events. Stroke changes the lives of not only those who experience a stroke but also their family and other caregivers. How can this condition affect me? A stroke is a medical emergency and should be treated right away. A stroke can lead to brain damage and can sometimes be life-threatening. If a person gets medical treatment right away, there is a better chance of surviving and recovering from a stroke. What can increase my risk? The following medical conditions may increase your risk of a  stroke: Cardiovascular disease. High blood pressure (hypertension). Diabetes. High cholesterol. Sickle cell disease. Blood clotting disorders (hypercoagulable state). Obesity. Sleep disorders (obstructive sleep apnea). Other risk factors include: Being older than age 49. Having a history of blood clots, stroke, or mini-stroke (transient ischemic attack, TIA). Genetic factors, such as race, ethnicity, or a family history of stroke. Smoking cigarettes or using other tobacco products. Taking birth control pills, especially if you also use tobacco. Heavy use of alcohol or drugs, especially cocaine and methamphetamine. Physical inactivity. What actions can I take to prevent this? Manage your health conditions High cholesterol levels. Eating a healthy diet is important for preventing high cholesterol. If cholesterol cannot be managed through diet alone, you may need to take medicines. Take any prescribed medicines to control your cholesterol as told by your health care provider. Hypertension. To reduce your risk of stroke, try to keep your blood pressure below 130/80. Eating a healthy diet and exercising regularly are important for controlling blood pressure. If these steps are not enough to manage your blood pressure, you may need to take medicines. Take any prescribed medicines to control hypertension as told by your health care provider. Ask your health care provider if you should monitor your blood pressure at home. Have your blood pressure checked every year, even if your blood pressure is normal. Blood pressure increases with age and some medical conditions. Diabetes. Eating a healthy diet and exercising regularly are important parts of managing your blood sugar (glucose). If your blood sugar cannot be managed through diet and exercise, you may need to take medicines. Take any prescribed medicines to control your diabetes as told by your health care  provider. Get evaluated for  obstructive sleep apnea. Talk to your health care provider about getting a sleep evaluation if you snore a lot or have excessive sleepiness. Make sure that any other medical conditions you have, such as atrial fibrillation or atherosclerosis, are managed. Nutrition Follow instructions from your health care provider about what to eat or drink to help manage your health condition. These instructions may include: Reducing your daily calorie intake. Limiting how much salt (sodium) you use to 1,500 milligrams (mg) each day. Using only healthy fats for cooking, such as olive oil, canola oil, or sunflower oil. Eating healthy foods. You can do this by: Choosing foods that are high in fiber, such as whole grains, and fresh fruits and vegetables. Eating at least 5 servings of fruits and vegetables a day. Try to fill one-half of your plate with fruits and vegetables at each meal. Choosing lean protein foods, such as lean cuts of meat, poultry without skin, fish, tofu, beans, and nuts. Eating low-fat dairy products. Avoiding foods that are high in sodium. This can help lower blood pressure. Avoiding foods that have saturated fat, trans fat, and cholesterol. This can help prevent high cholesterol. Avoiding processed and prepared foods. Counting your daily carbohydrate intake.  Lifestyle If you drink alcohol: Limit how much you have to: 0-1 drink a day for women who are not pregnant. 0-2 drinks a day for men. Know how much alcohol is in your drink. In the [place] one drink equals one 12 oz bottle of beer (345mL), one 5 oz glass of wine (154mL), or one 1 oz glass of hard liquor (49mL). Do not use any products that contain nicotine or tobacco. These products include cigarettes, chewing tobacco, and vaping devices, such as e-cigarettes. If you need help quitting, ask your health care provider. Avoid secondhand smoke. Do not use drugs. Activity  Try to stay at a healthy weight. Get at least 30 minutes of  exercise on most days, such as: Fast walking. Biking. Swimming. Medicines Take over-the-counter and prescription medicines only as told by your health care provider. Aspirin or blood thinners (antiplatelets or anticoagulants) may be recommended to reduce your risk of forming blood clots that can lead to stroke. Avoid taking birth control pills. Talk to your health care provider about the risks of taking birth control pills if: You are over 34 years old. You smoke. You get very bad headaches. You have had a blood clot. Where to find more information American Stroke Association: www.strokeassociation.org Get help right away if: You or a loved one has any symptoms of a stroke. "BE FAST" is an easy way to remember the main warning signs of a stroke: B - Balance. Signs are dizziness, sudden trouble walking, or loss of balance. E - Eyes. Signs are trouble seeing or a sudden change in vision. F - Face. Signs are sudden weakness or numbness of the face, or the face or eyelid drooping on one side. A - Arms. Signs are weakness or numbness in an arm. This happens suddenly and usually on one side of the body. S - Speech. Signs are sudden trouble speaking, slurred speech, or trouble understanding what people say. T - Time. Time to call emergency services. Write down what time symptoms started. You or a loved one has other signs of a stroke, such as: A sudden, severe headache with no known cause. Nausea or vomiting. Seizure. These symptoms may represent a serious problem that is an emergency. Do not wait to see if the  symptoms will go away. Get medical help right away. Call your local emergency services (911 in the [place] Do not drive yourself to the hospital. Summary You can help to prevent a stroke by eating healthy, exercising, not smoking, limiting alcohol intake, and managing any medical conditions you may have. Do not use any products that contain nicotine or tobacco. These include cigarettes,  chewing tobacco, and vaping devices, such as e-cigarettes. If you need help quitting, ask your health care provider. Remember "BE FAST" for warning signs of a stroke. Get help right away if you or a loved one has any of these signs. This information is not intended to replace advice given to you by your health care provider. Make sure you discuss any questions you have with your health care provider. Document Revised: 07/12/2020 Document Reviewed: 07/12/2020 Elsevier Patient Education  [place].

## 2021-11-16 ENCOUNTER — Ambulatory Visit: Payer: BC Managed Care – PPO

## 2021-11-16 ENCOUNTER — Other Ambulatory Visit: Payer: Self-pay

## 2021-11-16 DIAGNOSIS — I639 Cerebral infarction, unspecified: Secondary | ICD-10-CM

## 2021-11-16 LAB — CUP PACEART INCLINIC DEVICE CHECK
Date Time Interrogation Session: 20221123121218
Implantable Pulse Generator Implant Date: 20221104

## 2021-11-16 NOTE — Progress Notes (Signed)
ILR wound check in clinic. Steri strips removed. Wound well healed. Home monitor transmitting nightly. No episodes. Questions answered.  

## 2021-11-16 NOTE — Patient Instructions (Signed)
   After Your Loop Recorder (ILR)   Monitor implant site for redness, swelling, and drainage. Call the device clinic at [phone] if you experience these symptoms or fever/chills.  You may shower 3 days after your loop recorder implant and wash your incision with soap and water. Avoid lotions, ointments, or perfumes over your incision until it is well-healed.  You may use a hot tub or a pool after your wound check appointment if the incision is completely closed.   Your Loop Recorder is MRI compatible.  Remote monitoring is used to monitor your pacemaker from home. This monitoring is scheduled every 31 days by our office.   

## 2021-11-20 ENCOUNTER — Encounter: Payer: Self-pay | Admitting: Neurology

## 2021-11-21 ENCOUNTER — Encounter: Payer: Self-pay | Admitting: Neurology

## 2021-11-22 ENCOUNTER — Encounter (HOSPITAL_BASED_OUTPATIENT_CLINIC_OR_DEPARTMENT_OTHER): Payer: Self-pay | Admitting: Cardiovascular Disease

## 2021-11-22 NOTE — Telephone Encounter (Signed)
Please advise 

## 2021-11-25 MED ORDER — CLOPIDOGREL BISULFATE 75 MG PO TABS: 75.0000 mg | ORAL_TABLET | Freq: Every day | ORAL | 3 refills | Status: DC

## 2021-11-25 NOTE — Telephone Encounter (Signed)
Called patient to update with Dr. Leonides Sake recommendations! Got voicemail left message per approved DPR and also sent the same information in pts. My chart!

## 2021-11-28 ENCOUNTER — Ambulatory Visit (HOSPITAL_BASED_OUTPATIENT_CLINIC_OR_DEPARTMENT_OTHER): Payer: BC Managed Care – PPO | Admitting: Cardiovascular Disease

## 2021-11-30 ENCOUNTER — Institutional Professional Consult (permissible substitution): Payer: BC Managed Care – PPO | Admitting: Cardiovascular Disease

## 2021-12-02 ENCOUNTER — Ambulatory Visit (HOSPITAL_COMMUNITY)
Admission: RE | Admit: 2021-12-02 | Discharge: 2021-12-02 | Disposition: A | Discharge status: Home / Self Care | Payer: BC Managed Care – PPO | Source: Ambulatory Visit | Attending: Neurology | Admitting: Neurology

## 2021-12-02 DIAGNOSIS — Q2112 Patent foramen ovale: Secondary | ICD-10-CM | POA: Insufficient documentation

## 2021-12-02 DIAGNOSIS — I639 Cerebral infarction, unspecified: Secondary | ICD-10-CM | POA: Insufficient documentation

## 2021-12-05 ENCOUNTER — Ambulatory Visit (INDEPENDENT_AMBULATORY_CARE_PROVIDER_SITE_OTHER): Payer: BC Managed Care – PPO

## 2021-12-05 ENCOUNTER — Ambulatory Visit (INDEPENDENT_AMBULATORY_CARE_PROVIDER_SITE_OTHER): Payer: BC Managed Care – PPO | Admitting: Cardiovascular Disease

## 2021-12-05 ENCOUNTER — Encounter: Payer: Self-pay | Admitting: Cardiovascular Disease

## 2021-12-05 ENCOUNTER — Other Ambulatory Visit: Payer: Self-pay

## 2021-12-05 VITALS — BP 140/92 | HR 56 | Ht 72.0 in | Wt 236.4 lb

## 2021-12-05 DIAGNOSIS — I639 Cerebral infarction, unspecified: Secondary | ICD-10-CM | POA: Diagnosis not present

## 2021-12-05 DIAGNOSIS — Q2112 Patent foramen ovale: Secondary | ICD-10-CM | POA: Diagnosis not present

## 2021-12-05 NOTE — Patient Instructions (Signed)
Medication Instructions:  Your physician recommends that you continue on your current medications as directed. Please refer to the Current Medication list given to you today.  *If you need a refill on your cardiac medications before your next appointment, please call your pharmacy*   Lab Work: None If you have labs (blood work) drawn today and your tests are completely normal, you will receive your results only by: MyChart Message (if you have MyChart) OR A paper copy in the mail If you have any lab test that is abnormal or we need to change your treatment, we will call you to review the results.   Testing/Procedures: None   Follow-Up: At [facility], you and your health needs are our priority.  As part of our continuing mission to provide you with exceptional heart care, we have created designated Provider Care Teams.  These Care Teams include your primary Cardiologist (physician) and Advanced Practice Providers (APPs -  Physician Assistants and Nurse Practitioners) who all work together to provide you with the care you need, when you need it.  We recommend signing up for the patient portal called "MyChart".  Sign up information is provided on this After Visit Summary.  MyChart is used to connect with patients for Virtual Visits (Telemedicine).  Patients are able to view lab/test results, encounter notes, upcoming appointments, etc.  Non-urgent messages can be sent to your provider as well.   To learn more about what you can do with MyChart, go to [email or web address].    Your next appointment:   1 month(s)  The format for your next appointment:   In Person  Provider:   Carlean Jews, PA-C    Other Instructions

## 2021-12-05 NOTE — Progress Notes (Signed)
Cardiology Office Note:    Date:  12/09/2021   ID:  Scott Brandt, DOB [birth year], MRN [id number]  PCP:  Karle Plumber, MD   [facility] Providers Cardiologist:  Chilton Si, MD     Referring MD: Karle Plumber, MD   Chief Complaint  Patient presents with   PFO    History of Present Illness:    Scott Brandt is a 60 y.o. male referred by Dr. Duke Salvia for evaluation of PFO.  The patient is here with his wife today.  He initially was diagnosed with a TIA in 2007.  Stroke risk factors include a history of hypertension and hypercholesterolemia.  The patient was admitted in November 2022 with expressive aphasia and right facial droop, diagnosed with an acute ischemic stroke, and treated with TNKase with resolution of his symptoms.  There was no arrhythmia seen on telemetry in the hospital.  A loop recorder was implanted for ongoing monitoring.  Transesophageal echo demonstrated no significant valvular heart disease and normal LV function.  A PFO was demonstrated.  A CTA of the chest showed arch atherosclerosis with no large vessel occlusive disease.  The carotid arteries were widely patent with no significant disease.  The patient has undergone outpatient neurology follow-up and a transcranial Doppler study demonstrated large intracardiac right to left shunt.  From a symptomatic perspective, he is doing well with no chest pain, heart palpitations, or shortness of breath.  He is compliant with his medications.  Past Medical History:  Diagnosis Date   Asthma    signs of asthma from Dr Sherene Sires    Asthma    Essential hypertension 02/24/2020   Hypercholesteremia    Left ventricular hypertrophy 02/24/2020   Lightheadedness 12/15/2020   TIA (transient ischemic attack) 2007   Urticaria     Past Surgical History:  Procedure Laterality Date   BUBBLE STUDY  10/28/2021   Procedure: BUBBLE STUDY;  Surgeon: Chilton Si, MD;  Location: [facility] ENDOSCOPY;  Service: Cardiovascular;;   FEMUR IM  NAIL Left 02/28/2016   Procedure: INTRAMEDULLARY (IM) NAIL FEMORAL;  Surgeon: Durene Romans, MD;  Location: [facility] OR;  Service: Orthopedics;  Laterality: Left;   LOOP RECORDER INSERTION N/A 10/28/2021   Procedure: LOOP RECORDER INSERTION;  Surgeon: Regan Lemming, MD;  Location: [facility] INVASIVE CV LAB;  Service: Cardiovascular;  Laterality: N/A;   TEE WITHOUT CARDIOVERSION N/A 10/28/2021   Procedure: TRANSESOPHAGEAL ECHOCARDIOGRAM (TEE);  Surgeon: Chilton Si, MD;  Location: [facility] ENDOSCOPY;  Service: Cardiovascular;  Laterality: N/A;    Current Medications: Current Meds  Medication Sig   ALPRAZolam (XANAX) 0.5 MG tablet Take 0.5 mg by mouth at bedtime as needed for anxiety.   amLODipine (NORVASC) 10 MG tablet TAKE 1 TABLET DAILY (Patient taking differently: Take 10 mg by mouth daily.)   atorvastatin (LIPITOR) 20 MG tablet Take 20 mg by mouth daily.   brimonidine (ALPHAGAN) 0.2 % ophthalmic solution Place 1 drop into both eyes 2 (two) times daily.   cefdinir (OMNICEF) 300 MG capsule Take 300 mg by mouth 2 (two) times daily.   clopidogrel (PLAVIX) 75 MG tablet Take 1 tablet (75 mg total) by mouth daily.   fexofenadine (ALLEGRA) 180 MG tablet Take 180 mg by mouth daily as needed for allergies.   latanoprost (XALATAN) 0.005 % ophthalmic solution Place 1 drop into both eyes at bedtime.   triamcinolone cream (KENALOG) 0.1 % as needed.     Allergies:   Shellfish allergy   Social History   Socioeconomic History  Marital status: Married    Spouse name: Not on file   Number of children: Not on file   Years of education: Not on file   Highest education level: Not on file  Occupational History   Not on file  Tobacco Use   Smoking status: Never   Smokeless tobacco: Never  Vaping Use   Vaping Use: Never used  Substance and Sexual Activity   Alcohol use: Yes    Alcohol/week: 0.0 standard drinks    Comment: social   Drug use: No   Sexual activity: Yes    Partners: Female    Birth  control/protection: None  Other Topics Concern   Not on file  Social History Narrative   ** Merged History Encounter **       Social Determinants of Health   Financial Resource Strain: Not on file  Food Insecurity: Not on file  Transportation Needs: Not on file  Physical Activity: Not on file  Stress: Not on file  Social Connections: Not on file     Family History: The patient's family history includes Allergic rhinitis in his father; Cancer in his sister; Diabetes in his sister; Hyperlipidemia in his brother and mother; Hypertension in his brother, mother, and sister; Rheum arthritis in his sister; Stroke in his maternal grandmother. There is no history of Angioedema, Asthma, Atopy, Eczema, Immunodeficiency, or Urticaria.  ROS:   Please see the history of present illness.    All other systems reviewed and are negative.  EKGs/Labs/Other Studies Reviewed:    The following studies were reviewed today: Transcranial Doppler: A full curtain of high intensity transient signals were observed at rest  and with valsalva, indicating a Spencer Grade 5 patent foramen ovale.    Strongly Positive TCD Bubble study indicative of a large right to left  shunt   TEE 10/28/21: 1. Left ventricular ejection fraction, by estimation, is 60 to 65%. The  left ventricle has normal function. The left ventricle has no regional  wall motion abnormalities.   2. Right ventricular systolic function is normal. The right ventricular  size is normal.   3. No left atrial/left atrial appendage thrombus was detected.   4. The mitral valve is normal in structure. Trivial mitral valve  regurgitation. No evidence of mitral stenosis.   5. The aortic valve is tricuspid. Aortic valve regurgitation is not  visualized. No aortic stenosis is present.   6. The inferior vena cava is normal in size with greater than 50%  respiratory variability, suggesting right atrial pressure of 3 mmHg.   7. Evidence of atrial level  shunting detected by color flow Doppler.  Agitated saline contrast bubble study was positive with shunting observed  within 3-6 cardiac cycles suggestive of interatrial shunt. There is a  small patent foramen ovale with  bidirectional shunting across atrial septum.   Conclusion(s)/Recommendation(s): Normal biventricular function without  evidence of hemodynamically significant valvular heart disease. Findings  are concerning for an interatrial shunt as detailed above.   EKG:  EKG is ordered today.  The ekg ordered today demonstrates sinus bradycardia 56 bpm, otherwise within normal limits  Recent Labs: 10/26/2021: ALT 26 10/27/2021: TSH 3.457 10/28/2021: BUN 7; Creatinine, Ser 1.23; Hemoglobin 12.7; Platelets 267; Potassium 4.1; Sodium 136  Recent Lipid Panel    Component Value Date/Time   CHOL 120 10/27/2021 0342   CHOL 169 08/20/2020 0930   TRIG 38 10/27/2021 0342   HDL 52 10/27/2021 0342   HDL 76 08/20/2020 0930   CHOLHDL 2.3  10/27/2021 0342   VLDL 8 10/27/2021 0342   LDLCALC 60 10/27/2021 0342   LDLCALC 79 08/20/2020 0930     Risk Assessment/Calculations:           Physical Exam:    VS:  BP (!) 140/92   Pulse (!) 56   Ht 6' (1.829 m)   Wt 236 lb 6.4 oz (107.2 kg)   SpO2 96%   BMI 32.06 kg/m     Wt Readings from Last 3 Encounters:  12/05/21 236 lb 6.4 oz (107.2 kg)  11/10/21 232 lb (105.2 kg)  11/09/21 231 lb 12.8 oz (105.1 kg)     GEN:  Well nourished, well developed in no acute distress HEENT: Normal NECK: No JVD; No carotid bruits LYMPHATICS: No lymphadenopathy CARDIAC: RRR, no murmurs, rubs, gallops RESPIRATORY:  Clear to auscultation without rales, wheezing or rhonchi  ABDOMEN: Soft, non-tender, non-distended MUSCULOSKELETAL:  No edema; No deformity  SKIN: Warm and dry NEUROLOGIC:  Alert and oriented x 3 PSYCHIATRIC:  Normal affect   ASSESSMENT:    1. PFO (patent foramen ovale)    PLAN:    In order of problems listed above:  The patient was  counseled about the association between PFO and stroke.  While his rope score is 4, predicting a lower association between the PFO and etiology of stroke, anatomic features of his PFO suggest higher risk.  In addition, the patient has a previous TIA and was taking aspirin at the time of his recent event.  I personally reviewed his TEE which demonstrates a hypermobile interatrial septum and a moderate sized PFO with strongly positive bubble study.  The transcranial Doppler showed a large right to left intracardiac shunt.  I reviewed the randomized controlled trial data regarding PFO closure and secondary stroke reduction with the patient and his wife today.  I reviewed the procedural steps of PFO closure and demonstrated the closure devices with them today.  They understand the risks of transcatheter PFO closure include bleeding, vascular injury, infection, late device erosion, stroke, myocardial infarction, arrhythmia, device embolization, cardiac injury with tamponade, emergency cardiac surgery, and death.  They understand these risks occur at a very low frequency, less than 1%, with the exception of atrial fibrillation which can occur at a rate of 2 to 3%.  After reviewing risks, indications, and alternatives of transcatheter PFO closure, the patient provides full informed consent.           Medication Adjustments/Labs and Tests Ordered: Current medicines are reviewed at length with the patient today.  Concerns regarding medicines are outlined above.  Orders Placed This Encounter  Procedures   EKG 12-Lead   No orders of the defined types were placed in this encounter.   Patient Instructions  Medication Instructions:  Your physician recommends that you continue on your current medications as directed. Please refer to the Current Medication list given to you today.  *If you need a refill on your cardiac medications before your next appointment, please call your pharmacy*   Lab Work: None If  you have labs (blood work) drawn today and your tests are completely normal, you will receive your results only by: MyChart Message (if you have MyChart) OR A paper copy in the mail If you have any lab test that is abnormal or we need to change your treatment, we will call you to review the results.   Testing/Procedures: None   Follow-Up: At [facility], you and your health needs are our priority.  As part  of our continuing mission to provide you with exceptional heart care, we have created designated Provider Care Teams.  These Care Teams include your primary Cardiologist (physician) and Advanced Practice Providers (APPs -  Physician Assistants and Nurse Practitioners) who all work together to provide you with the care you need, when you need it.  We recommend signing up for the patient portal called "MyChart".  Sign up information is provided on this After Visit Summary.  MyChart is used to connect with patients for Virtual Visits (Telemedicine).  Patients are able to view lab/test results, encounter notes, upcoming appointments, etc.  Non-urgent messages can be sent to your provider as well.   To learn more about what you can do with MyChart, go to [email or web address].    Your next appointment:   1 month(s)  The format for your next appointment:   In Person  Provider:   Carlean Jews, PA-C    Other Instructions     Signed, Tonny Bollman, MD  12/09/2021 6:29 AM    [facility]

## 2021-12-06 LAB — CUP PACEART REMOTE DEVICE CHECK
Date Time Interrogation Session: 20221212113951
Implantable Pulse Generator Implant Date: 20221104

## 2021-12-08 ENCOUNTER — Inpatient Hospital Stay: Payer: 59 | Admitting: Neurology

## 2021-12-09 ENCOUNTER — Encounter: Payer: Self-pay | Admitting: Cardiovascular Disease

## 2021-12-13 NOTE — Progress Notes (Signed)
Carelink Summary Report / Loop Recorder 

## 2021-12-14 ENCOUNTER — Ambulatory Visit (INDEPENDENT_AMBULATORY_CARE_PROVIDER_SITE_OTHER): Payer: BC Managed Care – PPO | Admitting: Pulmonary Disease

## 2021-12-14 ENCOUNTER — Other Ambulatory Visit: Payer: Self-pay

## 2021-12-14 ENCOUNTER — Encounter: Payer: Self-pay | Admitting: Pulmonary Disease

## 2021-12-14 VITALS — BP 122/76 | HR 66 | Ht 72.0 in | Wt 234.4 lb

## 2021-12-14 DIAGNOSIS — R0981 Nasal congestion: Secondary | ICD-10-CM

## 2021-12-14 DIAGNOSIS — R0602 Shortness of breath: Secondary | ICD-10-CM

## 2021-12-14 DIAGNOSIS — R5383 Other fatigue: Secondary | ICD-10-CM

## 2021-12-14 LAB — PULMONARY FUNCTION TEST
DL/VA % pred: 127 %
DL/VA: 5.35 ml/min/mmHg/L
DLCO cor % pred: 97 %
DLCO cor: 28.58 ml/min/mmHg
DLCO unc % pred: 91 %
DLCO unc: 26.92 ml/min/mmHg
FEF 25-75 Post: 1.41 L/sec
FEF 25-75 Pre: 1.56 L/sec
FEF2575-%Change-Post: -9 %
FEF2575-%Pred-Post: 45 %
FEF2575-%Pred-Pre: 50 %
FEV1-%Change-Post: -2 %
FEV1-%Pred-Post: 65 %
FEV1-%Pred-Pre: 67 %
FEV1-Post: 2.21 L
FEV1-Pre: 2.26 L
FEV1FVC-%Change-Post: 3 %
FEV1FVC-%Pred-Pre: 93 %
FEV6-%Change-Post: -5 %
FEV6-%Pred-Post: 69 %
FEV6-%Pred-Pre: 74 %
FEV6-Post: 2.92 L
FEV6-Pre: 3.1 L
FEV6FVC-%Pred-Post: 103 %
FEV6FVC-%Pred-Pre: 103 %
FVC-%Change-Post: -5 %
FVC-%Pred-Post: 67 %
FVC-%Pred-Pre: 71 %
FVC-Post: 2.92 L
FVC-Pre: 3.1 L
Post FEV1/FVC ratio: 76 %
Post FEV6/FVC ratio: 100 %
Pre FEV1/FVC ratio: 73 %
Pre FEV6/FVC Ratio: 100 %
RV % pred: 108 %
RV: 2.55 L
TLC % pred: 83 %
TLC: 6.2 L

## 2021-12-14 NOTE — Progress Notes (Signed)
Full PFT performed today. °

## 2021-12-14 NOTE — Patient Instructions (Addendum)
Use fluticasone nasal spray 1 spray per nostril daily, can increase to 2 sprays per nostril daily if needed.  Can try albuterol inhaler 1-2 puffs prior to working out and monitor for any improvement in breathing with exertion.   Consider home sleep study in the future for further work up of your recent stroke to rule out causes that lead to arrythmias  We will schedule you for pulmonary function tests in 6-8 weeks with follow up  Happy Holidays!

## 2021-12-14 NOTE — Progress Notes (Signed)
Synopsis: Referred in December 2022 for issues post-covid by [name], MD  Subjective:   PATIENT ID: Scott PullingEric L Brandt GENDER: male DOB: 1961-10-08, MRN: [id number]   HPI  Chief Complaint  Patient presents with   Consult    Per patient, he had covid back in July/August 2022. States he hasn't felt right since having covid.    Scott Brandt is a 60 year old male, never smoker with history of hypertension, CVA and asthma who is referred to pulmonary clinic for evaluation post-covid infection.   He reports having covid 19 infection in July/August of this year that did not require hospitalization. He continues to experience fatigue and exertional dyspnea as he feels he is not able to workout like he has in the past. He reports doing 100+ miles of cardio activity per month last year which has decreased this year. Prior to covid he was averaging around 70 miles of cardio exercise and now is averaging around 30 miles per month. He denies chest tightness, wheezing or cough with the exertional dyspnea/fatigue. He also reports an increase in stress from work. He reports less vigor/motivation to get out of bed in the mornings and is concerned some degree of depression may be playing a role in his current state.   He was recently admitted 11/2 to 11/4 for cryptogenic stroke. He has a loop recorder in place. He does report snoring but feels refreshed in the mornings. He denies waking up with dyspnea. No concern for apneas per his wife.   He was previously treated for concern of asthma/reactive airways disease with pulmicort and as needed albuterol in the past when seen in 2017 by Dr. Sherene SiresWert.   He currently works from home. He denies any harmful dust or chemical exposures. He is a never smoker.   Past Medical History:  Diagnosis Date   Asthma    signs of asthma from Dr Sherene SiresWert    Asthma    Essential hypertension 02/24/2020   Hypercholesteremia    Left ventricular hypertrophy 02/24/2020   Lightheadedness  12/15/2020   TIA (transient ischemic attack) 2007   Urticaria      Family History  Problem Relation Age of Onset   Allergic rhinitis Father    Hyperlipidemia Mother    Hypertension Mother    Hypertension Sister    Diabetes Sister    Hypertension Brother    Hyperlipidemia Brother    Rheum arthritis Sister    Cancer Sister    Stroke Maternal Grandmother    Angioedema Neg Hx    Asthma Neg Hx    Atopy Neg Hx    Eczema Neg Hx    Immunodeficiency Neg Hx    Urticaria Neg Hx      Social History   Socioeconomic History   Marital status: Married    Spouse name: Not on file   Number of children: Not on file   Years of education: Not on file   Highest education level: Not on file  Occupational History   Not on file  Tobacco Use   Smoking status: Never   Smokeless tobacco: Never  Vaping Use   Vaping Use: Never used  Substance and Sexual Activity   Alcohol use: Yes    Alcohol/week: 0.0 standard drinks    Comment: social   Drug use: No   Sexual activity: Yes    Partners: Female    Birth control/protection: None  Other Topics Concern   Not on file  Social History Narrative   **  Merged History Encounter **       Social Determinants of Health   Financial Resource Strain: Not on file  Food Insecurity: Not on file  Transportation Needs: Not on file  Physical Activity: Not on file  Stress: Not on file  Social Connections: Not on file  Intimate Partner Violence: Not on file     Allergies  Allergen Reactions   Shellfish Allergy Anaphylaxis     Outpatient Medications Prior to Visit  Medication Sig Dispense Refill   ALPRAZolam (XANAX) 0.5 MG tablet Take 0.5 mg by mouth at bedtime as needed for anxiety.     amLODipine (NORVASC) 10 MG tablet TAKE 1 TABLET DAILY (Patient taking differently: Take 10 mg by mouth daily.) 90 tablet 3   atorvastatin (LIPITOR) 20 MG tablet Take 20 mg by mouth daily.     brimonidine (ALPHAGAN) 0.2 % ophthalmic solution Place 1 drop into both  eyes 2 (two) times daily.     clopidogrel (PLAVIX) 75 MG tablet Take 1 tablet (75 mg total) by mouth daily. 90 tablet 3   fexofenadine (ALLEGRA) 180 MG tablet Take 180 mg by mouth daily as needed for allergies.     latanoprost (XALATAN) 0.005 % ophthalmic solution Place 1 drop into both eyes at bedtime.     triamcinolone cream (KENALOG) 0.1 % as needed.     cefdinir (OMNICEF) 300 MG capsule Take 300 mg by mouth 2 (two) times daily.     No facility-administered medications prior to visit.   Review of Systems  Constitutional:  Positive for malaise/fatigue. Negative for chills, fever and weight loss.  HENT:  Negative for congestion, sinus pain and sore throat.   Eyes: Negative.   Respiratory:  Positive for shortness of breath. Negative for cough, hemoptysis, sputum production and wheezing.   Cardiovascular:  Negative for chest pain, palpitations, orthopnea, claudication and leg swelling.  Gastrointestinal:  Negative for abdominal pain, heartburn, nausea and vomiting.  Genitourinary: Negative.   Musculoskeletal:  Negative for joint pain and myalgias.  Skin:  Negative for rash.  Neurological:  Negative for weakness.  Endo/Heme/Allergies: Negative.   Psychiatric/Behavioral: Negative.     Objective:   Vitals:   12/14/21 1129  BP: 122/76  Pulse: 66  SpO2: 97%  Weight: 234 lb 6.4 oz (106.3 kg)  Height: 6' (1.829 m)     Physical Exam Constitutional:      General: He is not in acute distress. HENT:     Head: Normocephalic and atraumatic.  Eyes:     Conjunctiva/sclera: Conjunctivae normal.  Cardiovascular:     Rate and Rhythm: Normal rate and regular rhythm.     Pulses: Normal pulses.     Heart sounds: Normal heart sounds. No murmur heard. Musculoskeletal:     Right lower leg: No edema.     Left lower leg: No edema.  Skin:    General: Skin is warm and dry.  Neurological:     General: No focal deficit present.     Mental Status: He is alert.  Psychiatric:        Mood and  Affect: Mood normal.        Behavior: Behavior normal.        Thought Content: Thought content normal.        Judgment: Judgment normal.    CBC    Component Value Date/Time   WBC 5.2 10/28/2021 0359   RBC 4.29 10/28/2021 0359   HGB 12.7 (L) 10/28/2021 0359   HGB 15.1 08/20/2020 0930  HCT 38.4 (L) 10/28/2021 0359   HCT 43.3 08/20/2020 0930   PLT 267 10/28/2021 0359   PLT 272 08/20/2020 0930   MCV 89.5 10/28/2021 0359   MCV 89 08/20/2020 0930   MCH 29.6 10/28/2021 0359   MCHC 33.1 10/28/2021 0359   RDW 14.0 10/28/2021 0359   RDW 13.2 08/20/2020 0930   LYMPHSABS 2.2 10/26/2021 1545   LYMPHSABS 2.3 08/20/2020 0930   MONOABS 0.5 10/26/2021 1545   EOSABS 0.1 10/26/2021 1545   EOSABS 0.1 08/20/2020 0930   BASOSABS 0.1 10/26/2021 1545   BASOSABS 0.1 08/20/2020 0930   BMP Latest Ref Rng & Units 10/28/2021 10/27/2021 10/26/2021  Glucose 70 - 99 mg/dL 99 87 91  BUN 6 - 20 mg/dL 7 8 11   Creatinine 0.61 - 1.24 mg/dL 5.72 6.20)  BUN/Creat Ratio 9 - 20 - - -  Sodium 135 - 145 mmol/L 136 138 138  Potassium 3.5 - 5.1 mmol/L 4.1 3.9 3.7  Chloride 98 - 111 mmol/L 104 106 103  CO2 22 - 32 mmol/L 27 26 26   Calcium 8.9 - 10.3 mg/dL 3.55(H) ) 9.0   Chest imaging: CT Cardiac Scoring 11/01/2017 Limited view of the lung parenchyma demonstrates no suspicious nodularity. Airways are normal.   Limited view of the mediastinum demonstrates no adenopathy. Esophagus normal.   Limited view of the upper abdomen unremarkable.  PFT: PFT Results Latest Ref Rng & Units 12/14/2021  FVC-Pre L 3.10  FVC-Predicted Pre % 71  FVC-Post L 2.92  FVC-Predicted Post % 67  Pre FEV1/FVC % % 73  Post FEV1/FCV % % 76  FEV1-Pre L 2.26  FEV1-Predicted Pre % 67  FEV1-Post L 2.21  DLCO uncorrected ml/min/mmHg 26.92  DLCO UNC% % 91  DLCO corrected ml/min/mmHg 28.58  DLCO COR %Predicted % 97  DLVA Predicted % 127  TLC L 6.20  TLC % Predicted % 83  RV % Predicted % 108    Labs:  Path:  Echo  10/28/21: LV EF 60-65%. RV systolic function is normal. PFO noted with positive bubble study.   Heart Catheterization:  Assessment & Plan:   Shortness of breath - Plan: Pulmonary Function Test  Fatigue, unspecified type  Discussion: Royden Bulman is a 60 year old male, never smoker with history of hypertension, CVA and asthma who is referred to pulmonary clinic for evaluation post-covid infection.   He does not appear to have any significant lingering effects from his covid 19 infection in July/August. He denies cough or wheezing with the exertional dyspnea/fatigue. He has decreasing level of physical activity levels along with increasing stress from work and mild depressive state that could be leading to deconditioning and his fatigue.   We will hold off on trial of albuterol inhaler therapy as this could stimulate increase in heart rate. He has follow up with cardiology regarding his PFO and monitoring of his heart rhythm with the loop recorder. I have offered him a home sleep study to evaluate for obstructive sleep apnea as this can lead to arrhythmias such as paroxsymal atrial fibrillation. He wants to think about this option more.   For his nasal/sinus congestion he is to use fluticasone nasal spray, 1 spray per nostril daily.   We will have him follow up in 6-8 weeks for pulmonary function tests.   65 minutes spent on this visit which included direct patient care and discussion, review of records and completion of charting/orders.  67, MD [facility] Office: [phone]  Current Outpatient Medications:    ALPRAZolam (XANAX) 0.5 MG tablet, Take 0.5 mg by mouth at bedtime as needed for anxiety., Disp: , Rfl:    amLODipine (NORVASC) 10 MG tablet, TAKE 1 TABLET DAILY (Patient taking differently: Take 10 mg by mouth daily.), Disp: 90 tablet, Rfl: 3   atorvastatin (LIPITOR) 20 MG tablet, Take 20 mg by mouth daily., Disp: , Rfl:    brimonidine  (ALPHAGAN) 0.2 % ophthalmic solution, Place 1 drop into both eyes 2 (two) times daily., Disp: , Rfl:    clopidogrel (PLAVIX) 75 MG tablet, Take 1 tablet (75 mg total) by mouth daily., Disp: 90 tablet, Rfl: 3   fexofenadine (ALLEGRA) 180 MG tablet, Take 180 mg by mouth daily as needed for allergies., Disp: , Rfl:    latanoprost (XALATAN) 0.005 % ophthalmic solution, Place 1 drop into both eyes at bedtime., Disp: , Rfl:    triamcinolone cream (KENALOG) 0.1 %, as needed., Disp: , Rfl:

## 2021-12-14 NOTE — Patient Instructions (Signed)
Full PFT performed today. °

## 2021-12-21 ENCOUNTER — Telehealth: Payer: Self-pay | Admitting: Pulmonary Disease

## 2021-12-22 NOTE — Telephone Encounter (Signed)
Scott Sinner, MD  12/14/2021  8:57 PM EST     Please let patient know that he has a non-specific pattern on his pulmonary function tests. This pattern can sometimes be associated with asthma, COPD or other pulmonary conditions. If he would like to trial an inhaler, but not affect his heart rate, we could provide a trial of spiriva 1.7mcg 2 puffs daily.    Thanks, JD   Spoke to patient and relayed results. Patient stated that he is not experiencing any sx currently. He would like to hold off on spiriva and OV. He will call back to schedule OV.  Routing to Dr. Francine Graven as an Lorain Childes.

## 2021-12-28 ENCOUNTER — Ambulatory Visit (HOSPITAL_BASED_OUTPATIENT_CLINIC_OR_DEPARTMENT_OTHER): Payer: BC Managed Care – PPO | Admitting: Cardiovascular Disease

## 2022-01-09 ENCOUNTER — Ambulatory Visit: Payer: BC Managed Care – PPO

## 2022-01-12 ENCOUNTER — Telehealth: Payer: Self-pay

## 2022-01-12 NOTE — Telephone Encounter (Signed)
-----   Message from Theodoro Parma, RN sent at 01/12/2022  3:46 PM EST ----- Regarding: loop recorder Hey!  This patient is seeing Nell Range tomorrow. Can we get an updated linq reading?  Thank you! KK

## 2022-01-12 NOTE — Progress Notes (Signed)
[facility]   MULTIDISCIPLINARY HEART VALVE CLINIC                                     Cardiology Office Note:    Date:  01/13/2022   ID:  Scott Brandt, DOB 1961/09/27, MRN [id number]  PCP:  Karle Plumber, MD  [facility] Cardiologist:  Chilton Si, MD  [facility] Electrophysiologist:  None   Referring MD: Karle Plumber, MD   Follow up of PFO   History of Present Illness:    Scott Brandt is a 61 y.o. male with a hx of HTN, HLD, aortic arch atherosclerosis, TIA/CVAs s/p loop recorder implantation and PFO who presents to clinic for follow up.     He initially was diagnosed with a TIA in 2007.  Stroke risk factors include a history of hypertension and hypercholesterolemia.  The patient was admitted in November 2022 with expressive aphasia and right facial droop, diagnosed with an acute ischemic stroke, and treated with TNKase with resolution of his symptoms.  There was no arrhythmia seen on telemetry in the hospital.  A loop recorder was implanted for ongoing monitoring.  Transesophageal echo demonstrated no significant valvular heart disease and normal LV function.  A PFO was demonstrated.  A CTA of the chest showed arch atherosclerosis with no large vessel occlusive disease.  The carotid arteries were widely patent with no significant disease.  The patient has undergone outpatient neurology follow-up and a transcranial Doppler study demonstrated large intracardiac right to left shunt.   He was seen in consultation by Dr. Excell Seltzer on 12/05/21 for consideration of PFO closure. Per Dr. Excell Seltzer, while his rope score is 4, predicting a lower association between the PFO and etiology of stroke, anatomic features of his PFO suggest higher risk.  In addition, the patient has a previous TIA and was taking aspirin at the time of his recent event.  He personally reviewed his TEE which demonstrates a hypermobile interatrial septum and a moderate sized PFO with strongly positive  bubble study.  The transcranial Doppler showed a large right to left intracardiac shunt.  Patient's loop recorder has not shown any atrial fib to date, this was interrogated yesterday 01/12/22.  Today the patient presents to clinic for follow up. He walked 3-4 miles a day with no issues and trying to get back on track with his health. He is also factoring in his sons wedding which is end of February. No CP or SOB. No LE edema, orthopnea or PND. No dizziness or syncope. No blood in stool or urine. No palpitations.   Past Medical History:  Diagnosis Date   Asthma    signs of asthma from Dr Sherene Sires    Asthma    Essential hypertension 02/24/2020   Hypercholesteremia    Left ventricular hypertrophy 02/24/2020   Lightheadedness 12/15/2020   TIA (transient ischemic attack) 2007   Urticaria     Past Surgical History:  Procedure Laterality Date   BUBBLE STUDY  10/28/2021   Procedure: BUBBLE STUDY;  Surgeon: Chilton Si, MD;  Location: [facility] ENDOSCOPY;  Service: Cardiovascular;;   FEMUR IM NAIL Left 02/28/2016   Procedure: INTRAMEDULLARY (IM) NAIL FEMORAL;  Surgeon: Durene Romans, MD;  Location: [facility] OR;  Service: Orthopedics;  Laterality: Left;   LOOP RECORDER INSERTION N/A 10/28/2021   Procedure: LOOP RECORDER INSERTION;  Surgeon: Regan Lemming, MD;  Location: [facility] INVASIVE  CV LAB;  Service: Cardiovascular;  Laterality: N/A;  ° TEE WITHOUT CARDIOVERSION N/A 10/28/2021  ° Procedure: TRANSESOPHAGEAL ECHOCARDIOGRAM (TEE);  Surgeon: Bruno, Tiffany, MD;  Location: [facility] ENDOSCOPY;  Service: Cardiovascular;  Laterality: N/A;  ° ° °Current Medications: °Current Meds  °Medication Sig  ° ALPRAZolam (XANAX) 0.5 MG tablet Take 0.5 mg by mouth at bedtime as needed for anxiety.  ° amLODipine (NORVASC) 10 MG tablet TAKE 1 TABLET DAILY (Patient taking differently: Take 10 mg by mouth daily.)  ° amoxicillin (AMOXIL) 500 MG capsule TAKE 4 CAPS BY MOUTH 1 HR PRIOR TO DENTAL APPT  ° atorvastatin (LIPITOR) 20 MG tablet Take  20 mg by mouth daily.  ° brimonidine (ALPHAGAN) 0.2 % ophthalmic solution Place 1 drop into both eyes 2 (two) times daily.  ° clopidogrel (PLAVIX) 75 MG tablet Take 1 tablet (75 mg total) by mouth daily.  ° fexofenadine (ALLEGRA) 180 MG tablet Take 180 mg by mouth daily as needed for allergies.  ° latanoprost (XALATAN) 0.005 % ophthalmic solution Place 1 drop into both eyes at bedtime.  ° triamcinolone cream (KENALOG) 0.1 % as needed.  °  ° °Allergies:   Shellfish allergy  ° °Social History  ° °Socioeconomic History  ° Marital status: Married  °  Spouse name: Not on file  ° Number of children: Not on file  ° Years of education: Not on file  ° Highest education level: Not on file  °Occupational History  ° Not on file  °Tobacco Use  ° Smoking status: Never  ° Smokeless tobacco: Never  °Vaping Use  ° Vaping Use: Never used  °Substance and Sexual Activity  ° Alcohol use: Yes  °  Alcohol/week: 0.0 standard drinks  °  Comment: social  ° Drug use: No  ° Sexual activity: Yes  °  Partners: Female  °  Birth control/protection: None  °Other Topics Concern  ° Not on file  °Social History Narrative  ° ** Merged History Encounter **  °    ° °Social Determinants of Health  ° °Financial Resource Strain: Not on file  °Food Insecurity: Not on file  °Transportation Needs: Not on file  °Physical Activity: Not on file  °Stress: Not on file  °Social Connections: Not on file  °  ° °Family History: °The patient's family history includes Allergic rhinitis in his father; Cancer in his sister; Diabetes in his sister; Hyperlipidemia in his brother and mother; Hypertension in his brother, mother, and sister; Rheum arthritis in his sister; Stroke in his maternal grandmother. There is no history of Angioedema, Asthma, Atopy, Eczema, Immunodeficiency, or Urticaria. ° °ROS:   °Please see the history of present illness.    °All other systems reviewed and are negative. ° °EKGs/Labs/Other Studies Reviewed:   ° °The following studies were reviewed  today: ° °Transcranial Doppler: °A full curtain of high intensity transient signals were observed at rest  °and with valsalva, indicating a Spencer Grade 5 patent foramen ovale.  ° ° °Strongly Positive TCD Bubble study indicative of a large right to left  °shunt  °  °TEE 10/28/21: °1. Left ventricular ejection fraction, by estimation, is 60 to 65%. The  °left ventricle has normal function. The left ventricle has no regional  °wall motion abnormalities.  ° 2. Right ventricular systolic function is normal. The right ventricular  °size is normal.  ° 3. No left atrial/left atrial appendage thrombus was detected.  ° 4. The mitral valve is normal in structure. Trivial mitral valve  °regurgitation. No   evidence of mitral stenosis.   5. The aortic valve is tricuspid. Aortic valve regurgitation is not  visualized. No aortic stenosis is present.   6. The inferior vena cava is normal in size with greater than 50%  respiratory variability, suggesting right atrial pressure of 3 mmHg.   7. Evidence of atrial level shunting detected by color flow Doppler.  Agitated saline contrast bubble study was positive with shunting observed  within 3-6 cardiac cycles suggestive of interatrial shunt. There is a  small patent foramen ovale with  bidirectional shunting across atrial septum.   Conclusion(s)/Recommendation(s): Normal biventricular function without  evidence of hemodynamically significant valvular heart disease. Findings  are concerning for an interatrial shunt as detailed above.  EKG:  EKG is ordered today.  The ekg ordered today demonstrates sinus, HR   Recent Labs: 10/26/2021: ALT 26 10/27/2021: TSH 3.457 10/28/2021: BUN 7; Creatinine, Ser 1.23; Hemoglobin 12.7; Platelets 267; Potassium 4.1; Sodium 136  Recent Lipid Panel    Component Value Date/Time   CHOL 120 10/27/2021 0342   CHOL 169 08/20/2020 0930   TRIG 38 10/27/2021 0342   HDL 52 10/27/2021 0342   HDL 76 08/20/2020 0930   CHOLHDL 2.3 10/27/2021  0342   VLDL 8 10/27/2021 0342   LDLCALC 60 10/27/2021 0342   LDLCALC 79 08/20/2020 0930     Risk Assessment/Calculations:       Physical Exam:    VS:  BP (!) 146/88    Pulse 62    Ht 6' (1.829 m)    Wt 234 lb (106.1 kg)    SpO2 97%    BMI 31.74 kg/m     Wt Readings from Last 3 Encounters:  01/13/22 234 lb (106.1 kg)  12/14/21 234 lb 6.4 oz (106.3 kg)  12/05/21 236 lb 6.4 oz (107.2 kg)     GEN:  Well nourished, well developed in no acute distress HEENT: Normal NECK: No JVD LYMPHATICS: No lymphadenopathy CARDIAC: RRR, no murmurs, rubs, gallops RESPIRATORY:  Clear to auscultation without rales, wheezing or rhonchi  ABDOMEN: Soft, non-tender, non-distended MUSCULOSKELETAL:  No edema; No deformity  SKIN: Warm and dry NEUROLOGIC:  Alert and oriented x 3 PSYCHIATRIC:  Normal affect   ASSESSMENT:    1. PFO (patent foramen ovale)   2. Cerebrovascular accident (CVA), unspecified mechanism (HCC)   3. Essential hypertension    PLAN:    In order of problems listed above:  PFO with recurrent neurologic events and high risk anatomic features: he would like to set up PFO closure for 2/6, but he is worried about his recovery potentially affecting his work schedule and son's upcoming wedding. We discussed how a PFO closure, when it goes as expected, has a relatively short recovery. ECG today shows sinus and loop recorder interrogation done yesterday has not show any atrial fib. He will continue on Plavix and understands we will add aspirin after the closure. He will let me know if he needs to change the date.   Hx of TIA/CVA: continue on plavix and statin.   HTN: BP borderline controlled today. Continue Norvasc  Medication Adjustments/Labs and Tests Ordered: Current medicines are reviewed at length with the patient today.  Concerns regarding medicines are outlined above.  Orders Placed This Encounter  Procedures   Basic metabolic panel   CBC   EKG 12-Lead   No orders of the  defined types were placed in this encounter.   Patient Instructions  Medication Instructions:  Your physician recommends that you continue on  your current medications as directed. Please refer to the Current Medication list given to you today.   *If you need a refill on your cardiac medications before your next appointment, please call your pharmacy*   Lab Work: Bmp, Cbc- Today   If you have labs (blood work) drawn today and your tests are completely normal, you will receive your results only by: MyChart Message (if you have MyChart) OR A paper copy in the mail If you have any lab test that is abnormal or we need to change your treatment, we will call you to review the results.   Testing/Procedures: None ordered    Follow-Up: Someone from our structural team will contact you with a follow up appointment    Other Instructions None     Signed, Cline CrockKathryn Roderick Sweezy, PA-C  01/13/2022 2:43 PM    [facility]

## 2022-01-12 NOTE — Telephone Encounter (Signed)
Thank you :)

## 2022-01-12 NOTE — Telephone Encounter (Signed)
Successful telephone call to patient to follow up on lack of communication and failure to transmit monthly remotes from linq2 loop recorder to [facility] device clinic. Patient confirms he had not had the app open on his phone. Assisted patient with reconnection. Hopeful to receive transmission in the morning. Patient states if no transmission comes through he WILL NOT complete appointment tomorrow with Unknown Foley and request to be called if transmission does not come through. Patient is instructed to always have [facility] app open and keep at bedside during HS. Patient verbalized understanding.

## 2022-01-12 NOTE — H&P (View-Only) (Signed)
[facility] AND [facility]ASCULAR [facility]ENTER   MULTIDISCIPLINARY HEART VALVE CLINIC                                     Cardiology Office Note:    Date:  01/13/2022   ID:  Scott Brandt, DOB 1961/09/27, MRN [id number]  PCP:  Karle Plumber, MD  [facility] Cardiologist:  Chilton Si, MD  [facility] Electrophysiologist:  None   Referring MD: Karle Plumber, MD   Follow up of PFO   History of Present Illness:    Scott Brandt is a 61 y.o. male with a hx of HTN, HLD, aortic arch atherosclerosis, TIA/CVAs s/p loop recorder implantation and PFO who presents to clinic for follow up.     He initially was diagnosed with a TIA in 2007.  Stroke risk factors include a history of hypertension and hypercholesterolemia.  The patient was admitted in November 2022 with expressive aphasia and right facial droop, diagnosed with an acute ischemic stroke, and treated with TNKase with resolution of his symptoms.  There was no arrhythmia seen on telemetry in the hospital.  A loop recorder was implanted for ongoing monitoring.  Transesophageal echo demonstrated no significant valvular heart disease and normal LV function.  A PFO was demonstrated.  A CTA of the chest showed arch atherosclerosis with no large vessel occlusive disease.  The carotid arteries were widely patent with no significant disease.  The patient has undergone outpatient neurology follow-up and a transcranial Doppler study demonstrated large intracardiac right to left shunt.   He was seen in consultation by Dr. Excell Seltzer on 12/05/21 for consideration of PFO closure. Per Dr. Excell Seltzer, while his rope score is 4, predicting a lower association between the PFO and etiology of stroke, anatomic features of his PFO suggest higher risk.  In addition, the patient has a previous TIA and was taking aspirin at the time of his recent event.  He personally reviewed his TEE which demonstrates a hypermobile interatrial septum and a moderate sized PFO with strongly positive  bubble study.  The transcranial Doppler showed a large right to left intracardiac shunt.  Patient's loop recorder has not shown any atrial fib to date, this was interrogated yesterday 01/12/22.  Today the patient presents to clinic for follow up. He walked 3-4 miles a day with no issues and trying to get back on track with his health. He is also factoring in his sons wedding which is end of February. No CP or SOB. No LE edema, orthopnea or PND. No dizziness or syncope. No blood in stool or urine. No palpitations.   Past Medical History:  Diagnosis Date   Asthma    signs of asthma from Dr Sherene Sires    Asthma    Essential hypertension 02/24/2020   Hypercholesteremia    Left ventricular hypertrophy 02/24/2020   Lightheadedness 12/15/2020   TIA (transient ischemic attack) 2007   Urticaria     Past Surgical History:  Procedure Laterality Date   BUBBLE STUDY  10/28/2021   Procedure: BUBBLE STUDY;  Surgeon: Chilton Si, MD;  Location: [facility] ENDOSCOPY;  Service: Cardiovascular;;   FEMUR IM NAIL Left 02/28/2016   Procedure: INTRAMEDULLARY (IM) NAIL FEMORAL;  Surgeon: Durene Romans, MD;  Location: [facility] OR;  Service: Orthopedics;  Laterality: Left;   LOOP RECORDER INSERTION N/A 10/28/2021   Procedure: LOOP RECORDER INSERTION;  Surgeon: Regan Lemming, MD;  Location: [facility] INVASIVE  CV LAB;  Service: Cardiovascular;  Laterality: N/A;   TEE WITHOUT CARDIOVERSION N/A 10/28/2021   Procedure: TRANSESOPHAGEAL ECHOCARDIOGRAM (TEE);  Surgeon: Chilton Siandolph, Tiffany, MD;  [facility];  Service: Cardiovascular;  Laterality: N/A;    Current Medications: Current Meds  Medication Sig   ALPRAZolam (XANAX) 0.5 MG tablet Take 0.5 mg by mouth at bedtime as needed for anxiety.   amLODipine (NORVASC) 10 MG tablet TAKE 1 TABLET DAILY (Patient taking differently: Take 10 mg by mouth daily.)   amoxicillin (AMOXIL) 500 MG capsule TAKE 4 CAPS BY MOUTH 1 HR PRIOR TO DENTAL APPT   atorvastatin (LIPITOR) 20 MG tablet Take  20 mg by mouth daily.   brimonidine (ALPHAGAN) 0.2 % ophthalmic solution Place 1 drop into both eyes 2 (two) times daily.   clopidogrel (PLAVIX) 75 MG tablet Take 1 tablet (75 mg total) by mouth daily.   fexofenadine (ALLEGRA) 180 MG tablet Take 180 mg by mouth daily as needed for allergies.   latanoprost (XALATAN) 0.005 % ophthalmic solution Place 1 drop into both eyes at bedtime.   triamcinolone cream (KENALOG) 0.1 % as needed.     Allergies:   Shellfish allergy   Social History   Socioeconomic History   Marital status: Married    Spouse name: Not on file   Number of children: Not on file   Years of education: Not on file   Highest education level: Not on file  Occupational History   Not on file  Tobacco Use   Smoking status: Never   Smokeless tobacco: Never  Vaping Use   Vaping Use: Never used  Substance and Sexual Activity   Alcohol use: Yes    Alcohol/week: 0.0 standard drinks    Comment: social   Drug use: No   Sexual activity: Yes    Partners: Female    Birth control/protection: None  Other Topics Concern   Not on file  Social History Narrative   ** Merged History Encounter **       Social Determinants of Health   Financial Resource Strain: Not on file  Food Insecurity: Not on file  Transportation Needs: Not on file  Physical Activity: Not on file  Stress: Not on file  Social Connections: Not on file     Family History: The patient's family history includes Allergic rhinitis in his father; Cancer in his sister; Diabetes in his sister; Hyperlipidemia in his brother and mother; Hypertension in his brother, mother, and sister; Rheum arthritis in his sister; Stroke in his maternal grandmother. There is no history of Angioedema, Asthma, Atopy, Eczema, Immunodeficiency, or Urticaria.  ROS:   Please see the history of present illness.    All other systems reviewed and are negative.  EKGs/Labs/Other Studies Reviewed:    The following studies were reviewed  today:  Transcranial Doppler: A full curtain of high intensity transient signals were observed at rest  and with valsalva, indicating a Spencer Grade 5 patent foramen ovale.    Strongly Positive TCD Bubble study indicative of a large right to left  shunt    TEE 10/28/21: 1. Left ventricular ejection fraction, by estimation, is 60 to 65%. The  left ventricle has normal function. The left ventricle has no regional  wall motion abnormalities.   2. Right ventricular systolic function is normal. The right ventricular  size is normal.   3. No left atrial/left atrial appendage thrombus was detected.   4. The mitral valve is normal in structure. Trivial mitral valve  regurgitation. No  evidence of mitral stenosis.   5. The aortic valve is tricuspid. Aortic valve regurgitation is not  visualized. No aortic stenosis is present.   6. The inferior vena cava is normal in size with greater than 50%  respiratory variability, suggesting right atrial pressure of 3 mmHg.   7. Evidence of atrial level shunting detected by color flow Doppler.  Agitated saline contrast bubble study was positive with shunting observed  within 3-6 cardiac cycles suggestive of interatrial shunt. There is a  small patent foramen ovale with  bidirectional shunting across atrial septum.   Conclusion(s)/Recommendation(s): Normal biventricular function without  evidence of hemodynamically significant valvular heart disease. Findings  are concerning for an interatrial shunt as detailed above.  EKG:  EKG is ordered today.  The ekg ordered today demonstrates sinus, HR   Recent Labs: 10/26/2021: ALT 26 10/27/2021: TSH 3.457 10/28/2021: BUN 7; Creatinine, Ser 1.23; Hemoglobin 12.7; Platelets 267; Potassium 4.1; Sodium 136  Recent Lipid Panel    Component Value Date/Time   CHOL 120 10/27/2021 0342   CHOL 169 08/20/2020 0930   TRIG 38 10/27/2021 0342   HDL 52 10/27/2021 0342   HDL 76 08/20/2020 0930   CHOLHDL 2.3 10/27/2021  0342   VLDL 8 10/27/2021 0342   LDLCALC 60 10/27/2021 0342   LDLCALC 79 08/20/2020 0930     Risk Assessment/Calculations:       Physical Exam:    VS:  BP (!) 146/88    Pulse 62    Ht 6' (1.829 m)    Wt 234 lb (106.1 kg)    SpO2 97%    BMI 31.74 kg/m     Wt Readings from Last 3 Encounters:  01/13/22 234 lb (106.1 kg)  12/14/21 234 lb 6.4 oz (106.3 kg)  12/05/21 236 lb 6.4 oz (107.2 kg)     GEN:  Well nourished, well developed in no acute distress HEENT: Normal NECK: No JVD LYMPHATICS: No lymphadenopathy CARDIAC: RRR, no murmurs, rubs, gallops RESPIRATORY:  Clear to auscultation without rales, wheezing or rhonchi  ABDOMEN: Soft, non-tender, non-distended MUSCULOSKELETAL:  No edema; No deformity  SKIN: Warm and dry NEUROLOGIC:  Alert and oriented x 3 PSYCHIATRIC:  Normal affect   ASSESSMENT:    1. PFO (patent foramen ovale)   2. Cerebrovascular accident (CVA), unspecified mechanism (HCC)   3. Essential hypertension    PLAN:    In order of problems listed above:  PFO with recurrent neurologic events and high risk anatomic features: he would like to set up PFO closure for 2/6, but he is worried about his recovery potentially affecting his work schedule and son's upcoming wedding. We discussed how a PFO closure, when it goes as expected, has a relatively short recovery. ECG today shows sinus and loop recorder interrogation done yesterday has not show any atrial fib. He will continue on Plavix and understands we will add aspirin after the closure. He will let me know if he needs to change the date.   Hx of TIA/CVA: continue on plavix and statin.   HTN: BP borderline controlled today. Continue Norvasc  Medication Adjustments/Labs and Tests Ordered: Current medicines are reviewed at length with the patient today.  Concerns regarding medicines are outlined above.  Orders Placed This Encounter  Procedures   Basic metabolic panel   CBC   EKG 12-Lead   No orders of the  defined types were placed in this encounter.   Patient Instructions  Medication Instructions:  Your physician recommends that you continue on  your current medications as directed. Please refer to the Current Medication list given to you today.   *If you need a refill on your cardiac medications before your next appointment, please call your pharmacy*   Lab Work: Bmp, Cbc- Today   If you have labs (blood work) drawn today and your tests are completely normal, you will receive your results only by: MyChart Message (if you have MyChart) OR A paper copy in the mail If you have any lab test that is abnormal or we need to change your treatment, we will call you to review the results.   Testing/Procedures: None ordered    Follow-Up: Someone from our structural team will contact you with a follow up appointment    Other Instructions None     Signed, Cline CrockKathryn Yailyn Strack, PA-C  01/13/2022 2:43 PM    [facility]

## 2022-01-13 ENCOUNTER — Other Ambulatory Visit: Payer: Self-pay

## 2022-01-13 ENCOUNTER — Telehealth: Payer: Self-pay | Admitting: Physician Assistant

## 2022-01-13 ENCOUNTER — Encounter: Payer: Self-pay | Admitting: Physician Assistant

## 2022-01-13 ENCOUNTER — Ambulatory Visit (INDEPENDENT_AMBULATORY_CARE_PROVIDER_SITE_OTHER): Payer: BC Managed Care – PPO | Admitting: Physician Assistant

## 2022-01-13 VITALS — BP 146/88 | HR 62 | Ht 72.0 in | Wt 234.0 lb

## 2022-01-13 DIAGNOSIS — Q2112 Patent foramen ovale: Secondary | ICD-10-CM | POA: Diagnosis not present

## 2022-01-13 DIAGNOSIS — I639 Cerebral infarction, unspecified: Secondary | ICD-10-CM

## 2022-01-13 DIAGNOSIS — I10 Essential (primary) hypertension: Secondary | ICD-10-CM | POA: Diagnosis not present

## 2022-01-13 LAB — CBC
Hematocrit: 42.7 % (ref 37.5–51.0)
Hemoglobin: 14.6 g/dL (ref 13.0–17.7)
MCH: 30.6 pg (ref 26.6–33.0)
MCHC: 34.2 g/dL (ref 31.5–35.7)
MCV: 90 fL (ref 79–97)
Platelets: 274 10*3/uL (ref 150–450)
RBC: 4.77 x10E6/uL (ref 4.14–5.80)
RDW: 13.2 % (ref 11.6–15.4)
WBC: 5.6 10*3/uL (ref 3.4–10.8)

## 2022-01-13 LAB — BASIC METABOLIC PANEL
BUN/Creatinine Ratio: 8 — ABNORMAL LOW (ref 10–24)
BUN: 9 mg/dL (ref 8–27)
CO2: 26 mmol/L (ref 20–29)
Calcium: 9.6 mg/dL (ref 8.6–10.2)
Chloride: 99 mmol/L (ref 96–106)
Creatinine, Ser: 1.16 mg/dL (ref 0.76–1.27)
Glucose: 78 mg/dL (ref 70–99)
Potassium: 4 mmol/L (ref 3.5–5.2)
Sodium: 138 mmol/L (ref 134–144)
eGFR: 72 mL/min/{1.73_m2} (ref >59)

## 2022-01-13 NOTE — Telephone Encounter (Signed)
Transmission received this morning. No arrythmias recorded.  Pt is having 0.3% PVCs  Patient notified that transmission was received.  Patient is questioning if he should still have appointment this morning with Carlean Jews.  He states that he was told there would be a 30 day window for his procedure after is visit, he is not ready to have a procedure within 30 days from now and thinks maybe his appointment should be moved a week out.    Advised patient I am not familiar with what he referring to and will have to forward this info to provider for further review

## 2022-01-13 NOTE — Telephone Encounter (Signed)
Patient will keep his appointment today with [facility]

## 2022-01-13 NOTE — Telephone Encounter (Signed)
New Message:     Patient need you to call him asap, concerning his appointment today at 11:00

## 2022-01-13 NOTE — Patient Instructions (Signed)
Medication Instructions:  Your physician recommends that you continue on your current medications as directed. Please refer to the Current Medication list given to you today.   *If you need a refill on your cardiac medications before your next appointment, please call your pharmacy*   Lab Work: Bmp, Cbc- Today   If you have labs (blood work) drawn today and your tests are completely normal, you will receive your results only by: MyChart Message (if you have MyChart) OR A paper copy in the mail If you have any lab test that is abnormal or we need to change your treatment, we will call you to review the results.   Testing/Procedures: None ordered    Follow-Up: Someone from our structural team will contact you with a follow up appointment    Other Instructions None

## 2022-01-16 ENCOUNTER — Encounter: Payer: Self-pay | Admitting: Cardiovascular Disease

## 2022-01-16 ENCOUNTER — Encounter: Payer: Self-pay | Admitting: Cardiology

## 2022-01-26 ENCOUNTER — Telehealth: Payer: Self-pay | Admitting: *Deleted

## 2022-01-26 NOTE — Telephone Encounter (Addendum)
PFO Closure scheduled at [facility] for: Monday January 30, 2022 9:30 AM [facility] Main Entrance A [facility]) at: 7:30 AM   Diet-no solid food after midnight prior to cath, clear liquids until 5 AM day of procedure.  Medication instructions for procedure: -Usual morning medications can be taken pre-cath with sips of water including aspirin 81 mg and Plavix 75 mg.    Must have responsible adult to drive home post procedure and be with patient first 24 hours after arriving home.  [facility] does allow one visitor to accompany you and wait in the hospital waiting room while you are there for your procedure. You and your visitor will be asked to wear a mask once you enter the hospital.   Patient reports does not currently have any new symptoms concerning for COVID-19 and no household members with COVID-19 like illness.        Reviewed procedure instructions with patient.

## 2022-01-30 ENCOUNTER — Other Ambulatory Visit: Payer: Self-pay

## 2022-01-30 ENCOUNTER — Ambulatory Visit (HOSPITAL_COMMUNITY)
Admission: RE | Admit: 2022-01-30 | Discharge: 2022-01-30 | Disposition: A | Discharge status: Home / Self Care | Payer: BC Managed Care – PPO | Attending: Cardiovascular Disease | Admitting: Cardiovascular Disease

## 2022-01-30 ENCOUNTER — Other Ambulatory Visit: Payer: Self-pay | Admitting: Physician Assistant

## 2022-01-30 ENCOUNTER — Ambulatory Visit (HOSPITAL_BASED_OUTPATIENT_CLINIC_OR_DEPARTMENT_OTHER): Payer: BC Managed Care – PPO

## 2022-01-30 ENCOUNTER — Encounter (HOSPITAL_COMMUNITY)
Admission: RE | Disposition: A | Discharge status: Home / Self Care | Payer: Self-pay | Source: Home / Self Care | Attending: Cardiovascular Disease

## 2022-01-30 DIAGNOSIS — Z7902 Long term (current) use of antithrombotics/antiplatelets: Secondary | ICD-10-CM | POA: Insufficient documentation

## 2022-01-30 DIAGNOSIS — Z79899 Other long term (current) drug therapy: Secondary | ICD-10-CM | POA: Insufficient documentation

## 2022-01-30 DIAGNOSIS — Z8673 Personal history of transient ischemic attack (TIA), and cerebral infarction without residual deficits: Secondary | ICD-10-CM | POA: Insufficient documentation

## 2022-01-30 DIAGNOSIS — Q2112 Patent foramen ovale: Secondary | ICD-10-CM

## 2022-01-30 DIAGNOSIS — Q211 Atrial septal defect, unspecified: Secondary | ICD-10-CM | POA: Diagnosis not present

## 2022-01-30 DIAGNOSIS — I253 Aneurysm of heart: Secondary | ICD-10-CM | POA: Diagnosis not present

## 2022-01-30 DIAGNOSIS — I700 Atherosclerosis of aorta: Secondary | ICD-10-CM | POA: Diagnosis not present

## 2022-01-30 DIAGNOSIS — E785 Hyperlipidemia, unspecified: Secondary | ICD-10-CM | POA: Diagnosis not present

## 2022-01-30 DIAGNOSIS — I10 Essential (primary) hypertension: Secondary | ICD-10-CM | POA: Insufficient documentation

## 2022-01-30 HISTORY — PX: PATENT FORAMEN OVALE(PFO) CLOSURE: CATH118300

## 2022-01-30 LAB — POCT ACTIVATED CLOTTING TIME: Activated Clotting Time: 275 seconds

## 2022-01-30 LAB — ECHOCARDIOGRAM LIMITED
Height: 72 in
Weight: 3744 oz

## 2022-01-30 SURGERY — PATENT FORAMEN OVALE (PFO) CLOSURE
Anesthesia: LOCAL

## 2022-01-30 MED ORDER — CEFAZOLIN SODIUM-DEXTROSE 2-3 GM-%(50ML) IV SOLR
INTRAVENOUS | Status: DC | PRN
  Administered 2022-01-30: 2 g via INTRAVENOUS

## 2022-01-30 MED ORDER — HEPARIN (PORCINE) IN NACL 1000-0.9 UT/500ML-% IV SOLN
INTRAVENOUS | Status: AC
  Filled 2022-01-30: qty 500

## 2022-01-30 MED ORDER — SODIUM CHLORIDE 0.9 % WEIGHT BASED INFUSION
3.0000 mL/kg/h | INTRAVENOUS | Status: AC
  Administered 2022-01-30: 3 mL/kg/h via INTRAVENOUS

## 2022-01-30 MED ORDER — MIDAZOLAM HCL 2 MG/2ML IJ SOLN
INTRAMUSCULAR | Status: DC | PRN
  Administered 2022-01-30: 2 mg via INTRAVENOUS

## 2022-01-30 MED ORDER — SODIUM CHLORIDE 0.9 % WEIGHT BASED INFUSION: 1.0000 mL/kg/h | INTRAVENOUS | Status: DC

## 2022-01-30 MED ORDER — LABETALOL HCL 5 MG/ML IV SOLN: 10.0000 mg | INTRAVENOUS | Status: DC | PRN

## 2022-01-30 MED ORDER — ONDANSETRON HCL 4 MG/2ML IJ SOLN: 4.0000 mg | Freq: Four times a day (QID) | INTRAMUSCULAR | Status: DC | PRN

## 2022-01-30 MED ORDER — SODIUM CHLORIDE 0.9 % IV SOLN: 250.0000 mL | INTRAVENOUS | Status: DC | PRN

## 2022-01-30 MED ORDER — HEPARIN SODIUM (PORCINE) 1000 UNIT/ML IJ SOLN
INTRAMUSCULAR | Status: AC
  Filled 2022-01-30: qty 10

## 2022-01-30 MED ORDER — HEPARIN SODIUM (PORCINE) 1000 UNIT/ML IJ SOLN
INTRAMUSCULAR | Status: DC | PRN
  Administered 2022-01-30: 8000 [IU] via INTRAVENOUS

## 2022-01-30 MED ORDER — HYDRALAZINE HCL 20 MG/ML IJ SOLN: 10.0000 mg | INTRAMUSCULAR | Status: DC | PRN

## 2022-01-30 MED ORDER — CEFAZOLIN SODIUM-DEXTROSE 2-4 GM/100ML-% IV SOLN
2.0000 g | INTRAVENOUS | Status: DC
  Filled 2022-01-30: qty 100

## 2022-01-30 MED ORDER — CLOPIDOGREL BISULFATE 75 MG PO TABS: 75.0000 mg | ORAL_TABLET | ORAL | Status: DC

## 2022-01-30 MED ORDER — CEFAZOLIN SODIUM-DEXTROSE 2-4 GM/100ML-% IV SOLN
INTRAVENOUS | Status: AC
  Filled 2022-01-30: qty 100

## 2022-01-30 MED ORDER — SODIUM CHLORIDE 0.9% FLUSH: 3.0000 mL | Freq: Two times a day (BID) | INTRAVENOUS | Status: DC

## 2022-01-30 MED ORDER — HEPARIN (PORCINE) IN NACL 1000-0.9 UT/500ML-% IV SOLN
INTRAVENOUS | Status: DC | PRN
  Administered 2022-01-30 (×3): 500 mL

## 2022-01-30 MED ORDER — OXYCODONE-ACETAMINOPHEN 5-325 MG PO TABS: 1.0000 | ORAL_TABLET | ORAL | 0 refills | Status: DC | PRN

## 2022-01-30 MED ORDER — SODIUM CHLORIDE 0.9% FLUSH: 3.0000 mL | INTRAVENOUS | Status: DC | PRN

## 2022-01-30 MED ORDER — FENTANYL CITRATE (PF) 100 MCG/2ML IJ SOLN
INTRAMUSCULAR | Status: AC
  Filled 2022-01-30: qty 2

## 2022-01-30 MED ORDER — ACETAMINOPHEN 325 MG PO TABS
650.0000 mg | ORAL_TABLET | ORAL | Status: DC | PRN
  Administered 2022-01-30: 650 mg via ORAL
  Filled 2022-01-30: qty 2

## 2022-01-30 MED ORDER — FENTANYL CITRATE (PF) 100 MCG/2ML IJ SOLN
INTRAMUSCULAR | Status: DC | PRN
  Administered 2022-01-30: 50 ug via INTRAVENOUS

## 2022-01-30 MED ORDER — ASPIRIN 81 MG PO CHEW: 81.0000 mg | CHEWABLE_TABLET | ORAL | Status: DC

## 2022-01-30 MED ORDER — MIDAZOLAM HCL 2 MG/2ML IJ SOLN
INTRAMUSCULAR | Status: AC
  Filled 2022-01-30: qty 2

## 2022-01-30 SURGICAL SUPPLY — 19 items
CATH 8FR ACUNAV REPROCESSED (CATHETERS) IMPLANT
CATH INFINITI 6F MPA2 100CM (CATHETERS) ×1 IMPLANT
CATH REPROCESSED 8FR ACUNAV (CATHETERS) ×2 IMPLANT
CLOSURE PERCLOSE PROSTYLE (VASCULAR PRODUCTS) ×2 IMPLANT
COVER SWIFTLINK CONNECTOR (BAG) ×1 IMPLANT
ELECT DEFIB PAD ADLT CADENCE (PAD) ×1 IMPLANT
GUIDEWIRE AMPLATZER 1.5JX260 (WIRE) ×1 IMPLANT
KIT MICROPUNCTURE NIT STIFF (SHEATH) ×1 IMPLANT
OCCLUDER PFO TALISMAN 25-18 (Prosthesis & Implant Heart) IMPLANT
PACK CARDIAC CATHETERIZATION (CUSTOM PROCEDURE TRAY) ×2 IMPLANT
PROTECTION STATION PRESSURIZED (MISCELLANEOUS) ×2
SHEATH DELIVERY TALISMAN 8F 80 (SHEATH) IMPLANT
SHEATH INTROD W/O MIN 9FR 25CM (SHEATH) ×1 IMPLANT
SHEATH PINNACLE 8F 10CM (SHEATH) ×1 IMPLANT
SHEATH PROBE COVER 6X72 (BAG) ×1 IMPLANT
STATION PROTECTION PRESSURIZED (MISCELLANEOUS) IMPLANT
TALISMAN DELIVERY SHEATH 8F 80 (SHEATH) ×2
TALISMAN PFO OCCLUDER 25-18 (Prosthesis & Implant Heart) ×2 IMPLANT
WIRE EMERALD 3MM-J .035X150CM (WIRE) ×1 IMPLANT

## 2022-01-30 NOTE — Discharge Instructions (Signed)

## 2022-01-30 NOTE — Progress Notes (Signed)
Patient requesting narcotics for his femoral site. Excell Seltzer, MD notified and stated that he will check into the situation and have something called in for the patient.

## 2022-01-30 NOTE — Interval H&P Note (Signed)
History and Physical Interval Note:  01/30/2022 9:14 AM  Scott Brandt  has presented today for surgery, with the diagnosis of pfo.  The various methods of treatment have been discussed with the patient and family. After consideration of risks, benefits and other options for treatment, the patient has consented to  Procedure(s): PATENT FORAMEN OVALE (PFO) CLOSURE (N/A) as a surgical intervention.  The patient's history has been reviewed, patient examined, no change in status, stable for surgery.  I have reviewed the patient's chart and labs.  Questions were answered to the patient's satisfaction.     [name]

## 2022-01-31 ENCOUNTER — Encounter (HOSPITAL_COMMUNITY): Payer: Self-pay | Admitting: Cardiovascular Disease

## 2022-01-31 ENCOUNTER — Encounter: Payer: Self-pay | Admitting: Cardiovascular Disease

## 2022-01-31 NOTE — Telephone Encounter (Signed)
Spoke with both the patient and his wife. He states that when he left the hospital yesterday, he had a little spot of blood on his dressing. By the time he went to bed, the blood spot was bigger but not seeping from the dressing. When he woke up this morning, it didn't seem much worse, "maybe a tiny bit more blood." The dressing is still sealed (no seeping blood).  He wanted to know if he should take the dressing off early and change it.  Instructed him not to remove the current dressing, and discussed how to make a simple pressure dressing over the current dressing with gauze and tape.  He understands to lie flat today. He understands he will be called in a few hours to remove the dressing together over the phone. He will call directly if bleeding worsens in the meantime. They were grateful for assistance.

## 2022-01-31 NOTE — Telephone Encounter (Signed)
Called the patient and coached him through removing dressing. He and his wife both state there is no active bleeding and the site looks good. Reiterated to him to take it easy for the next few days and to monitor for bleeding. He will call with questions or concerns.

## 2022-02-01 ENCOUNTER — Other Ambulatory Visit: Payer: Self-pay

## 2022-02-01 NOTE — Patient Outreach (Signed)
[name]) Care Management  02/01/2022  Scott Brandt 1961-09-16 [id number]   Telephone outreach to patient to obtain mRS was successfully completed. MRS= 0    [name] Care Management Assistant

## 2022-02-11 LAB — CUP PACEART REMOTE DEVICE CHECK
Date Time Interrogation Session: 20230217230200
Implantable Pulse Generator Implant Date: 20221104

## 2022-02-13 ENCOUNTER — Encounter: Payer: Self-pay | Admitting: Cardiovascular Disease

## 2022-02-13 ENCOUNTER — Ambulatory Visit (INDEPENDENT_AMBULATORY_CARE_PROVIDER_SITE_OTHER): Payer: BC Managed Care – PPO

## 2022-02-13 DIAGNOSIS — I639 Cerebral infarction, unspecified: Secondary | ICD-10-CM | POA: Diagnosis not present

## 2022-02-13 NOTE — Telephone Encounter (Signed)
The patient said the lump is hard and about 1 in  length. Advised this was a normal finding and could take a few months to go away. Advised if things changes or it got bigger instead of smaller to call us back.    Verbalized understanding.

## 2022-02-14 NOTE — Telephone Encounter (Signed)
Agree. thanks

## 2022-02-17 NOTE — Progress Notes (Signed)
Carelink Summary Report / Loop Recorder 

## 2022-02-27 ENCOUNTER — Ambulatory Visit: Payer: BC Managed Care – PPO

## 2022-02-27 NOTE — Progress Notes (Unsigned)
HEART AND VASCULAR CENTER   MULTIDISCIPLINARY HEART VALVE CLINIC                                     Cardiology Office Note:    Date:  02/27/2022   ID:  [name], DOB [birth year], MRN [id number]  PCP:  Karle Plumber, MD  [facility] Cardiologist:  Chilton Si, MD  [facility] Electrophysiologist:  None   Referring MD: Karle Plumber, MD   No chief complaint on file. ***  History of Present Illness:    Scott Brandt is a 61 y.o. male with a hx of HTN, HLD, aortic arch atherosclerosis, TIA/CVAs s/p loop recorder implantation and PFO who presents to clinic for follow up.      He initially was diagnosed with a TIA in 2007.  Stroke risk factors include a history of hypertension and hypercholesterolemia.  The patient was admitted in November 2022 with expressive aphasia and right facial droop, diagnosed with an acute ischemic stroke, and treated with TNKase with resolution of his symptoms.  There was no arrhythmia seen on telemetry in the hospital.  A loop recorder was implanted for ongoing monitoring.  Transesophageal echo demonstrated no significant valvular heart disease and normal LV function.  A PFO was demonstrated.  A CTA of the chest showed arch atherosclerosis with no large vessel occlusive disease.  The carotid arteries were widely patent with no significant disease.  The patient has undergone outpatient neurology follow-up and a transcranial Doppler study demonstrated large intracardiac right to left shunt.    He was seen in consultation by Dr. Excell Seltzer on 12/05/21 for consideration of PFO closure. Per Dr. Excell Seltzer, while his rope score is 4, predicting a lower association between the PFO and etiology of stroke, anatomic features of his PFO suggest higher risk.  In addition, the patient has a previous TIA and was taking aspirin at the time of his recent event.  He personally reviewed his TEE which demonstrates a hypermobile interatrial septum and a moderate sized PFO with  strongly positive bubble study.  The transcranial Doppler showed a large right to left intracardiac shunt.   Patient's loop recorder has not shown any atrial fib to das of 01/12/22.  He was seen in follow up 01/13/22 at which time he was doing very well. He underwent PFO closure 01/30/22 with Amplatzer 25 mm PFO occluder device under intracardiac echo guidance. Medication plan was for Plavix 75mg  QD for at least 6 months. Same day limited echo performed showed no residual flow. He was discharged same day.   Today he presents for follow up and states    SBE for 6 months with Amoxicillin>>need to RX.       Today the patient presents to clinic for follow up. He walked 3-4 miles a day with no issues and trying to get back on track with his health. He is also factoring in his sons wedding which is end of February. No CP or SOB. No LE edema, orthopnea or PND. No dizziness or syncope. No blood in stool or urine. No palpitations.     PFO with recurrent neurologic events and high risk anatomic features: he would like to set up PFO closure for 2/6, but he is worried about his recovery potentially affecting his work schedule and son's upcoming wedding. We discussed how a PFO closure, when it goes as expected, has a relatively short  recovery. ECG today shows sinus and loop recorder interrogation done yesterday has not show any atrial fib. He will continue on Plavix and understands we will add aspirin after the closure. He will let me know if he needs to change the date.    Hx of TIA/CVA: continue on plavix and statin.    HTN: BP borderline controlled today. Continue Norvasc      Past Medical History:  Diagnosis Date   Asthma    signs of asthma from Dr Sherene Sires    Asthma    Essential hypertension 02/24/2020   Hypercholesteremia    Left ventricular hypertrophy 02/24/2020   Lightheadedness 12/15/2020   TIA (transient ischemic attack) 2007   Urticaria     Past Surgical History:  Procedure Laterality  Date   BUBBLE STUDY  10/28/2021   Procedure: BUBBLE STUDY;  Surgeon: Chilton Si, MD;  Location: [facility] ENDOSCOPY;  Service: Cardiovascular;;   FEMUR IM NAIL Left 02/28/2016   Procedure: INTRAMEDULLARY (IM) NAIL FEMORAL;  Surgeon: Durene Romans, MD;  Location: [facility] OR;  Service: Orthopedics;  Laterality: Left;   LOOP RECORDER INSERTION N/A 10/28/2021   Procedure: LOOP RECORDER INSERTION;  Surgeon: Regan Lemming, MD;  Location: [facility] INVASIVE CV LAB;  Service: Cardiovascular;  Laterality: N/A;   PATENT FORAMEN OVALE(PFO) CLOSURE N/A 01/30/2022   Procedure: PATENT FORAMEN OVALE (PFO) CLOSURE;  Surgeon: Tonny Bollman, MD;  Location: [facility] INVASIVE CV LAB;  Service: Cardiovascular;  Laterality: N/A;   TEE WITHOUT CARDIOVERSION N/A 10/28/2021   Procedure: TRANSESOPHAGEAL ECHOCARDIOGRAM (TEE);  Surgeon: Chilton Si, MD;  Location: [facility] ENDOSCOPY;  Service: Cardiovascular;  Laterality: N/A;    Current Medications: No outpatient medications have been marked as taking for the 02/27/22 encounter (Appointment) with [facility].     Allergies:   Iodine and Shellfish allergy   Social History   Socioeconomic History   Marital status: Married    Spouse name: Not on file   Number of children: Not on file   Years of education: Not on file   Highest education level: Not on file  Occupational History   Not on file  Tobacco Use   Smoking status: Never   Smokeless tobacco: Never  Vaping Use   Vaping Use: Never used  Substance and Sexual Activity   Alcohol use: Yes    Alcohol/week: 0.0 standard drinks    Comment: social   Drug use: No   Sexual activity: Yes    Partners: Female    Birth control/protection: None  Other Topics Concern   Not on file  Social History Narrative   ** Merged History Encounter **       Social Determinants of Health   Financial Resource Strain: Not on file  Food Insecurity: Not on file  Transportation Needs: Not on file  Physical Activity: Not on  file  Stress: Not on file  Social Connections: Not on file     Family History: The patient's ***family history includes Allergic rhinitis in his father; Cancer in his sister; Diabetes in his sister; Hyperlipidemia in his brother and mother; Hypertension in his brother, mother, and sister; Rheum arthritis in his sister; Stroke in his maternal grandmother. There is no history of Angioedema, Asthma, Atopy, Eczema, Immunodeficiency, or Urticaria.  ROS:   Please see the history of present illness.    All other systems reviewed and are negative.  EKGs/Labs/Other Studies Reviewed:    The following studies were reviewed today:  PFO closure 01/30/22:  Successful transcatheter PFO closure procedure  using an Amplatzer 25 mm PFO occluder device under intracardiac echo guidance   Recommend: Clopidogrel 75 mg daily at least 6 months Same day limited echo with planned DC if no complications arise SBE prophylaxis x 6 months (see DC instructions)   Post PFO closure limited echo 01/30/22:   1. Amplatzer 25 mm PFO occluder device placed on 01/30/2022. No residual  shunt by color flow Doppler.   2. Left ventricular ejection fraction, by estimation, is 60 to 65%. The  left ventricle has normal function.   3. Trivial mitral valve regurgitation.    EKG:  EKG is *** ordered today.  The ekg ordered today demonstrates ***  Recent Labs: 10/26/2021: ALT 26 10/27/2021: TSH 3.457 01/13/2022: BUN 9; Creatinine, Ser 1.16; Hemoglobin 14.6; Platelets 274; Potassium 4.0; Sodium 138  Recent Lipid Panel    Component Value Date/Time   CHOL 120 10/27/2021 0342   CHOL 169 08/20/2020 0930   TRIG 38 10/27/2021 0342   HDL 52 10/27/2021 0342   HDL 76 08/20/2020 0930   CHOLHDL 2.3 10/27/2021 0342   VLDL 8 10/27/2021 0342   LDLCALC 60 10/27/2021 0342   LDLCALC 79 08/20/2020 0930     Risk Assessment/Calculations:   {Does this patient have ATRIAL FIBRILLATION?:[id number]}   Physical Exam:    VS:  There were no  vitals taken for this visit.    Wt Readings from Last 3 Encounters:  01/30/22 234 lb (106.1 kg)  01/13/22 234 lb (106.1 kg)  12/14/21 234 lb 6.4 oz (106.3 kg)     GEN: *** Well nourished, well developed in no acute distress HEENT: Normal NECK: No JVD; No carotid bruits LYMPHATICS: No lymphadenopathy CARDIAC: ***RRR, no murmurs, rubs, gallops RESPIRATORY:  Clear to auscultation without rales, wheezing or rhonchi  ABDOMEN: Soft, non-tender, non-distended MUSCULOSKELETAL:  No edema; No deformity  SKIN: Warm and dry NEUROLOGIC:  Alert and oriented x 3 PSYCHIATRIC:  Normal affect   ASSESSMENT:    No diagnosis found. PLAN:    In order of problems listed above:       {Are you ordering a CV Procedure (e.g. stress test, cath, DCCV, TEE, etc)?   Press F2        :[id number]}    Medication Adjustments/Labs and Tests Ordered: Current medicines are reviewed at length with the patient today.  Concerns regarding medicines are outlined above.  No orders of the defined types were placed in this encounter.  No orders of the defined types were placed in this encounter.   There are no Patient Instructions on file for this visit.   Signed, [name], NP  02/27/2022 7:47 AM    [facility]

## 2022-03-13 ENCOUNTER — Ambulatory Visit: Payer: BC Managed Care – PPO

## 2022-03-17 NOTE — Progress Notes (Signed)
?HEART AND VASCULAR CENTER   ?MULTIDISCIPLINARY HEART VALVE CLINIC ?                                    ?Cardiology Office Note:   ? ?Date:  03/21/2022  ? ?ID:  Scott Brandt, DOB [birth year], MRN [id number] ? ?PCP:  Karle Plumber, MD  ?[facility]Care Cardiologist:  Chilton Si, MD  ?[facility] Electrophysiologist:  None  ? ?Referring MD: Karle Plumber, MD  ? ?Chief Complaint  ?Patient presents with  ? Follow-up  ?  S/p PFO closure  ? ?History of Present Illness:   ? ?Scott Brandt is a 61 y.o. male with a hx of HTN, HLD, aortic arch atherosclerosis, TIA/CVAs s/p loop recorder implantation and PFO who presents to clinic for follow up after PFO closure.    ?  ?He was initially was diagnosed with a TIA in 2007. He was then admitted 10/2021 with expressive aphasia and right facial droop, diagnosed with an acute ischemic stroke, and treated with TNKase with resolution of his symptoms. There was no arrhythmia seen on telemetry in the hospital. A loop recorder was implanted for ongoing monitoring. TEE demonstrated no significant valvular heart disease and normal LV function however a PFO was found. CTA of the chest showed arch atherosclerosis with no large vessel occlusive disease with widely patent carotid arteries. The patient underwent outpatient neurology follow-up and a transcranial Doppler study demonstrated large intracardiac right to left shunt.  ?  ?He was seen in consultation by Dr. Excell Seltzer on 12/05/21 for consideration of PFO closure. Per Dr. Excell Seltzer, while his rope score is 4, predicting a lower association between the PFO and etiology of stroke, anatomic features of his PFO suggest higher risk. In addition, the patient has a hx of prior TIA and was taking aspirin at the time of his event.  He personally reviewed his TEE which demonstrated a hypermobile interatrial septum and a moderate sized PFO with strongly positive bubble study. The transcranial Doppler showed a large right to left intracardiac  shunt. ?  ?Patient's loop recorder has not shown any atrial fib to date, which was last interrogated yesterday 03/19/22.  ? ?Today he presents alone and reports he has been doing well since his procedure. He has slowly been increasing his regular exercise and is now up to walking 3 miles per day with no issues. He will sometimes get a headache however has not been able to pinpoint an exact etiology. He denies chest pain, SOB, LE edema, palpitations, orthopnea, dizziness, syncope, or any new neuro changes. He has done well on his mediations and we discussed timing of discontinuation (07/2022).  ?  ?Past Medical History:  ?Diagnosis Date  ? Asthma   ? signs of asthma from Dr Sherene Sires   ? Asthma   ? Essential hypertension 02/24/2020  ? Hypercholesteremia   ? Left ventricular hypertrophy 02/24/2020  ? Lightheadedness 12/15/2020  ? TIA (transient ischemic attack) 2007  ? Urticaria   ? ? ?Past Surgical History:  ?Procedure Laterality Date  ? BUBBLE STUDY  10/28/2021  ? Procedure: BUBBLE STUDY;  Surgeon: Chilton Si, MD;  Location: [facility] ENDOSCOPY;  Service: Cardiovascular;;  ? FEMUR IM NAIL Left 02/28/2016  ? Procedure: INTRAMEDULLARY (IM) NAIL FEMORAL;  Surgeon: Durene Romans, MD;  Location: [facility] OR;  Service: Orthopedics;  Laterality: Left;  ? LOOP RECORDER INSERTION N/A 10/28/2021  ? Procedure: LOOP RECORDER INSERTION;  Surgeon: Regan Lemmingamnitz, Will Martin, MD;  [facility];  Service: Cardiovascular;  Laterality: N/A;  ? PATENT FORAMEN OVALE(PFO) CLOSURE N/A 01/30/2022  ? Procedure: PATENT FORAMEN OVALE (PFO) CLOSURE;  Surgeon: Tonny Bollmanooper, Michael, MD;  [facility];  Service: Cardiovascular;  Laterality: N/A;  ? TEE WITHOUT CARDIOVERSION N/A 10/28/2021  ? Procedure: TRANSESOPHAGEAL ECHOCARDIOGRAM (TEE);  Surgeon: Chilton Siandolph, Tiffany, MD;  [facility];  Service: Cardiovascular;  Laterality: N/A;  ? ? ?Current Medications: ?Current Meds  ?Medication Sig  ? amLODipine (NORVASC) 10 MG tablet TAKE 1 TABLET  DAILY  ? amoxicillin (AMOXIL) 500 MG capsule TAKE 4 CAPS BY MOUTH 1 HR PRIOR TO DENTAL APPT  ? atorvastatin (LIPITOR) 20 MG tablet Take 20 mg by mouth daily.  ? clopidogrel (PLAVIX) 75 MG tablet Take 1 tablet (75 mg total) by mouth daily.  ? fexofenadine (ALLEGRA) 180 MG tablet Take 180 mg by mouth daily as needed for allergies.  ? oxyCODONE-acetaminophen (PERCOCET) 5-325 MG tablet Take 1 tablet by mouth every 4 (four) hours as needed for severe pain.  ?  ? ?Allergies:   Iodine and Shellfish allergy  ? ?Social History  ? ?Socioeconomic History  ? Marital status: Married  ?  Spouse name: Not on file  ? Number of children: Not on file  ? Years of education: Not on file  ? Highest education level: Not on file  ?Occupational History  ? Not on file  ?Tobacco Use  ? Smoking status: Never  ? Smokeless tobacco: Never  ?Vaping Use  ? Vaping Use: Never used  ?Substance and Sexual Activity  ? Alcohol use: Yes  ?  Alcohol/week: 0.0 standard drinks  ?  Comment: social  ? Drug use: No  ? Sexual activity: Yes  ?  Partners: Female  ?  Birth control/protection: None  ?Other Topics Concern  ? Not on file  ?Social History Narrative  ? ** Merged History Encounter **  ?    ? ?Social Determinants of Health  ? ?Financial Resource Strain: Not on file  ?Food Insecurity: Not on file  ?Transportation Needs: Not on file  ?Physical Activity: Not on file  ?Stress: Not on file  ?Social Connections: Not on file  ?  ? ?Family History: ?The patient's family history includes Allergic rhinitis in his father; Cancer in his sister; Diabetes in his sister; Hyperlipidemia in his brother and mother; Hypertension in his brother, mother, and sister; Rheum arthritis in his sister; Stroke in his maternal grandmother. There is no history of Angioedema, Asthma, Atopy, Eczema, Immunodeficiency, or Urticaria. ? ?ROS:   ?Please see the history of present illness.    ?All other systems reviewed and are negative. ? ?EKGs/Labs/Other Studies Reviewed:   ? ?The  following studies were reviewed today: ? ?Transcranial Doppler: ?A full curtain of high intensity transient signals were observed at rest  ?and with valsalva, indicating a Spencer Grade 5 patent foramen ovale.  ? ? ?Strongly Positive TCD Bubble study indicative of a large right to left  ?shunt  ?  ?TEE 10/28/21: ?1. Left ventricular ejection fraction, by estimation, is 60 to 65%. The  ?left ventricle has normal function. The left ventricle has no regional  ?wall motion abnormalities.  ? 2. Right ventricular systolic function is normal. The right ventricular  ?size is normal.  ? 3. No left atrial/left atrial appendage thrombus was detected.  ? 4. The mitral valve is normal in structure. Trivial mitral valve  ?regurgitation. No evidence of mitral stenosis.  ?  5. The aortic valve is tricuspid. Aortic valve regurgitation is not  ?visualized. No aortic stenosis is present.  ? 6. The inferior vena cava is normal in size with greater than 50%  ?respiratory variability, suggesting right atrial pressure of 3 mmHg.  ? 7. Evidence of atrial level shunting detected by color flow Doppler.  ?Agitated saline contrast bubble study was positive with shunting observed  ?within 3-6 cardiac cycles suggestive of interatrial shunt. There is a  ?small patent foramen ovale with  ?bidirectional shunting across atrial septum.  ? ?Conclusion(s)/Recommendation(s): Normal biventricular function without  ?evidence of hemodynamically significant valvular heart disease. Findings  ?are concerning for an interatrial shunt as detailed above. ? ?EKG:  EKG is not ordered today.   ? ?Recent Labs: ?10/26/2021: ALT 26 ?10/27/2021: TSH 3.457 ?01/13/2022: BUN 9; Creatinine, Ser 1.16; Hemoglobin 14.6; Platelets 274; Potassium 4.0; Sodium 138  ? ?Recent Lipid Panel ?   ?Component Value Date/Time  ? CHOL 120 10/27/2021 0342  ? CHOL 169 08/20/2020 0930  ? TRIG 38 10/27/2021 0342  ? HDL 52 10/27/2021 0342  ? HDL 76 08/20/2020 0930  ? CHOLHDL 2.3 10/27/2021 0342  ?  VLDL 8 10/27/2021 0342  ? LDLCALC 60 10/27/2021 0342  ? LDLCALC 79 08/20/2020 0930  ? ?Physical Exam:   ? ?VS:  BP 124/80   Pulse 74   Ht 6' (1.829 m)   Wt 238 lb 12.8 oz (108.3 kg)   SpO2 97%   BMI 32.39 kg/m?

## 2022-03-20 ENCOUNTER — Ambulatory Visit (INDEPENDENT_AMBULATORY_CARE_PROVIDER_SITE_OTHER): Payer: BC Managed Care – PPO | Admitting: Cardiology

## 2022-03-20 ENCOUNTER — Ambulatory Visit (INDEPENDENT_AMBULATORY_CARE_PROVIDER_SITE_OTHER): Payer: BC Managed Care – PPO

## 2022-03-20 ENCOUNTER — Other Ambulatory Visit: Payer: Self-pay

## 2022-03-20 VITALS — BP 124/80 | HR 74 | Ht 72.0 in | Wt 238.8 lb

## 2022-03-20 DIAGNOSIS — I253 Aneurysm of heart: Secondary | ICD-10-CM

## 2022-03-20 DIAGNOSIS — I10 Essential (primary) hypertension: Secondary | ICD-10-CM | POA: Diagnosis not present

## 2022-03-20 DIAGNOSIS — I639 Cerebral infarction, unspecified: Secondary | ICD-10-CM

## 2022-03-20 DIAGNOSIS — Q2112 Patent foramen ovale: Secondary | ICD-10-CM | POA: Diagnosis not present

## 2022-03-20 NOTE — Patient Instructions (Addendum)
Medication Instructions:  ?Your physician has recommended you make the following change in your medication:  ?CONTINUE PLAVIX UNTIL 07/30/22, THEN START ASPRIN 81 MG DAILY ?*If you need a refill on your cardiac medications before your next appointment, please call your pharmacy* ? ? ?Lab Work: ?NONE ?If you have labs (blood work) drawn today and your tests are completely normal, you will receive your results only by: ?MyChart Message (if you have MyChart) OR ?A paper copy in the mail ?If you have any lab test that is abnormal or we need to change your treatment, we will call you to review the results. ? ? ?Testing/Procedures: ?Your physician has requested that you have an LIMITED BUBBLE echocardiogram. Echocardiography is a painless test that uses sound waves to create images of your heart. It provides your doctor with information about the size and shape of your heart and how well your heart?s chambers and valves are working. This procedure takes approximately one hour. There are no restrictions for this procedure.  ? ? ?Follow-Up: ?At [facility], you and your health needs are our priority.  As part of our continuing mission to provide you with exceptional heart care, we have created designated Provider Care Teams.  These Care Teams include your primary Cardiologist (physician) and Advanced Practice Providers (APPs -  Physician Assistants and Nurse Practitioners) who all work together to provide you with the care you need, when you need it. ? ?We recommend signing up for the patient portal called "MyChart".  Sign up information is provided on this After Visit Summary.  MyChart is used to connect with patients for Virtual Visits (Telemedicine).  Patients are able to view lab/test results, encounter notes, upcoming appointments, etc.  Non-urgent messages can be sent to your provider as well.   ?To learn more about what you can do with MyChart, go to [email or web address].   ? ?Your next appointment:   ?2 month(s)  (04/2022) ? ?The format for your next appointment:   ?In Person ? ?Provider:   ?Chilton Si, MD { ? ?Your physician recommends that you schedule a follow-up appointment in: 1 YEAR WITH JILL MCDANIEL   ?

## 2022-03-21 LAB — CUP PACEART REMOTE DEVICE CHECK
Date Time Interrogation Session: 20230326230552
Implantable Pulse Generator Implant Date: 20221104

## 2022-03-29 ENCOUNTER — Ambulatory Visit: Payer: BC Managed Care – PPO | Admitting: Neurology

## 2022-03-30 NOTE — Progress Notes (Signed)
Carelink Summary Report / Loop Recorder 

## 2022-04-11 ENCOUNTER — Encounter: Payer: Self-pay | Admitting: Neurology

## 2022-04-11 ENCOUNTER — Ambulatory Visit (INDEPENDENT_AMBULATORY_CARE_PROVIDER_SITE_OTHER): Payer: BC Managed Care – PPO | Admitting: Neurology

## 2022-04-11 VITALS — BP 132/89 | HR 67 | Ht 72.0 in | Wt 237.0 lb

## 2022-04-11 DIAGNOSIS — Q2112 Patent foramen ovale: Secondary | ICD-10-CM

## 2022-04-11 DIAGNOSIS — I639 Cerebral infarction, unspecified: Secondary | ICD-10-CM | POA: Diagnosis not present

## 2022-04-11 NOTE — Progress Notes (Signed)
?[name] ?[address] ?[place]. [place] [address] ?[phone] ? ?     OFFICE FOLLOW UPVISIT NOTE ? ?Scott Brandt ?Date of Birth:  12-Oct-1961 ?Medical Record Number:  [id number]  ? ?Referring MD:  Rosalin Hawking ? ?Reason for Referral:  Stroke and PFO ? ?HPI: Initial visit 11/10/2021:Scott Brandt is a pleasant 61 year old African-American male seen today for initial office consultation visit for stroke.  History is obtained from patient and his wife is accompanying him today as well as review of electronic medical records and I personally reviewed pertinent available imaging films in PACS.  He has past medical history of hypertension, hyperlipidemia, and asthma.  He presented on 10/26/2021 for sudden onset of expressive aphasia with speech difficulties and right facial droop.  He had earlier been on a Zoom call all morning and was fine.  He blew some leaves from his driveway and subsequently had lunch and went upstairs for another meeting.  He is started not feeling well around 1:00 and then came down to his wife and while trying to speak he could not get his words together.  Wife noticed right lower facial droop.  Patient his wife to him to the emergency room.  A code stroke was activated and he was seen by telestroke and found to have NIH stroke scale of 3 with expressive aphasia, dysarthria and right facial droop.  He was given IV TNK and transferred to [facility] where his symptoms subsequently resolved.  MRI scan of the brain showed tiny scattered left frontal cortical infarcts.  2D echo showed normal ejection fraction and wall bubble study was positive for PFO.  Subsequently TEE was obtained which also confirmed a small PFO but no atrial septal aneurysm clot was noted.  LDL cholesterol 60 mg percent and hemoglobin A1c was 6.2.  Hypercoagulable lab work including anticardiolipin antibodies, beta-2 glycoprotein's, lupus anticoagulant and homocystine were all normal.  Patient was  started on dual antiplatelet therapy aspirin Plavix for 3 weeks followed by aspirin alone.  Patient states is done well since discharge.  He has had no recurrent speech difficulties or any other TIA or stroke symptoms.  He saw cardiologist Dr. Oval Linsey recently was referred him to Dr. Burt Knack for PFO closure.  Patient states he had a somewhat similar episode in 2007 when he had expressive aphasia episode.  He was treated at the [facility] in [place] with IV tPA and with full resolution of his symptoms.  Those records are not available for my review but patient was told it was a TIA so I presume the MRI did not show definite stroke.  Patient states subsequently had a polysomnogram done as an outpatient and was not found to have sleep apnea.  Patient is back to work without restrictions.  He has no complaints today.  He has been reading about PFO and has a lot of questions about whether he is a candidate for PFO closure or not.  He denies any history of deep vein thrombosis, pulmonary embolism, stroke or heart attacks at a young age in his family.  He had loop recorder inserted on 11/0/22 and so far paroxysmal A. fib has not yet been found.  He denies history of palpitations, syncope or cardiac arrhythmias. ?Update 04/11/2022 : He returns for follow-up after last visit 5 months ago.  He had TCD bubble study done on 12/02/2021 which was strongly positive with the Carton sign indicating of a large right-to-left shunt.  He saw Dr. Burt Knack  and an elective endovascular PFO closure on 01/30/2022 uneventfully and has done well since then.  He has had no recurrent stroke or TIA symptoms.  He is on Plavix 75 mg daily which is tolerating well without bruising or bleeding.  His blood pressure is under good control today it is 132/89.  Remains on Lipitor which is tolerating well without muscle aches and pains.  He complains of occasional cognitive slowing or any sitting in board meetings and he cannot remember things but he  can remember them later.  On Mini-Mental status exam testing today scored 30/30 and on MoCA testing scored 28/30.  He has no complaints today otherwise. ?ROS:   ?14 system review of systems is positive for trouble thinking  , tiredness and fatigue and all other systems negative ? ?PMH:  ?Past Medical History:  ?Diagnosis Date  ? Asthma   ? signs of asthma from Dr Melvyn Novas   ? Asthma   ? Essential hypertension 02/24/2020  ? Hypercholesteremia   ? Left ventricular hypertrophy 02/24/2020  ? Lightheadedness 12/15/2020  ? TIA (transient ischemic attack) 2007  ? Urticaria   ? ? ?Social History:  ?Social History  ? ?Socioeconomic History  ? Marital status: Married  ?  Spouse name: Not on file  ? Number of children: Not on file  ? Years of education: Not on file  ? Highest education level: Not on file  ?Occupational History  ? Not on file  ?Tobacco Use  ? Smoking status: Never  ? Smokeless tobacco: Never  ?Vaping Use  ? Vaping Use: Never used  ?Substance and Sexual Activity  ? Alcohol use: Yes  ?  Alcohol/week: 0.0 standard drinks  ?  Comment: social  ? Drug use: No  ? Sexual activity: Yes  ?  Partners: Female  ?  Birth control/protection: None  ?Other Topics Concern  ? Not on file  ?Social History Narrative  ? ** Merged History Encounter **  ?    ? ?Social Determinants of Health  ? ?Financial Resource Strain: Not on file  ?Food Insecurity: Not on file  ?Transportation Needs: Not on file  ?Physical Activity: Not on file  ?Stress: Not on file  ?Social Connections: Not on file  ?Intimate Partner Violence: Not on file  ? ? ?Medications:   ?Current Outpatient Medications on File Prior to Visit  ?Medication Sig Dispense Refill  ? amLODipine (NORVASC) 10 MG tablet TAKE 1 TABLET DAILY 90 tablet 3  ? atorvastatin (LIPITOR) 20 MG tablet Take 20 mg by mouth daily.    ? clopidogrel (PLAVIX) 75 MG tablet Take 1 tablet (75 mg total) by mouth daily. 90 tablet 3  ? fexofenadine (ALLEGRA) 180 MG tablet Take 180 mg by mouth daily as needed for  allergies.    ? amoxicillin (AMOXIL) 500 MG capsule TAKE 4 CAPS BY MOUTH 1 HR PRIOR TO DENTAL APPT (Patient not taking: Reported on 04/11/2022)    ? ?No current facility-administered medications on file prior to visit.  ? ? ?Allergies:   ?Allergies  ?Allergen Reactions  ? Iodine Anaphylaxis  ? Shellfish Allergy Anaphylaxis  ? ? ?Physical Exam ?General: Mildly obese middle-aged African-American male, seated, in no evident distress ?Head: head normocephalic and atraumatic.   ?Neck: supple with no carotid or supraclavicular bruits ?Cardiovascular: regular rate and rhythm, no murmurs ?Musculoskeletal: no deformity ?Skin:  no rash/petichiae ?Vascular:  Normal pulses all extremities ? ?Neurologic Exam ?Mental Status: Awake and fully alert. Oriented to place and time. Recent and remote memory  intact. Attention span, concentration and fund of knowledge appropriate. Mood and affect appropriate.  ?Cranial Nerves: Fundoscopic exam reveals sharp disc margins. Pupils equal, briskly reactive to light. Extraocular movements full without nystagmus. Visual fields full to confrontation. Hearing intact. Facial sensation intact. Face, tongue, palate moves normally and symmetrically.  ?Motor: Normal bulk and tone. Normal strength in all tested extremity muscles. ?Sensory.: intact to touch , pinprick , position and vibratory sensation.  ?Coordination: Rapid alternating movements normal in all extremities. Finger-to-nose and heel-to-shin performed accurately bilaterally. ?Gait and Station: Arises from chair without difficulty. Stance is normal. Gait demonstrates normal stride length and balance . Able to heel, toe and tandem walk without difficulty.  ?Reflexes: 1+ and symmetric. Toes downgoing.  ? ?NIHSS 0 ?Modified Rankin 1 ? ? ?ASSESSMENT: 61 year old African-American male with expressive aphasia and right facial droop secondary to left MCA branch embolic infarct in November 2022 of cryptogenic etiology.  Vascular risk factors of  hypertension, hyperlipidemia, obesity and   PFO s/p endovascular PFO closure 01/30/2022.   ? ? ? ? ?PLAN:I had a long d/w patient about his cryptogenic  stroke and recent PFO closure, risk for recurrent stroke/TIAs, pe

## 2022-04-11 NOTE — Patient Instructions (Signed)
I had a long d/w patient about his cryptogenic  stroke and recent PFO closure, risk for recurrent stroke/TIAs, personally independently reviewed imaging studies and stroke evaluation results and answered questions.Continue Plavix 75 mg daily for secondary stroke prevention and maintain strict control of hypertension with blood pressure goal below 130/90, diabetes with hemoglobin A1c goal below 6.5% and lipids with LDL cholesterol goal below 70 mg/dL. I also advised the patient to eat a healthy diet with plenty of whole grains, cereals, fruits and vegetables, exercise regularly and maintain ideal body weight .check follow-up TCD bubble study for adequacy of PFO closure.  Followup in the future with me in 6 months or call earlier if necessary. ? ?

## 2022-04-17 ENCOUNTER — Telehealth: Payer: Self-pay | Admitting: Neurology

## 2022-04-17 NOTE — Telephone Encounter (Signed)
[employer] no auth req spoke to Earmon Phoenix ref # [id number]- sent a message to Lupita Leash she will reach out to the patient to schedule.  ?

## 2022-04-22 LAB — CUP PACEART REMOTE DEVICE CHECK
Date Time Interrogation Session: 20230428230336
Implantable Pulse Generator Implant Date: 20221104

## 2022-04-24 ENCOUNTER — Ambulatory Visit (INDEPENDENT_AMBULATORY_CARE_PROVIDER_SITE_OTHER): Payer: BC Managed Care – PPO

## 2022-04-24 DIAGNOSIS — I639 Cerebral infarction, unspecified: Secondary | ICD-10-CM | POA: Diagnosis not present

## 2022-05-01 NOTE — Progress Notes (Signed)
? ?Cardiology Office Note  ? ?Date:  05/07/2022  ? ?ID:  Scott Brandt, DOB [birth year], MRN [id number] ? ?PCP:  Scott Spanish, MD  ?Cardiologist:  Scott Latch, MD  ?Electrophysiologist:  None  ? ?Evaluation Performed:  Follow-Up Visit ? ?Chief Complaint:  hypertension ? ?History of Present Illness:   ? ?Scott Brandt is a 61 y.o. male with hyperlipidemia, asthma, anxiety and TIA (2007) who presents for follow up.  He was initially seen 09/2017 to establish care.  He reported palpitations only occur in the setting of stressful situations.  The palpitations subside after the stressful stimulus is over.  He exercises 4 times per week and has no exertional symptoms.  No further testing was performed.  He was very hypertensive in clinic.  It was unclear if this was due to anxiety.  He was started on amlodipine 2.5 mg daily and asked to check his blood pressure at home.  He also had a coronary calcium score that showed one area of calcium in the left anterior descending.  His calcium score was 4, which is 44th percentile for age and gender.  It was recommended that he start taking his atorvastatin daily. ?  ?[name] stopped his amlodipine and was trying to control his BP with diet and exercise.  However his blood pressure has been quite elevated.  At his last appointment amlodipine was added at 5 mg daily.  His blood pressure continues to be elevated so amlodipine was increased to 10 mg.  He had an echocardiogram 02/11/2020 that revealed LVEF 55 to 60% with grade 1 diastolic dysfunction.  He had moderate LVH.  The echo was otherwise unremarkable.  He had a repeat echo 07/2020 that revealed LVEF 55 to 60% with grade 1 diastolic dysfunction and mild LVH. ? ?[name]'s blood pressure remained a little elevated.  He wanted to work on diet and exercise prior to putting hydrochlorothiazide. He was admitted 10/2021 with a stroke. He presented with expressive aphasia and a right facial droop. He received TNK with near  resolution of symptoms He had a TEE with LVEF 60-65% and a small PFO. A loop recorder was inserted. Aspirin was switched to clopidogrel.  ? ?He saw Dr. Burt Knack and underwent PFO closure using an Amplatzer 25 mm PFO occluder device in 01/2022. Today, he states that he is feeling fine with no issues regarding his loop recorder. Since his PFO closure, he has noticed that his blood pressure is a lot lower than it used to be, possibly due to lower stress levels. However, he denies any significant lifestyle changes or dietary changes. He admits to gaining some weight and not being as active, although he does try to keep up some walking for exercise. Sometimes he complains of stiffness in his bilateral hips and LE. Lately he has felt slightly less motivated with less energy at times but is still able to function. He believes this would be more attributable to old age instead of depression. Of note, in the last 2 years he has started taking Lipitor regularly instead of every other day. He denies any palpitations, chest pain, shortness of breath, or peripheral edema. No lightheadedness, headaches, syncope, orthopnea, or PND. ? ?Past Medical History:  ?Diagnosis Date  ? Asthma   ? signs of asthma from Dr Scott Brandt   ? Asthma   ? Essential hypertension 02/24/2020  ? Hypercholesteremia   ? Left ventricular hypertrophy 02/24/2020  ? Lightheadedness 12/15/2020  ? TIA (transient ischemic attack) 2007  ?  Urticaria   ? ?Past Surgical History:  ?Procedure Laterality Date  ? BUBBLE STUDY  10/28/2021  ? Procedure: BUBBLE STUDY;  Surgeon: Chilton Si, MD;  Location: [facility] ENDOSCOPY;  Service: Cardiovascular;;  ? FEMUR IM NAIL Left 02/28/2016  ? Procedure: INTRAMEDULLARY (IM) NAIL FEMORAL;  Surgeon: Scott Romans, MD;  Location: [facility] OR;  Service: Orthopedics;  Laterality: Left;  ? LOOP RECORDER INSERTION N/A 10/28/2021  ? Procedure: LOOP RECORDER INSERTION;  Surgeon: Scott Lemming, MD;  Location: [facility] INVASIVE CV LAB;  Service: Cardiovascular;   Laterality: N/A;  ? PATENT FORAMEN OVALE(PFO) CLOSURE N/A 01/30/2022  ? Procedure: PATENT FORAMEN OVALE (PFO) CLOSURE;  Surgeon: Scott Bollman, MD;  Location: [facility] INVASIVE CV LAB;  Service: Cardiovascular;  Laterality: N/A;  ? TEE WITHOUT CARDIOVERSION N/A 10/28/2021  ? Procedure: TRANSESOPHAGEAL ECHOCARDIOGRAM (TEE);  Surgeon: Chilton Si, MD;  Location: [facility] ENDOSCOPY;  Service: Cardiovascular;  Laterality: N/A;  ?  ? ?Current Meds  ?Medication Sig  ? amLODipine (NORVASC) 10 MG tablet TAKE 1 TABLET DAILY  ? atorvastatin (LIPITOR) 20 MG tablet Take 20 mg by mouth daily.  ? clopidogrel (PLAVIX) 75 MG tablet Take 1 tablet (75 mg total) by mouth daily.  ? fexofenadine (ALLEGRA) 180 MG tablet Take 180 mg by mouth daily as needed for allergies.  ?  ? ?Allergies:   Iodine and Shellfish allergy  ? ?Social History  ? ?Tobacco Use  ? Smoking status: Never  ? Smokeless tobacco: Never  ?Vaping Use  ? Vaping Use: Never used  ?Substance Use Topics  ? Alcohol use: Yes  ?  Alcohol/week: 0.0 standard drinks  ?  Comment: social  ? Drug use: No  ?  ? ?Family Hx: ?The patient's family history includes Allergic rhinitis in his father; Cancer in his sister; Diabetes in his sister; Hyperlipidemia in his brother and mother; Hypertension in his brother, mother, and sister; Rheum arthritis in his sister; Stroke in his maternal grandmother. There is no history of Angioedema, Asthma, Atopy, Eczema, Immunodeficiency, or Urticaria. ? ?ROS:   ?Please see the history of present illness.    ?(+) Stiffness/myalgias of bilateral hips and bilateral LE ?All other systems reviewed and are negative. ? ? ?Prior CV studies:   ?The following studies were reviewed today: ? ?Echo 01/30/2022: ? 1. Amplatzer 25 mm PFO occluder device placed on 01/30/2022. No residual  ?shunt by color flow Doppler.  ? 2. Left ventricular ejection fraction, by estimation, is 60 to 65%. The  ?left ventricle has normal function.  ? 3. Trivial mitral valve regurgitation. ? ?Patent  Foramen Ovale Closure 01/30/2022: ?Successful transcatheter PFO closure procedure using an Amplatzer 25 mm PFO occluder device under intracardiac echo guidance ?  ?Recommend: ?Clopidogrel 75 mg daily at least 6 months ?Same day limited echo with planned DC if no complications arise ?SBE prophylaxis x 6 months (see DC instructions) ? ?Loop Recorder Insertion 10/28/2021: ?CONCLUSIONS:  ? 1. Successful implantation of a Medtronic Reveal LINQ implantable loop recorder for cryptogenic stroke ? 2. No early apparent complications.  ? ?LE Venous DVT 10/28/2021: ?Summary:  ?BILATERAL:  ?- No evidence of deep vein thrombosis seen in the lower extremities,  ?bilaterally.  ?-No evidence of popliteal cyst, bilaterally. ? ?Echo TEE 10/28/2021: ? 1. Left ventricular ejection fraction, by estimation, is 60 to 65%. The  ?left ventricle has normal function. The left ventricle has no regional  ?wall motion abnormalities.  ? 2. Right ventricular systolic function is normal. The right ventricular  ?size is normal.  ?  3. No left atrial/left atrial appendage thrombus was detected.  ? 4. The mitral valve is normal in structure. Trivial mitral valve  ?regurgitation. No evidence of mitral stenosis.  ? 5. The aortic valve is tricuspid. Aortic valve regurgitation is not  ?visualized. No aortic stenosis is present.  ? 6. The inferior vena cava is normal in size with greater than 50%  ?respiratory variability, suggesting right atrial pressure of 3 mmHg.  ? 7. Evidence of atrial level shunting detected by color flow Doppler.  ?Agitated saline contrast bubble study was positive with shunting observed  ?within 3-6 cardiac cycles suggestive of interatrial shunt. There is a  ?small patent foramen ovale with  ?bidirectional shunting across atrial septum.  ? ?Conclusion(s)/Recommendation(s): Normal biventricular function without  ?evidence of hemodynamically significant valvular heart disease. Findings  ?are concerning for an interatrial shunt as detailed  above. ? ?Echo Bubble Study 10/27/2021: ? 1. Left ventricular ejection fraction, by estimation, is 55%. The left  ?ventricle has normal function. The left ventricle has no regional wall  ?motion abnormalities. Terie Purser

## 2022-05-02 ENCOUNTER — Ambulatory Visit (INDEPENDENT_AMBULATORY_CARE_PROVIDER_SITE_OTHER): Payer: BC Managed Care – PPO | Admitting: Cardiovascular Disease

## 2022-05-02 DIAGNOSIS — E7800 Pure hypercholesterolemia, unspecified: Secondary | ICD-10-CM | POA: Diagnosis not present

## 2022-05-02 DIAGNOSIS — I10 Essential (primary) hypertension: Secondary | ICD-10-CM | POA: Diagnosis not present

## 2022-05-02 DIAGNOSIS — I63412 Cerebral infarction due to embolism of left middle cerebral artery: Secondary | ICD-10-CM

## 2022-05-02 NOTE — Assessment & Plan Note (Signed)
Prior stroke.  He had a PFO closure device with Dr. Copper and is doing well.  Continue clopidogrel thru 07/2022 then ASA 81mg .   ?

## 2022-05-02 NOTE — Patient Instructions (Signed)
Medication Instructions:  ?HOLD YOUR ATORVASTATIN FOR 2 WEEKS, CALL WITH UPDATE ON JOINT PAIN  ? ?*If you need a refill on your cardiac medications before your next appointment, please call your pharmacy* ? ?Lab Work: ?NONE ? ?Testing/Procedures: ?NONE ? ?Follow-Up: ?At [facility], you and your health needs are our priority.  As part of our continuing mission to provide you with exceptional heart care, we have created designated Provider Care Teams.  These Care Teams include your primary Cardiologist (physician) and Advanced Practice Providers (APPs -  Physician Assistants and Nurse Practitioners) who all work together to provide you with the care you need, when you need it. ? ?We recommend signing up for the patient portal called "MyChart".  Sign up information is provided on this After Visit Summary.  MyChart is used to connect with patients for Virtual Visits (Telemedicine).  Patients are able to view lab/test results, encounter notes, upcoming appointments, etc.  Non-urgent messages can be sent to your provider as well.   ?To learn more about what you can do with MyChart, go to [email or web address].   ? ?Your next appointment:   ?6 month(s) ? ?The format for your next appointment:   ?In Person ? ?Provider:   ?Chilton Si, MD  ? ? ? ?

## 2022-05-05 ENCOUNTER — Ambulatory Visit (HOSPITAL_COMMUNITY)
Admission: RE | Admit: 2022-05-05 | Discharge: 2022-05-05 | Disposition: A | Discharge status: Home / Self Care | Payer: BC Managed Care – PPO | Source: Ambulatory Visit | Attending: Neurology | Admitting: Neurology

## 2022-05-05 DIAGNOSIS — Q2112 Patent foramen ovale: Secondary | ICD-10-CM | POA: Insufficient documentation

## 2022-05-07 ENCOUNTER — Encounter (HOSPITAL_BASED_OUTPATIENT_CLINIC_OR_DEPARTMENT_OTHER): Payer: Self-pay | Admitting: Cardiovascular Disease

## 2022-05-07 NOTE — Assessment & Plan Note (Signed)
Blood pressure is very well-controlled.  Continue amlodipine and work on increasing exercise to at least 150 minutes weekly. ?

## 2022-05-07 NOTE — Assessment & Plan Note (Signed)
Lipids are well-controlled on atorvastatin. 

## 2022-05-09 NOTE — Progress Notes (Signed)
Carelink Summary Report / Loop Recorder 

## 2022-05-11 ENCOUNTER — Ambulatory Visit (HOSPITAL_BASED_OUTPATIENT_CLINIC_OR_DEPARTMENT_OTHER): Payer: BC Managed Care – PPO | Admitting: Cardiovascular Disease

## 2022-05-24 ENCOUNTER — Ambulatory Visit (HOSPITAL_BASED_OUTPATIENT_CLINIC_OR_DEPARTMENT_OTHER): Payer: BC Managed Care – PPO | Admitting: Cardiovascular Disease

## 2022-05-29 ENCOUNTER — Ambulatory Visit (INDEPENDENT_AMBULATORY_CARE_PROVIDER_SITE_OTHER): Payer: BC Managed Care – PPO

## 2022-05-29 DIAGNOSIS — I63412 Cerebral infarction due to embolism of left middle cerebral artery: Secondary | ICD-10-CM | POA: Diagnosis not present

## 2022-05-29 LAB — CUP PACEART REMOTE DEVICE CHECK
Date Time Interrogation Session: 20230531230859
Implantable Pulse Generator Implant Date: 20221104

## 2022-06-14 NOTE — Progress Notes (Signed)
Carelink Summary Report / Loop Recorder 

## 2022-06-15 ENCOUNTER — Encounter (HOSPITAL_BASED_OUTPATIENT_CLINIC_OR_DEPARTMENT_OTHER): Payer: Self-pay | Admitting: Cardiovascular Disease

## 2022-06-15 ENCOUNTER — Telehealth (HOSPITAL_BASED_OUTPATIENT_CLINIC_OR_DEPARTMENT_OTHER): Payer: Self-pay | Admitting: *Deleted

## 2022-06-15 DIAGNOSIS — Z5181 Encounter for therapeutic drug level monitoring: Secondary | ICD-10-CM

## 2022-06-15 NOTE — Telephone Encounter (Signed)
Dr Elmarie Shiley   During my last visit I mention that the cholesterol statin I'm taking impacts my muscles and joints. Your suggestion is to stay off the meds for a period of time to test the hypothesis. I stop taking the statin for approximately 30 days..saw significant reduction in stiffness in both muscle and joint pain particularly in left hand and fingers. Upon returning to daily consumption of the statin pain returned.  I'm have reduced dosage to every other day.  I will be visiting my primary care physician Dr Kathrynn Speed later this month for a physical, I will discuss future as well as test my cholesterol values to determine the impact of no taking and reduce dosage of the statin. Please provide insight and direction based on my findings and future approach.  Additionally from a health position I should reduce my weight. Thanks for assistance in this matter.   Scott Brandt [birth year] [phone]  Above mychart message received. Will forward to Dr Duke Salvia for review, responded to patient Dr Duke Salvia out of the office until 7/10

## 2022-06-19 MED ORDER — ROSUVASTATIN CALCIUM 10 MG PO TABS: 10.0000 mg | ORAL_TABLET | Freq: Every day | ORAL | 3 refills | Status: DC

## 2022-07-02 LAB — CUP PACEART REMOTE DEVICE CHECK
Date Time Interrogation Session: 20230703230842
Implantable Pulse Generator Implant Date: 20221104

## 2022-07-03 ENCOUNTER — Ambulatory Visit (INDEPENDENT_AMBULATORY_CARE_PROVIDER_SITE_OTHER): Payer: BC Managed Care – PPO

## 2022-07-03 DIAGNOSIS — I63412 Cerebral infarction due to embolism of left middle cerebral artery: Secondary | ICD-10-CM | POA: Diagnosis not present

## 2022-07-10 ENCOUNTER — Other Ambulatory Visit: Payer: Self-pay | Admitting: Cardiovascular Disease

## 2022-07-10 NOTE — Telephone Encounter (Signed)
Rx(s) sent to pharmacy electronically.  

## 2022-07-23 ENCOUNTER — Encounter: Payer: Self-pay | Admitting: Cardiovascular Disease

## 2022-07-26 ENCOUNTER — Encounter (HOSPITAL_BASED_OUTPATIENT_CLINIC_OR_DEPARTMENT_OTHER): Payer: Self-pay | Admitting: Cardiovascular Disease

## 2022-07-26 NOTE — Telephone Encounter (Signed)
Please advise 

## 2022-08-02 ENCOUNTER — Encounter: Payer: Self-pay | Admitting: Cardiovascular Disease

## 2022-08-02 LAB — CUP PACEART REMOTE DEVICE CHECK
Date Time Interrogation Session: 20230805230832
Implantable Pulse Generator Implant Date: 20221104

## 2022-08-02 NOTE — Progress Notes (Signed)
Carelink Summary Report / Loop Recorder 

## 2022-08-02 NOTE — Telephone Encounter (Signed)
Hey did you print this one?

## 2022-08-03 ENCOUNTER — Other Ambulatory Visit: Payer: Self-pay | Admitting: Gastroenterology

## 2022-08-03 DIAGNOSIS — R1310 Dysphagia, unspecified: Secondary | ICD-10-CM

## 2022-08-07 ENCOUNTER — Ambulatory Visit: Payer: BC Managed Care – PPO

## 2022-08-08 ENCOUNTER — Ambulatory Visit
Admission: RE | Admit: 2022-08-08 | Discharge: 2022-08-08 | Disposition: A | Discharge status: Home / Self Care | Payer: BC Managed Care – PPO | Source: Ambulatory Visit | Attending: Gastroenterology | Admitting: Gastroenterology

## 2022-08-08 DIAGNOSIS — R1310 Dysphagia, unspecified: Secondary | ICD-10-CM

## 2022-09-05 NOTE — Telephone Encounter (Signed)
Here ya go!  

## 2022-09-11 ENCOUNTER — Ambulatory Visit (INDEPENDENT_AMBULATORY_CARE_PROVIDER_SITE_OTHER): Payer: BC Managed Care – PPO

## 2022-09-11 DIAGNOSIS — I63412 Cerebral infarction due to embolism of left middle cerebral artery: Secondary | ICD-10-CM

## 2022-09-12 LAB — CUP PACEART REMOTE DEVICE CHECK
Date Time Interrogation Session: 20230917231248
Implantable Pulse Generator Implant Date: 20221104

## 2022-09-23 NOTE — Progress Notes (Signed)
Carelink Summary Report / Loop Recorder 

## 2022-09-28 ENCOUNTER — Telehealth: Payer: Self-pay | Admitting: Cardiovascular Disease

## 2022-09-28 DIAGNOSIS — Z0279 Encounter for issue of other medical certificate: Secondary | ICD-10-CM

## 2022-09-28 NOTE — Telephone Encounter (Signed)
Form and payment received.  Form in Dr. Antionette Char box.

## 2022-10-04 ENCOUNTER — Telehealth: Payer: Self-pay | Admitting: Neurology

## 2022-10-04 NOTE — Telephone Encounter (Signed)
I called and spoke with pt. Advised this appt is scheduled for his 6 month f/u. Pt agreeable to keep f/u but requested sooner appt moved to 10/10/2022.

## 2022-10-04 NOTE — Telephone Encounter (Signed)
Patient was called to remind him of 10/19 office visit for 75mo FU. He wanted to speak to a nurse advising what this appt is for and if it is really needed.

## 2022-10-09 NOTE — Telephone Encounter (Signed)
Critical illness form faxed to [facility].  Both the patient and billing have been notified,

## 2022-10-10 ENCOUNTER — Encounter: Payer: Self-pay | Admitting: Neurology

## 2022-10-10 ENCOUNTER — Ambulatory Visit (INDEPENDENT_AMBULATORY_CARE_PROVIDER_SITE_OTHER): Payer: BC Managed Care – PPO | Admitting: Neurology

## 2022-10-10 VITALS — BP 150/96 | HR 54 | Ht 72.0 in | Wt 233.6 lb

## 2022-10-10 DIAGNOSIS — I639 Cerebral infarction, unspecified: Secondary | ICD-10-CM | POA: Diagnosis not present

## 2022-10-10 DIAGNOSIS — I10 Essential (primary) hypertension: Secondary | ICD-10-CM

## 2022-10-10 DIAGNOSIS — E782 Mixed hyperlipidemia: Secondary | ICD-10-CM | POA: Diagnosis not present

## 2022-10-10 NOTE — Progress Notes (Signed)
[name] [address] [place]. [place] [address] [phone]       OFFICE FOLLOW UPVISIT NOTE  [name] Date of Birth:  08-02-1961 Medical Record Number:  [id number]   Referring MD:  Rosalin Hawking  Reason for Referral:  Stroke and PFO  HPI: Initial visit 11/10/2021:[name] is a pleasant 61 year old African-American male seen today for initial office consultation visit for stroke.  History is obtained from patient and his wife is accompanying him today as well as review of electronic medical records and I personally reviewed pertinent available imaging films in PACS.  He has past medical history of hypertension, hyperlipidemia, and asthma.  He presented on 10/26/2021 for sudden onset of expressive aphasia with speech difficulties and right facial droop.  He had earlier been on a Zoom call all morning and was fine.  He blew some leaves from his driveway and subsequently had lunch and went upstairs for another meeting.  He is started not feeling well around 1:00 and then came down to his wife and while trying to speak he could not get his words together.  Wife noticed right lower facial droop.  Patient his wife to him to the emergency room.  A code stroke was activated and he was seen by telestroke and found to have NIH stroke scale of 3 with expressive aphasia, dysarthria and right facial droop.  He was given IV TNK and transferred to [facility] where his symptoms subsequently resolved.  MRI scan of the brain showed tiny scattered left frontal cortical infarcts.  2D echo showed normal ejection fraction and wall bubble study was positive for PFO.  Subsequently TEE was obtained which also confirmed a small PFO but no atrial septal aneurysm clot was noted.  LDL cholesterol 60 mg percent and hemoglobin A1c was 6.2.  Hypercoagulable lab work including anticardiolipin antibodies, beta-2 glycoprotein's, lupus anticoagulant and homocystine were all normal.  Patient was  started on dual antiplatelet therapy aspirin Plavix for 3 weeks followed by aspirin alone.  Patient states is done well since discharge.  He has had no recurrent speech difficulties or any other TIA or stroke symptoms.  He saw cardiologist Dr. Oval Linsey recently was referred him to Dr. Burt Knack for PFO closure.  Patient states he had a somewhat similar episode in 2007 when he had expressive aphasia episode.  He was treated at the [facility] in [place] with IV tPA and with full resolution of his symptoms.  Those records are not available for my review but patient was told it was a TIA so I presume the MRI did not show definite stroke.  Patient states subsequently had a polysomnogram done as an outpatient and was not found to have sleep apnea.  Patient is back to work without restrictions.  He has no complaints today.  He has been reading about PFO and has a lot of questions about whether he is a candidate for PFO closure or not.  He denies any history of deep vein thrombosis, pulmonary embolism, stroke or heart attacks at a young age in his family.  He had loop recorder inserted on 11/0/22 and so far paroxysmal A. fib has not yet been found.  He denies history of palpitations, syncope or cardiac arrhythmias. Update 04/11/2022 : He returns for follow-up after last visit 5 months ago.  He had TCD bubble study done on 12/02/2021 which was strongly positive with the Carton sign indicating of a large right-to-left shunt.  He saw Dr. Burt Knack  and an elective endovascular PFO closure on 01/30/2022 uneventfully and has done well since then.  He has had no recurrent stroke or TIA symptoms.  He is on Plavix 75 mg daily which is tolerating well without bruising or bleeding.  His blood pressure is under good control today it is 132/89.  Remains on Lipitor which is tolerating well without muscle aches and pains.  He complains of occasional cognitive slowing or any sitting in board meetings and he cannot remember things but he  can remember them later.  On Mini-Mental status exam testing today scored 30/30 and on MoCA testing scored 28/30.  He has no complaints today otherwise. Update 10/10/2022 : He returns for follow-up after last visit 6 months ago.  Continues to do well without recurrent stroke or TIA symptoms.  He did have follow-up transcranial Doppler bubble study on 05/05/2022 which was positive for only trivial residual right to left shunt.  Has now stopped the Plavix and is taking aspirin 81 mg daily which he is tolerating well without bruising or bleeding.  Loop recorder and last evaluation on 09/10/2022 did not show paroxysmal A-fib yet.  He has switched from Plavix to aspirin which he is tolerating well.  He has switched from Crestor to Lipitor 20 mg daily has not had any follow-up lipid profile checked yet.  He has no new complaints today. ROS:   14 system review of systems is positive for trouble thinking  , tiredness and fatigue and all other systems negative  PMH:  Past Medical History:  Diagnosis Date   Asthma    signs of asthma from Dr Sherene Sires    Asthma    Essential hypertension 02/24/2020   Hypercholesteremia    Left ventricular hypertrophy 02/24/2020   Lightheadedness 12/15/2020   TIA (transient ischemic attack) 2007   Urticaria     Social History:  Social History   Socioeconomic History   Marital status: Married    Spouse name: Not on file   Number of children: Not on file   Years of education: Not on file   Highest education level: Not on file  Occupational History   Not on file  Tobacco Use   Smoking status: Never   Smokeless tobacco: Never  Vaping Use   Vaping Use: Never used  Substance and Sexual Activity   Alcohol use: Yes    Alcohol/week: 0.0 standard drinks of alcohol    Comment: social   Drug use: No   Sexual activity: Yes    Partners: Female    Birth control/protection: None  Other Topics Concern   Not on file  Social History Narrative   ** Merged History Encounter **        Social Determinants of Health   Financial Resource Strain: Not on file  Food Insecurity: Not on file  Transportation Needs: Not on file  Physical Activity: Not on file  Stress: Not on file  Social Connections: Not on file  Intimate Partner Violence: Not on file    Medications:   Current Outpatient Medications on File Prior to Visit  Medication Sig Dispense Refill   amLODipine (NORVASC) 10 MG tablet TAKE 1 TABLET DAILY 90 tablet 2   aspirin EC 81 MG tablet Take 81 mg by mouth daily. Swallow whole.     atorvastatin (LIPITOR) 20 MG tablet Take 20 mg by mouth daily.     fexofenadine (ALLEGRA) 180 MG tablet Take 180 mg by mouth daily as needed for allergies.     amoxicillin (  AMOXIL) 500 MG capsule TAKE 4 CAPS BY MOUTH 1 HR PRIOR TO DENTAL APPT (Patient not taking: Reported on 04/11/2022)     No current facility-administered medications on file prior to visit.    Allergies:   Allergies  Allergen Reactions   Iodine Anaphylaxis   Shellfish Allergy Anaphylaxis   Crestor [Rosuvastatin] Other (See Comments)    Muscle aches    Physical Exam General: Mildly obese middle-aged African-American male, seated, in no evident distress Head: head normocephalic and atraumatic.   Neck: supple with no carotid or supraclavicular bruits Cardiovascular: regular rate and rhythm, no murmurs Musculoskeletal: no deformity Skin:  no rash/petichiae Vascular:  Normal pulses all extremities  Neurologic Exam Mental Status: Awake and fully alert. Oriented to place and time. Recent and remote memory intact. Attention span, concentration and fund of knowledge appropriate. Mood and affect appropriate.  Cranial Nerves: Fundoscopic exam not done pupils equal, briskly reactive to light. Extraocular movements full without nystagmus. Visual fields full to confrontation. Hearing intact. Facial sensation intact. Face, tongue, palate moves normally and symmetrically.  Motor: Normal bulk and tone. Normal strength in  all tested extremity muscles. Sensory.: intact to touch , pinprick , position and vibratory sensation.  Coordination: Rapid alternating movements normal in all extremities. Finger-to-nose and heel-to-shin performed accurately bilaterally. Gait and Station: Arises from chair without difficulty. Stance is normal. Gait demonstrates normal stride length and balance . Able to heel, toe and tandem walk without difficulty.  Reflexes: 1+ and symmetric. Toes downgoing.   NIHSS 0 Modified Rankin 1   ASSESSMENT: 61 year old African-American male with expressive aphasia and right facial droop secondary to left MCA branch embolic infarct in November 2022 of cryptogenic etiology.  Vascular risk factors of hypertension, hyperlipidemia, obesity and   PFO s/p endovascular PFO closure 01/30/2022.  He is doing well from neurovascular standpoint      PLAN:I had a long d/w patient about his recent  cryptogenic stroke, risk for recurrent stroke/TIAs, personally independently reviewed imaging studies and stroke evaluation results and answered questions.Continue aspirin 81 mg daily  for secondary stroke prevention and maintain strict control of hypertension with blood pressure goal below 130/90, diabetes with hemoglobin A1c goal below 6.5% and lipids with LDL cholesterol goal below 70 mg/dL. I also advised the patient to eat a healthy diet with plenty of whole grains, cereals, fruits and vegetables, exercise regularly and maintain ideal body weight Check f/u lipid panel and HbA1c today.  I advised him to see his primary care physician to have his blood pressure checked and optimal blood pressure management.followup in the future with me only as needed. Greater than 50% time during this 35-minute   visit was spent on counseling and coordination of care and extensive discussion about cryptogenic stroke and PFO and discussion about role of PFO closure and answering questions and discussion with patient and wife Delia Heady,  MD Note: This document was prepared with digital dictation and possible smart phrase technology. Any transcriptional errors that result from this process are unintentional.

## 2022-10-10 NOTE — Patient Instructions (Signed)
I had a long d/w patient about his recent  cryptogenic stroke, risk for recurrent stroke/TIAs, personally independently reviewed imaging studies and stroke evaluation results and answered questions.Continue aspirin 81 mg daily  for secondary stroke prevention and maintain strict control of hypertension with blood pressure goal below 130/90, diabetes with hemoglobin A1c goal below 6.5% and lipids with LDL cholesterol goal below 70 mg/dL. I also advised the patient to eat a healthy diet with plenty of whole grains, cereals, fruits and vegetables, exercise regularly and maintain ideal body weight Check f/u lipid panel and HbA1c today.  I advised him to see his primary care physician to have his blood pressure checked and optimal blood pressure management.followup in the future with me only as needed.

## 2022-10-11 LAB — LIPID PANEL
Chol/HDL Ratio: 2.3 ratio (ref 0.0–5.0)
Cholesterol, Total: 162 mg/dL (ref 100–199)
HDL: 72 mg/dL
LDL Chol Calc (NIH): 78 mg/dL (ref 0–99)
Triglycerides: 59 mg/dL (ref 0–149)
VLDL Cholesterol Cal: 12 mg/dL (ref 5–40)

## 2022-10-11 LAB — HEMOGLOBIN A1C
Est. average glucose Bld gHb Est-mCnc: 131 mg/dL
Hgb A1c MFr Bld: 6.2 % — ABNORMAL HIGH (ref 4.8–5.6)

## 2022-10-12 ENCOUNTER — Ambulatory Visit: Payer: BC Managed Care – PPO | Admitting: Neurology

## 2022-10-16 ENCOUNTER — Ambulatory Visit (INDEPENDENT_AMBULATORY_CARE_PROVIDER_SITE_OTHER): Payer: BC Managed Care – PPO

## 2022-10-16 DIAGNOSIS — I63412 Cerebral infarction due to embolism of left middle cerebral artery: Secondary | ICD-10-CM | POA: Diagnosis not present

## 2022-10-17 LAB — CUP PACEART REMOTE DEVICE CHECK
Date Time Interrogation Session: 20231022231552
Implantable Pulse Generator Implant Date: 20221104

## 2022-10-23 ENCOUNTER — Telehealth: Payer: Self-pay

## 2022-10-23 ENCOUNTER — Other Ambulatory Visit: Payer: Self-pay | Admitting: Neurology

## 2022-10-23 MED ORDER — ATORVASTATIN CALCIUM 20 MG PO TABS: 40.0000 mg | ORAL_TABLET | Freq: Every day | ORAL | 1 refills | Status: DC

## 2022-10-23 NOTE — Telephone Encounter (Signed)
-----   Message from Garvin Fila, MD sent at 10/23/2022  9:08 AM EDT ----- Mitchell Heir inform the patient that screening test for diabetes was borderline but acceptable and stable from a year ago.  Lipid profile was mostly acceptable but bad cholesterol has gone up and I recommend he increase the dose of Lipitor to 40 mg daily

## 2022-10-23 NOTE — Progress Notes (Signed)
Kindly inform the patient that screening test for diabetes was borderline but acceptable and stable from a year ago.  Lipid profile was mostly acceptable but bad cholesterol has gone up and I recommend he increase the dose of Lipitor to 40 mg daily

## 2022-10-23 NOTE — Telephone Encounter (Signed)
I called and spoke with the patient. He verbalized understanding of the results. He stated he currently takes Lipitor 20 mg every other day, he was not agreeable to changing the dosage of Lipitor to 40 mg daily. He stated instead he will take Lipitor 20 mg daily.

## 2022-11-03 ENCOUNTER — Encounter (HOSPITAL_BASED_OUTPATIENT_CLINIC_OR_DEPARTMENT_OTHER): Payer: Self-pay | Admitting: Cardiovascular Disease

## 2022-11-03 ENCOUNTER — Telehealth: Payer: Self-pay | Admitting: Cardiovascular Disease

## 2022-11-03 ENCOUNTER — Ambulatory Visit (INDEPENDENT_AMBULATORY_CARE_PROVIDER_SITE_OTHER): Payer: BC Managed Care – PPO | Admitting: Cardiovascular Disease

## 2022-11-03 VITALS — BP 136/84 | HR 55 | Ht 72.0 in | Wt 232.6 lb

## 2022-11-03 DIAGNOSIS — I63412 Cerebral infarction due to embolism of left middle cerebral artery: Secondary | ICD-10-CM

## 2022-11-03 DIAGNOSIS — E7800 Pure hypercholesterolemia, unspecified: Secondary | ICD-10-CM | POA: Diagnosis not present

## 2022-11-03 DIAGNOSIS — I10 Essential (primary) hypertension: Secondary | ICD-10-CM | POA: Diagnosis not present

## 2022-11-03 NOTE — Telephone Encounter (Signed)
Pt returned call to office. Just wanted specifics of what was placed on form. States he only filed one critical illness claim when he had the stroke and didn't know he was supposed to place a second for the PFO closure encounter. [employer] states they haven't received paperwork, but he believes they've attached it to the stroke encounter. He is going to check with them to see if he truly has to file a separate claim or not for the PFO and will let us know if he needs additional paperwork.

## 2022-11-03 NOTE — Telephone Encounter (Signed)
Left message for patient to call back as we haven't heard anything since sending the requested paperwork into [employer].

## 2022-11-03 NOTE — Telephone Encounter (Signed)
Pt calling asking for an update on critical illness paperwork faxed over to [employer] (phone note 09/28/22)

## 2022-11-03 NOTE — Assessment & Plan Note (Addendum)
Diastolic blood pressure elevated today.  Blood pressure has been mostly in the 130s and occasionally 120s at home.  He has been exercising regularly.  Diet is up and down.  He is going to work on being more consistent with his diet and limiting sodium intake.  Check blood pressures twice a day and send the results in 2 weeks.  He understands that we may need to add additional agent if his blood pressure is not less than 130/80.

## 2022-11-03 NOTE — Progress Notes (Signed)
Cardiology Office Note   Date:  11/03/2022   ID:  Scott Pullingric L Colston, DOB 07/08/1961, MRN [id number]  PCP:  Karle PlumberArvind, Moogali M, MD  Cardiologist:  Chilton Siiffany [place], MD  Electrophysiologist:  None   Evaluation Performed:  Follow-Up Visit  Chief Complaint:  hypertension  History of Present Illness:    Scott Brandt is a 61 y.o. male with hyperlipidemia, asthma, anxiety and TIA (2007) who presents for follow up.  He was initially seen 09/2017 to establish care.  He reported palpitations only occur in the setting of stressful situations.  The palpitations subside after the stressful stimulus is over.  He exercises 4 times per week and has no exertional symptoms.  No further testing was performed.  He was very hypertensive in clinic.  It was unclear if this was due to anxiety.  He was started on amlodipine 2.5 mg daily and asked to check his blood pressure at home.  He also had a coronary calcium score that showed one area of calcium in the left anterior descending.  His calcium score was 4, which is 44th percentile for age and gender.  It was recommended that he start taking his atorvastatin daily.   Scott Brandt stopped his amlodipine and was trying to control his BP with diet and exercise.  However his blood pressure has been quite elevated.  At his last appointment amlodipine was added at 5 mg daily.  His blood pressure continues to be elevated so amlodipine was increased to 10 mg.  He had an echocardiogram 02/11/2020 that revealed LVEF 55 to 60% with grade 1 diastolic dysfunction.  He had moderate LVH.  The echo was otherwise unremarkable.  He had a repeat echo 07/2020 that revealed LVEF 55 to 60% with grade 1 diastolic dysfunction and mild LVH.  Scott Brandt blood pressure remained a little elevated.  He wanted to work on diet and exercise prior to putting hydrochlorothiazide. He was admitted 10/2021 with a stroke. He presented with expressive aphasia and a right facial droop. He received TNK with near  resolution of symptoms He had a TEE with LVEF 60-65% and a small PFO. A loop recorder was inserted. Aspirin was switched to clopidogrel.   He saw Dr. Excell Seltzerooper and underwent PFO closure using an Amplatzer 25 mm PFO occluder device in 01/2022. He stated that he was feeling fine with no issues regarding his loop recorder. Since his PFO closure, he noticed that his blood pressure was a lot lower than it used to be, possibly due to lower stress levels. He held his statin due to myalgias. He then switched it to every other day.   Today, he appears to be well. He states that sometimes when he goes up stairs he will have to catch his breath, but he does not believe it is true shortness of breath. He regains his breath quickly and goes up stairs faster than most people, which he believes is the cause. He reports that he is a little stressed recently due to beginning a new job. His blood pressure in clinics is usually elevated around 145-150/100.  At home he will usually see readings around 120-130 systolic. He states that he is not typically able to check his blood pressure during the day and instead tends to check it at night. In regards to exercise, he is still being routinely active and feeling well. He believes his diet is unchanged. He has fish, chicken, and steak fairly frequently. When cooking at home, he and his wife  do not use much salt. His LDL as of February 2023 was 63. He endorses his rosuvastatin was not helping and was actually making him worse, so he stopped and switched to atorvastatin. He denies any palpitations, chest pain, shortness of breath, or peripheral edema. No lightheadedness, headaches, syncope, orthopnea, or PND.  Past Medical History:  Diagnosis Date   Asthma    signs of asthma from Dr Sherene Sires    Asthma    Essential hypertension 02/24/2020   Hypercholesteremia    Left ventricular hypertrophy 02/24/2020   Lightheadedness 12/15/2020   TIA (transient ischemic attack) 2007   Urticaria     Past Surgical History:  Procedure Laterality Date   BUBBLE STUDY  10/28/2021   Procedure: BUBBLE STUDY;  Surgeon: Chilton Si, MD;  [facility] ENDOSCOPY;  Service: Cardiovascular;;   FEMUR IM NAIL Left 02/28/2016   Procedure: INTRAMEDULLARY (IM) NAIL FEMORAL;  Surgeon: Durene Romans, MD;  Location: [facility] OR;  Service: Orthopedics;  Laterality: Left;   LOOP RECORDER INSERTION N/A 10/28/2021   Procedure: LOOP RECORDER INSERTION;  Surgeon: Regan Lemming, MD;  Location: [facility] INVASIVE CV LAB;  Service: Cardiovascular;  Laterality: N/A;   PATENT FORAMEN OVALE(PFO) CLOSURE N/A 01/30/2022   Procedure: PATENT FORAMEN OVALE (PFO) CLOSURE;  Surgeon: Tonny Bollman, MD;  Location: [facility] INVASIVE CV LAB;  Service: Cardiovascular;  Laterality: N/A;   TEE WITHOUT CARDIOVERSION N/A 10/28/2021   Procedure: TRANSESOPHAGEAL ECHOCARDIOGRAM (TEE);  Surgeon: Chilton Si, MD;  Location: [facility] ENDOSCOPY;  Service: Cardiovascular;  Laterality: N/A;     Current Meds  Medication Sig   amLODipine (NORVASC) 10 MG tablet TAKE 1 TABLET DAILY   aspirin EC 81 MG tablet Take 81 mg by mouth daily. Swallow whole.   atorvastatin (LIPITOR) 20 MG tablet Take 20 mg by mouth daily.   Coenzyme Q10 (CO Q 10) 10 MG CAPS Take 1 capsule by mouth daily.   fexofenadine (ALLEGRA) 180 MG tablet Take 180 mg by mouth daily as needed for allergies.   VITAMIN D, CHOLECALCIFEROL, PO Take 1 tablet by mouth daily.     Allergies:   Iodine, Shellfish allergy, and Crestor [rosuvastatin]   Social History   Tobacco Use   Smoking status: Never   Smokeless tobacco: Never  Vaping Use   Vaping Use: Never used  Substance Use Topics   Alcohol use: Yes    Alcohol/week: 0.0 standard drinks of alcohol    Comment: social   Drug use: No     Family Hx: The patient's family history includes Allergic rhinitis in his father; Cancer in his sister; Diabetes in his sister; Hyperlipidemia in his brother and mother; Hypertension in his brother,  mother, and sister; Rheum arthritis in his sister; Stroke in his maternal grandmother. There is no history of Angioedema, Asthma, Atopy, Eczema, Immunodeficiency, or Urticaria.  ROS:   Please see the history of present illness.    (+) Stress  All other systems reviewed and are negative.   Prior CV studies:   The following studies were reviewed today:  Echo 01/30/2022:  1. Amplatzer 25 mm PFO occluder device placed on 01/30/2022. No residual  shunt by color flow Doppler.   2. Left ventricular ejection fraction, by estimation, is 60 to 65%. The  left ventricle has normal function.   3. Trivial mitral valve regurgitation.  Patent Foramen Ovale Closure 01/30/2022: Successful transcatheter PFO closure procedure using an Amplatzer 25 mm PFO occluder device under intracardiac echo guidance   Recommend: Clopidogrel 75 mg daily at least 6  months Same day limited echo with planned DC if no complications arise SBE prophylaxis x 6 months (see DC instructions)  Loop Recorder Insertion 10/28/2021: CONCLUSIONS:   1. Successful implantation of a [employer] Reveal LINQ implantable loop recorder for cryptogenic stroke  2. No early apparent complications.   LE Venous DVT 10/28/2021: Summary:  BILATERAL:  - No evidence of deep vein thrombosis seen in the lower extremities,  bilaterally.  -No evidence of popliteal cyst, bilaterally.  Echo TEE 10/28/2021:  1. Left ventricular ejection fraction, by estimation, is 60 to 65%. The  left ventricle has normal function. The left ventricle has no regional  wall motion abnormalities.   2. Right ventricular systolic function is normal. The right ventricular  size is normal.   3. No left atrial/left atrial appendage thrombus was detected.   4. The mitral valve is normal in structure. Trivial mitral valve  regurgitation. No evidence of mitral stenosis.   5. The aortic valve is tricuspid. Aortic valve regurgitation is not  visualized. No aortic stenosis is  present.   6. The inferior vena cava is normal in size with greater than 50%  respiratory variability, suggesting right atrial pressure of 3 mmHg.   7. Evidence of atrial level shunting detected by color flow Doppler.  Agitated saline contrast bubble study was positive with shunting observed  within 3-6 cardiac cycles suggestive of interatrial shunt. There is a  small patent foramen ovale with  bidirectional shunting across atrial septum.   Conclusion(s)/Recommendation(s): Normal biventricular function without  evidence of hemodynamically significant valvular heart disease. Findings  are concerning for an interatrial shunt as detailed above.  Echo Bubble Study 10/27/2021:  1. Left ventricular ejection fraction, by estimation, is 55%. The left  ventricle has normal function. The left ventricle has no regional wall  motion abnormalities. There is mild left ventricular hypertrophy. Left  ventricular diastolic parameters are  consistent with Grade I diastolic dysfunction (impaired relaxation).   2. Right ventricular systolic function is normal. The right ventricular  size is normal. Tricuspid regurgitation signal is inadequate for assessing  PA pressure.   3. The mitral valve is normal in structure. Trivial mitral valve  regurgitation. No evidence of mitral stenosis.   4. The aortic valve is tricuspid. Aortic valve regurgitation is not  visualized. No aortic stenosis is present.   5. The inferior vena cava is normal in size with greater than 50%  respiratory variability, suggesting right atrial pressure of 3 mmHg.   6. Positive bubble study, possible PFO.   CTA Head/Neck 10/26/2021:   IMPRESSION: Normal CT angiography of the head and neck. No large vessel occlusion. No proximal stenosis. No carotid bifurcation disease.  Echo 07/2020: 1. Left ventricular ejection fraction, by estimation, is 55 to 60%. The  left ventricle has normal function. The left ventricle has no regional  wall  motion abnormalities. There is mild left ventricular hypertrophy.  Left ventricular diastolic parameters  are consistent with Grade I diastolic dysfunction (impaired relaxation).   2. Right ventricular systolic function is normal. The right ventricular  size is mildly enlarged. Tricuspid regurgitation signal is inadequate for  assessing PA pressure.   3. Right atrial size was mildly dilated.   4. The mitral valve is normal in structure. No evidence of mitral valve  regurgitation. No evidence of mitral stenosis.   5. The aortic valve is tricuspid. Aortic valve regurgitation is not  visualized. No aortic stenosis is present.   6. The inferior vena cava is normal in size  with greater than 50%  respiratory variability, suggesting right atrial pressure of 3 mmHg.   Echo 02/11/20:  1. Left ventricular ejection fraction, by estimation, is 55 to 60%. The  left ventricle has normal function. The left ventricle has no regional  wall motion abnormalities. There is moderate concentric left ventricular  hypertrophy. Left ventricular  diastolic parameters are consistent with Grade I diastolic dysfunction  (impaired relaxation). Elevated left atrial pressure.   2. Right ventricular systolic function is normal. The right ventricular  size is normal. There is normal pulmonary artery systolic pressure. The  estimated right ventricular systolic pressure is 19.8 mmHg.   3. Left atrial size was moderately dilated.   4. The mitral valve is normal in structure and function. Trivial mitral  valve regurgitation. No evidence of mitral stenosis.   5. The aortic valve is normal in structure and function. Aortic valve  regurgitation is not visualized. Mild aortic valve sclerosis is present,  with no evidence of aortic valve stenosis.   6. The inferior vena cava is normal in size with greater than 50%  respiratory variability, suggesting right atrial pressure of 3 mmHg.    Labs/Other Tests and Data Reviewed:     EKG:  EKG is personally reviewed. 11/03/22: Sinus bradycardia. Rate 55 bpm. PVC. 05/02/2022: EKG was not ordered. 11/09/2021: Sinus rhythm. Rate 63 bpm. IVCD. 08/20/2020: Sinus bradycardia. Rate 55 bpm. Nonspecific T wave abnormalities.  Recent Labs: 01/13/2022: BUN 9; Creatinine, Ser 1.16; Hemoglobin 14.6; Platelets 274; Potassium 4.0; Sodium 138   Recent Lipid Panel Lab Results  Component Value Date/Time   CHOL 162 10/10/2022 10:53 AM   TRIG 59 10/10/2022 10:53 AM   HDL 72 10/10/2022 10:53 AM   CHOLHDL 2.3 10/10/2022 10:53 AM   CHOLHDL 2.3 10/27/2021 03:42 AM   LDLCALC 78 10/10/2022 10:53 AM    Wt Readings from Last 3 Encounters:  11/03/22 232 lb 9.6 oz (105.5 kg)  10/10/22 233 lb 9.6 oz (106 kg)  05/02/22 240 lb 12.8 oz (109.2 kg)     Objective:    VS:  BP 136/84 (BP Location: Left Arm, Patient Position: Sitting, Cuff Size: Normal)   Pulse (!) 55   Ht 6' (1.829 m)   Wt 232 lb 9.6 oz (105.5 kg)   BMI 31.55 kg/m  , BMI Body mass index is 31.55 kg/m. GENERAL:  Well appearing HEENT: Pupils equal round and reactive, fundi not visualized, oral mucosa unremarkable NECK:  No jugular venous distention, waveform within normal limits, carotid upstroke brisk and symmetric, no bruits, no thyromegaly HEART:  RRR.  PMI not displaced or sustained,S1 and S2 within normal limits, no S3, no S4, no clicks, no rubs, no murmurs ABD:  Flat, positive bowel sounds normal in frequency in pitch, no bruits, no rebound, no guarding, no midline pulsatile mass, no hepatomegaly, no splenomegaly EXT:  2 plus pulses throughout, no edema, no cyanosis no clubbing SKIN:  No rashes no nodules NEURO:  Cranial nerves II through XII grossly intact, motor grossly intact throughout PSYCH:  Cognitively intact, oriented to person place and time   ASSESSMENT & PLAN:    Essential hypertension Diastolic blood pressure elevated today.  Blood pressure has been mostly in the 130s and occasionally 120s at home.  He  has been exercising regularly.  Diet is up and down.  He is going to work on being more consistent with his diet and limiting sodium intake.  Check blood pressures twice a day and send the results in 2 weeks.  He understands that we may need to add additional agent if his blood pressure is not less than 130/80.  Pure hypercholesterolemia Lipids were uncontrolled on atorvastatin.  He didn't tolerate higher doses.  He will take the atorvastatin 20 mg every day.  Prior he was taking it every other day.  Repeat lipids and a CMP in 2 to 3 months.  If he develops recurrent myalgias we discussed adding Repatha or another agent.  Stroke (cerebrum) [facility]) S/p PFO closure device. Continue aspirin, atorvastatin and BP control.    Medication Adjustments/Labs and Tests Ordered: Current medicines are reviewed at length with the patient today.  Concerns regarding medicines are outlined above.   Tests Ordered: Orders Placed This Encounter  Procedures   Lipid panel   Comprehensive metabolic panel   EKG 12-Lead   Medication Changes: No orders of the defined types were placed in this encounter.   Disposition: FU with Derran Sear C. Duke Salvia, MD, [facility] in 3-4 months.   Time spent: 42 minutes-Greater than 50% of this time was spent in counseling, explanation of diagnosis, planning of further management, and coordination of care.   I,Breanna Adamick,acting as a scribe for Chilton Si, MD.,have documented all relevant documentation on the behalf of Chilton Si, MD,as directed by  Chilton Si, MD while in the presence of Chilton Si, MD.   I, Josten Warmuth C. Duke Salvia, MD have reviewed all documentation for this visit.  The documentation of the exam, diagnosis, procedures, and orders on 11/03/2022 are all accurate and complete.  Signed, Chilton Si, MD  11/03/2022 10:32 AM     [facility]

## 2022-11-03 NOTE — Patient Instructions (Addendum)
Medication Instructions:  Atovastatin 20 mg daily *If you need a refill on your cardiac medications before your next appointment, please call your pharmacy*   Lab Work: Lipids CMP In 2 to 3 months If you have labs (blood work) drawn today and your tests are completely normal, you will receive your results only by: MyChart Message (if you have MyChart) OR A paper copy in the mail If you have any lab test that is abnormal or we need to change your treatment, we will call you to review the results.   Testing/Procedures: None   Follow-Up: At [facility], you and your health needs are our priority.  As part of our continuing mission to provide you with exceptional heart care, we have created designated Provider Care Teams.  These Care Teams include your primary Cardiologist (physician) and Advanced Practice Providers (APPs -  Physician Assistants and Nurse Practitioners) who all work together to provide you with the care you need, when you need it.  We recommend signing up for the patient portal called "MyChart".  Sign up information is provided on this After Visit Summary.  MyChart is used to connect with patients for Virtual Visits (Telemedicine).  Patients are able to view lab/test results, encounter notes, upcoming appointments, etc.  Non-urgent messages can be sent to your provider as well.   To learn more about what you can do with MyChart, go to [email or web address].    Your next appointment:   3 month(s) to 4 months  The format for your next appointment:   In Person  Provider:   Chilton Si, MD    Other Instructions Check Blood pressure twice daily for 2 weeks and send to Dr. Duke Salvia via My Chart

## 2022-11-03 NOTE — Telephone Encounter (Signed)
Patient is following up regarding critical illness paperwork. He is requesting to speak with someone who is able to confirm a diagnosis date, and initial procedure date when his call is returned.

## 2022-11-03 NOTE — Assessment & Plan Note (Signed)
Lipids were uncontrolled on atorvastatin.  He didn't tolerate higher doses.  He will take the atorvastatin 20 mg every day.  Prior he was taking it every other day.  Repeat lipids and a CMP in 2 to 3 months.  If he develops recurrent myalgias we discussed adding Repatha or another agent.

## 2022-11-03 NOTE — Assessment & Plan Note (Signed)
S/p PFO closure device. Continue aspirin, atorvastatin and BP control.

## 2022-11-07 NOTE — Progress Notes (Signed)
Carelink Summary Report / Loop Recorder 

## 2022-11-20 ENCOUNTER — Ambulatory Visit (INDEPENDENT_AMBULATORY_CARE_PROVIDER_SITE_OTHER): Payer: BC Managed Care – PPO

## 2022-11-20 DIAGNOSIS — I63412 Cerebral infarction due to embolism of left middle cerebral artery: Secondary | ICD-10-CM

## 2022-11-22 LAB — CUP PACEART REMOTE DEVICE CHECK
Date Time Interrogation Session: 20231128231118
Implantable Pulse Generator Implant Date: 20221104

## 2022-12-27 ENCOUNTER — Ambulatory Visit (INDEPENDENT_AMBULATORY_CARE_PROVIDER_SITE_OTHER): Payer: 59

## 2022-12-27 DIAGNOSIS — I63412 Cerebral infarction due to embolism of left middle cerebral artery: Secondary | ICD-10-CM | POA: Diagnosis not present

## 2022-12-27 LAB — CUP PACEART REMOTE DEVICE CHECK
Date Time Interrogation Session: 20240102231003
Implantable Pulse Generator Implant Date: 20221104

## 2023-01-01 NOTE — Progress Notes (Signed)
Carelink Summary Report / Loop Recorder 

## 2023-01-06 ENCOUNTER — Other Ambulatory Visit: Payer: Self-pay | Admitting: Neurology

## 2023-01-16 ENCOUNTER — Telehealth: Payer: Self-pay | Admitting: Cardiovascular Disease

## 2023-01-16 NOTE — Telephone Encounter (Signed)
  Pt is calling back to get an update from his insurance claim paperwork. He said, he dropped it off at the office on 12/21/22 and he gave it the front desk at [address]. He gave it to someone named Darryl

## 2023-01-17 NOTE — Telephone Encounter (Signed)
Returned call to patient and left detailed voicemail letting him know that I have refaxed all paperwork to [facility] and have originals if he'd like to pick them up. Fax confirmation received.

## 2023-01-19 NOTE — Progress Notes (Signed)
Carelink Summary Report / Loop Recorder

## 2023-01-29 ENCOUNTER — Ambulatory Visit: Payer: BC Managed Care – PPO

## 2023-01-29 ENCOUNTER — Other Ambulatory Visit (HOSPITAL_COMMUNITY): Payer: BC Managed Care – PPO

## 2023-01-29 DIAGNOSIS — I63412 Cerebral infarction due to embolism of left middle cerebral artery: Secondary | ICD-10-CM

## 2023-01-30 LAB — CUP PACEART REMOTE DEVICE CHECK
Date Time Interrogation Session: 20240204231849
Implantable Pulse Generator Implant Date: 20221104

## 2023-02-07 NOTE — Progress Notes (Incomplete)
Cardiology Office Note   Date:  02/07/2023   ID:  Scott Brandt, DOB 1961/07/31, MRN [id number]  PCP:  Guadlupe Spanish, MD  Cardiologist:  Scott Latch, MD  Electrophysiologist:  None   Evaluation Performed:  Follow-Up Visit  Chief Complaint:  hypertension  History of Present Illness:    Scott Brandt is a 62 y.o. male with hyperlipidemia, asthma, anxiety and TIA (2007) who presents for follow up.  He was initially seen 09/2017 to establish care.  He reported palpitations only occur in the setting of stressful situations.  The palpitations subside after the stressful stimulus is over.  He exercises 4 times per week and has no exertional symptoms.  No further testing was performed.  He was very hypertensive in clinic.  It was unclear if this was due to anxiety.  He was started on amlodipine 2.5 mg daily and asked to check his blood pressure at home.  He also had a coronary calcium score that showed one area of calcium in the left anterior descending.  His calcium score was 4, which is 44th percentile for age and gender.  It was recommended that he start taking his atorvastatin daily.   [name] stopped his amlodipine and was trying to control his BP with diet and exercise.  However his blood pressure has been quite elevated.  At his last appointment amlodipine was added at 5 mg daily.  His blood pressure continues to be elevated so amlodipine was increased to 10 mg.  He had an echocardiogram 02/11/2020 that revealed LVEF 55 to 60% with grade 1 diastolic dysfunction.  He had moderate LVH.  The echo was otherwise unremarkable.  He had a repeat echo 07/2020 that revealed LVEF 55 to 60% with grade 1 diastolic dysfunction and mild LVH.  [name] blood pressure remained a little elevated.  He wanted to work on diet and exercise prior to putting hydrochlorothiazide. He was admitted 10/2021 with a stroke. He presented with expressive aphasia and a right facial droop. He received TNK with near  resolution of symptoms He had a TEE with LVEF 60-65% and a small PFO. A loop recorder was inserted. Aspirin was switched to clopidogrel.   He saw Dr. Burt Knack and underwent PFO closure using an Amplatzer 25 mm PFO occluder device in 01/2022. He stated that he was feeling fine with no issues regarding his loop recorder. Since his PFO closure, he noticed that his blood pressure was a lot lower than it used to be, possibly due to lower stress levels. He held his statin due to myalgias. He then switched it to every other day.   At his last appointment, his diastolic blood pressure was elevated in clinic. Systolic BP was well controlled at home mostly in the 130s and occasionally 120s. He was encouraged to keep working on a consistent diet and limiting sodium intake. Lipids were uncontrolled on atorvastatin and he didn't tolerate higher doses. He was advised to start taking atorvastatin 20 mg daily instead of every other day.  Today,  He denies any palpitations, chest pain, shortness of breath, or peripheral edema. No lightheadedness, headaches, syncope, orthopnea, or PND.  (+)  ***Plan: -  Past Medical History:  Diagnosis Date   Asthma    signs of asthma from Dr Melvyn Novas    Asthma    Essential hypertension 02/24/2020   Hypercholesteremia    Left ventricular hypertrophy 02/24/2020   Lightheadedness 12/15/2020   TIA (transient ischemic attack) 2007   Urticaria  Past Surgical History:  Procedure Laterality Date   BUBBLE STUDY  10/28/2021   Procedure: BUBBLE STUDY;  Surgeon: Scott Latch, MD;  Location: [facility];  Service: Cardiovascular;;   FEMUR IM NAIL Left 02/28/2016   Procedure: INTRAMEDULLARY (IM) NAIL FEMORAL;  Surgeon: Scott Cancel, MD;  Location: [facility];  Service: Orthopedics;  Laterality: Left;   LOOP RECORDER INSERTION N/A 10/28/2021   Procedure: LOOP RECORDER INSERTION;  Surgeon: Scott Haw, MD;  Location: [facility];  Service: Cardiovascular;  Laterality: N/A;    PATENT FORAMEN OVALE(PFO) CLOSURE N/A 01/30/2022   Procedure: PATENT FORAMEN OVALE (PFO) CLOSURE;  Surgeon: Scott Mocha, MD;  Location: [facility];  Service: Cardiovascular;  Laterality: N/A;   TEE WITHOUT CARDIOVERSION N/A 10/28/2021   Procedure: TRANSESOPHAGEAL ECHOCARDIOGRAM (TEE);  Surgeon: Scott Latch, MD;  Location: [facility];  Service: Cardiovascular;  Laterality: N/A;     No outpatient medications have been marked as taking for the 02/09/23 encounter (Appointment) with Scott Latch, MD.     Allergies:   Iodine, Shellfish allergy, and Crestor [rosuvastatin]   Social History   Tobacco Use   Smoking status: Never   Smokeless tobacco: Never  Vaping Use   Vaping Use: Never used  Substance Use Topics   Alcohol use: Yes    Alcohol/week: 0.0 standard drinks of alcohol    Comment: social   Drug use: No     Family Hx: The patient's family history includes Allergic rhinitis in his father; Cancer in his sister; Diabetes in his sister; Hyperlipidemia in his brother and mother; Hypertension in his brother, mother, and sister; Rheum arthritis in his sister; Stroke in his maternal grandmother. There is no history of Angioedema, Asthma, Atopy, Eczema, Immunodeficiency, or Urticaria.  ROS:   Please see the history of present illness.     All other systems reviewed and are negative.   Prior CV studies:   The following studies were reviewed today:  Echo 01/30/2022:  1. Amplatzer 25 mm PFO occluder device placed on 01/30/2022. No residual  shunt by color flow Doppler.   2. Left ventricular ejection fraction, by estimation, is 60 to 65%. The  left ventricle has normal function.   3. Trivial mitral valve regurgitation.  Patent Foramen Ovale Closure 01/30/2022: Successful transcatheter PFO closure procedure using an Amplatzer 25 mm PFO occluder device under intracardiac echo guidance   Recommend: Clopidogrel 75 mg daily at least 6 months Same day limited echo with  planned DC if no complications arise SBE prophylaxis x 6 months (see DC instructions)  Loop Recorder Insertion 10/28/2021: CONCLUSIONS:   1. Successful implantation of a [employer] Reveal LINQ implantable loop recorder for cryptogenic stroke  2. No early apparent complications.   LE Venous DVT 10/28/2021: Summary:  BILATERAL:  - No evidence of deep vein thrombosis seen in the lower extremities,  bilaterally.  -No evidence of popliteal cyst, bilaterally.  Echo TEE 10/28/2021:  1. Left ventricular ejection fraction, by estimation, is 60 to 65%. The  left ventricle has normal function. The left ventricle has no regional  wall motion abnormalities.   2. Right ventricular systolic function is normal. The right ventricular  size is normal.   3. No left atrial/left atrial appendage thrombus was detected.   4. The mitral valve is normal in structure. Trivial mitral valve  regurgitation. No evidence of mitral stenosis.   5. The aortic valve is tricuspid. Aortic valve regurgitation is not  visualized. No aortic stenosis is present.   6.  The inferior vena cava is normal in size with greater than 50%  respiratory variability, suggesting right atrial pressure of 3 mmHg.   7. Evidence of atrial level shunting detected by color flow Doppler.  Agitated saline contrast bubble study was positive with shunting observed  within 3-6 cardiac cycles suggestive of interatrial shunt. There is a  small patent foramen ovale with  bidirectional shunting across atrial septum.   Conclusion(s)/Recommendation(s): Normal biventricular function without  evidence of hemodynamically significant valvular heart disease. Findings  are concerning for an interatrial shunt as detailed above.  Echo Bubble Study 10/27/2021:  1. Left ventricular ejection fraction, by estimation, is 55%. The left  ventricle has normal function. The left ventricle has no regional wall  motion abnormalities. There is mild left ventricular  hypertrophy. Left  ventricular diastolic parameters are  consistent with Grade I diastolic dysfunction (impaired relaxation).   2. Right ventricular systolic function is normal. The right ventricular  size is normal. Tricuspid regurgitation signal is inadequate for assessing  PA pressure.   3. The mitral valve is normal in structure. Trivial mitral valve  regurgitation. No evidence of mitral stenosis.   4. The aortic valve is tricuspid. Aortic valve regurgitation is not  visualized. No aortic stenosis is present.   5. The inferior vena cava is normal in size with greater than 50%  respiratory variability, suggesting right atrial pressure of 3 mmHg.   6. Positive bubble study, possible PFO.   CTA Head/Neck 10/26/2021:   IMPRESSION: Normal CT angiography of the head and neck. No large vessel occlusion. No proximal stenosis. No carotid bifurcation disease.  Echo 07/2020: 1. Left ventricular ejection fraction, by estimation, is 55 to 60%. The  left ventricle has normal function. The left ventricle has no regional  wall motion abnormalities. There is mild left ventricular hypertrophy.  Left ventricular diastolic parameters  are consistent with Grade I diastolic dysfunction (impaired relaxation).   2. Right ventricular systolic function is normal. The right ventricular  size is mildly enlarged. Tricuspid regurgitation signal is inadequate for  assessing PA pressure.   3. Right atrial size was mildly dilated.   4. The mitral valve is normal in structure. No evidence of mitral valve  regurgitation. No evidence of mitral stenosis.   5. The aortic valve is tricuspid. Aortic valve regurgitation is not  visualized. No aortic stenosis is present.   6. The inferior vena cava is normal in size with greater than 50%  respiratory variability, suggesting right atrial pressure of 3 mmHg.   Echo 02/11/20:  1. Left ventricular ejection fraction, by estimation, is 55 to 60%. The  left ventricle has  normal function. The left ventricle has no regional  wall motion abnormalities. There is moderate concentric left ventricular  hypertrophy. Left ventricular  diastolic parameters are consistent with Grade I diastolic dysfunction  (impaired relaxation). Elevated left atrial pressure.   2. Right ventricular systolic function is normal. The right ventricular  size is normal. There is normal pulmonary artery systolic pressure. The  estimated right ventricular systolic pressure is [id number] mmHg.   3. Left atrial size was moderately dilated.   4. The mitral valve is normal in structure and function. Trivial mitral  valve regurgitation. No evidence of mitral stenosis.   5. The aortic valve is normal in structure and function. Aortic valve  regurgitation is not visualized. Mild aortic valve sclerosis is present,  with no evidence of aortic valve stenosis.   6. The inferior vena cava is normal in size  with greater than 50%  respiratory variability, suggesting right atrial pressure of 3 mmHg.    Labs/Other Tests and Data Reviewed:    EKG:  EKG is personally reviewed. 02/09/2023:  EKG was not ordered. 11/03/22: Sinus bradycardia. Rate 55 bpm. PVC. 05/02/2022: EKG was not ordered. 11/09/2021: Sinus rhythm. Rate 63 bpm. IVCD. 08/20/2020: Sinus bradycardia. Rate 55 bpm. Nonspecific T wave abnormalities.  Recent Labs: No results found for requested labs within last 365 days.   Recent Lipid Panel Lab Results  Component Value Date/Time   CHOL 162 10/10/2022 10:53 AM   TRIG 59 10/10/2022 10:53 AM   HDL 72 10/10/2022 10:53 AM   CHOLHDL 2.3 10/10/2022 10:53 AM   CHOLHDL 2.3 10/27/2021 03:42 AM   LDLCALC 78 10/10/2022 10:53 AM    Wt Readings from Last 3 Encounters:  11/03/22 232 lb 9.6 oz (105.5 kg)  10/10/22 233 lb 9.6 oz (106 kg)  05/02/22 240 lb 12.8 oz (109.2 kg)     Objective:    VS:  There were no vitals taken for this visit. , BMI There is no height or weight on file to calculate  BMI. GENERAL:  Well appearing HEENT: Pupils equal round and reactive, fundi not visualized, oral mucosa unremarkable NECK:  No jugular venous distention, waveform within normal limits, carotid upstroke brisk and symmetric, no bruits, no thyromegaly HEART:  RRR.  PMI not displaced or sustained,S1 and S2 within normal limits, no S3, no S4, no clicks, no rubs, no murmurs ABD:  Flat, positive bowel sounds normal in frequency in pitch, no bruits, no rebound, no guarding, no midline pulsatile mass, no hepatomegaly, no splenomegaly EXT:  2 plus pulses throughout, no edema, no cyanosis no clubbing SKIN:  No rashes no nodules NEURO:  Cranial nerves II through XII grossly intact, motor grossly intact throughout PSYCH:  Cognitively intact, oriented to person place and time   ASSESSMENT & PLAN:    No problem-specific Assessment & Plan notes found for this encounter.   Disposition: FU with Tiffany C. Oval Linsey, MD, [facility] in ***3-4 months.   Medication Adjustments/Labs and Tests Ordered: Current medicines are reviewed at length with the patient today.  Concerns regarding medicines are outlined above.   Tests Ordered: No orders of the defined types were placed in this encounter.  Medication Changes: No orders of the defined types were placed in this encounter.  I,Mathew Stumpf,acting as a Education administrator for Scott Latch, MD.,have documented all relevant documentation on the behalf of Scott Latch, MD,as directed by  Scott Latch, MD while in the presence of Scott Latch, MD.   I, Elberton Oval Linsey, MD have reviewed all documentation for this visit.  The documentation of the exam, diagnosis, procedures, and orders on 02/07/2023 are all accurate and complete.  Waynetta Pean  02/07/2023 3:56 PM    [facility]

## 2023-02-09 ENCOUNTER — Ambulatory Visit (HOSPITAL_BASED_OUTPATIENT_CLINIC_OR_DEPARTMENT_OTHER): Payer: 59 | Admitting: Cardiovascular Disease

## 2023-02-16 NOTE — Progress Notes (Unsigned)
[facility] [place]                                     Cardiology Office Note:    Date:  02/19/2023   ID:  [name], DOB [birth year], MRN [id number]  PCP:  Guadlupe Spanish, MD  [facility] Cardiologist:  [name], MD / Dr. Burt Knack, MD (PFO)  [facility] Electrophysiologist:  None   Referring MD: Guadlupe Spanish, MD   Chief Complaint  Patient presents with   Follow-up    F/u PFO   History of Present Illness:    Scott Brandt is a 62 y.o. male with a hx of HTN, HLD, aortic arch atherosclerosis, TIA/CVAs s/p loop recorder implantation and PFO who presents to clinic for one year follow up after PFO closure.      He was initially was diagnosed with a TIA in 2007. He was then admitted 10/2021 with expressive aphasia and right facial droop, diagnosed with an acute ischemic stroke, and treated with TNKase with resolution of his symptoms. There was no arrhythmia seen on telemetry in the hospital. A loop recorder was implanted for ongoing monitoring. TEE demonstrated no significant valvular heart disease and normal LV function however a PFO was found. CTA of the chest showed arch atherosclerosis with no large vessel occlusive disease with widely patent carotid arteries. The patient underwent outpatient neurology follow-up and a transcranial Doppler study demonstrated large intracardiac right to left shunt.    He was seen in consultation by Dr. Burt Knack on 12/05/21 for consideration of PFO closure. Per Dr. Burt Knack, while his rope score is 4, predicting a lower association between the PFO and etiology of stroke, anatomic features of his PFO suggest higher risk. In addition, the patient has a hx of prior TIA and was taking aspirin at the time of his event.  He personally reviewed his TEE which demonstrated a hypermobile interatrial septum and a moderate sized PFO with strongly positive bubble study. The transcranial Doppler showed a large right  to left intracardiac shunt.   Patient's loop recorder has not shown any atrial fib to date, which was last interrogated 01/28/23.   Today he presents alone and reports he has been doing well. He denies chest pain, SOB, LE edema, palpitations, orthopnea, dizziness, syncope, or any new neuro changes.   Past Medical History:  Diagnosis Date   Asthma    signs of asthma from Dr Melvyn Novas    Asthma    Essential hypertension 02/24/2020   Hypercholesteremia    Left ventricular hypertrophy 02/24/2020   Lightheadedness 12/15/2020   TIA (transient ischemic attack) 2007   Urticaria     Past Surgical History:  Procedure Laterality Date   BUBBLE STUDY  10/28/2021   Procedure: BUBBLE STUDY;  Surgeon: [name], MD;  Location: [place];  Service: Cardiovascular;;   FEMUR IM NAIL Left 02/28/2016   Procedure: INTRAMEDULLARY (IM) NAIL FEMORAL;  Surgeon: [name], MD;  Location: [place];  Service: Orthopedics;  Laterality: Left;   LOOP RECORDER INSERTION N/A 10/28/2021   Procedure: LOOP RECORDER INSERTION;  Surgeon: [name], MD;  Location: [place] [facility];  Service: Cardiovascular;  Laterality: N/A;   PATENT FORAMEN OVALE(PFO) CLOSURE N/A 01/30/2022   Procedure: PATENT FORAMEN OVALE (PFO) CLOSURE;  Surgeon: [name], MD;  Location: [place] [facility];  Service: Cardiovascular;  Laterality:  N/A;   TEE WITHOUT CARDIOVERSION N/A 10/28/2021   Procedure: TRANSESOPHAGEAL ECHOCARDIOGRAM (TEE);  Surgeon: [name], MD;  Location: [facility] END[facility];  Service: Cardiovascular;  Laterality: N/A;    Current Medications: Current Meds  Medication Sig   amLODipine (NORVASC) 10 MG tablet TAKE 1 TABLET DAILY   amoxicillin (AMOXIL) 500 MG capsule TAKE 4 CAPS BY MOUTH 1 HR PRIOR TO DENTAL APPT   aspirin EC 81 MG tablet Take 81 mg by mouth daily. Swallow whole.   atorvastatin (LIPITOR) 20 MG tablet Take 20 mg by mouth daily.   Coenzyme Q10 (CO Q 10) 10 MG CAPS Take 1 capsule by mouth daily.    fexofenadine (ALLEGRA) 180 MG tablet Take 180 mg by mouth daily as needed for allergies.   Multiple Vitamin (MULTIVITAMIN) capsule Take 1 capsule by mouth daily.   VITAMIN D, CHOLECALCIFEROL, PO Take 1 tablet by mouth daily.     Allergies:   Iodine, Shellfish allergy, and Crestor [rosuvastatin]   Social History   Socioeconomic History   Marital status: Married    Spouse name: Not on file   Number of children: Not on file   Years of education: Not on file   Highest education level: Not on file  Occupational History   Not on file  Tobacco Use   Smoking status: Never   Smokeless tobacco: Never  Vaping Use   Vaping Use: Never used  Substance and Sexual Activity   Alcohol use: Yes    Alcohol/week: 0.0 standard drinks of alcohol    Comment: social   Drug use: No   Sexual activity: Yes    Partners: Female    Birth control/protection: None  Other Topics Concern   Not on file  Social History Narrative   ** Merged History Encounter **       Social Determinants of Health   Financial Resource Strain: Not on file  Food Insecurity: Not on file  Transportation Needs: Not on file  Physical Activity: Not on file  Stress: Not on file  Social Connections: Not on file     Family History: The patient's family history includes Allergic rhinitis in his father; Cancer in his sister; Diabetes in his sister; Hyperlipidemia in his brother and mother; Hypertension in his brother, mother, and sister; Rheum arthritis in his sister; Stroke in his maternal grandmother. There is no history of Angioedema, Asthma, Atopy, Eczema, Immunodeficiency, or Urticaria.  ROS:   Please see the history of present illness.    All other systems reviewed and are negative.  EKGs/Labs/Other Studies Reviewed:    The following studies were reviewed today:  Limited echo with bubble 02/19/23:   1. Left ventricular ejection fraction, by estimation, is 55 to 60%. The  left ventricle has normal function. The left  ventricle has no regional  wall motion abnormalities. There is mild concentric left ventricular  hypertrophy. Left ventricular diastolic  parameters are consistent with Grade I diastolic dysfunction (impaired  relaxation).   2. The mitral valve is normal in structure. Trivial mitral valve  regurgitation. No evidence of mitral stenosis.   3. The aortic valve is tricuspid. Aortic valve regurgitation is not  visualized. No aortic stenosis is present.   4. Negative bubble study, no evidence for PFO or ASD.   5. The inferior vena cava is normal in size with greater than 50%  respiratory variability, suggesting right atrial pressure of 3 mmHg.   6. Limited echo for bubble study.   Transcranial Doppler: A full curtain of  high intensity transient signals were observed at rest  and with valsalva, indicating a Spencer Grade 5 patent foramen ovale.   Strongly Positive TCD Bubble study indicative of a large right to left  shunt    TEE 10/28/21: 1. Left ventricular ejection fraction, by estimation, is 60 to 65%. The  left ventricle has normal function. The left ventricle has no regional  wall motion abnormalities.   2. Right ventricular systolic function is normal. The right ventricular  size is normal.   3. No left atrial/left atrial appendage thrombus was detected.   4. The mitral valve is normal in structure. Trivial mitral valve  regurgitation. No evidence of mitral stenosis.   5. The aortic valve is tricuspid. Aortic valve regurgitation is not  visualized. No aortic stenosis is present.   6. The inferior vena cava is normal in size with greater than 50%  respiratory variability, suggesting right atrial pressure of 3 mmHg.   7. Evidence of atrial level shunting detected by color flow Doppler.  Agitated saline contrast bubble study was positive with shunting observed  within 3-6 cardiac cycles suggestive of interatrial shunt. There is a  small patent foramen ovale with  bidirectional  shunting across atrial septum.   Conclusion(s)/Recommendation(s): Normal biventricular function without  evidence of hemodynamically significant valvular heart disease. Findings  are concerning for an interatrial shunt as detailed above.   EKG:  EKG is not ordered today.    Recent Labs: No results found for requested labs within last 365 days.   Recent Lipid Panel    Component Value Date/Time   CHOL 162 10/10/2022 1053   TRIG 59 10/10/2022 1053   HDL 72 10/10/2022 1053   CHOLHDL 2.3 10/10/2022 1053   CHOLHDL 2.3 10/27/2021 0342   VLDL 8 10/27/2021 0342   LDLCALC 78 10/10/2022 1053   Physical Exam:    VS:  BP 118/80   Pulse (!) 58   Ht 6' (1.829 m)   Wt 235 lb (106.6 kg)   SpO2 96%   BMI 31.87 kg/m     Wt Readings from Last 3 Encounters:  02/19/23 235 lb (106.6 kg)  11/03/22 232 lb 9.6 oz (105.5 kg)  10/10/22 233 lb 9.6 oz (106 kg)    General: Well developed, well nourished, NAD Lungs:Clear to ausculation bilaterally. No wheezes, rales, or rhonchi. Breathing is unlabored. Cardiovascular: RRR with S1 S2. No murmurs Extremities: No edema.  Neuro: Alert and oriented. No focal deficits. No facial asymmetry. MAE spontaneously. Psych: Responds to questions appropriately with normal affect.    ASSESSMENT/PLAN:    PFO: Underwent PFO closure 01/30/22 with Amplatzer 25 mm PFO occluder device under intracardiac echo guidance. Limited echocardiogram today with negative bubble and no evidence of intra-atrial shunting. No longer will require dental SBE. Continue ASA and follow with Dr. Oval Linsey 07/2023 for regular OV.    Hx of TIA/CVA: Continue ASA. No new neuro symptoms.    HTN: Improved from last OV. Continue current regimen.    Medication Adjustments/Labs and Tests Ordered: Current medicines are reviewed at length with the patient today.  Concerns regarding medicines are outlined above.  No orders of the defined types were placed in this encounter.  No orders of the defined  types were placed in this encounter.   Patient Instructions  Medication Instructions:  Your physician recommends that you continue on your current medications as directed. Please refer to the Current Medication list given to you today.  *If you need a refill on your cardiac  medications before your next appointment, please call your pharmacy*   Lab Work: NONE If you have labs (blood work) drawn today and your tests are completely normal, you will receive your results only by: [place] (if you have MyChart) OR A paper copy in the mail If you have any lab test that is abnormal or we need to change your treatment, we will call you to review the results.   Testing/Procedures: NONE   Follow-Up: At [facility], you and your health needs are our priority.  As part of our continuing mission to provide you with exceptional heart care, we have created designated Provider Care Teams.  These Care Teams include your primary Cardiologist (physician) and Advanced Practice Providers (APPs -  Physician Assistants and Nurse Practitioners) who all work together to provide you with the care you need, when you need it.  We recommend signing up for the patient portal called "MyChart".  Sign up information is provided on this After Visit Summary.  MyChart is used to connect with patients for Virtual Visits (Telemedicine).  Patients are able to view lab/test results, encounter notes, upcoming appointments, etc.  Non-urgent messages can be sent to your provider as well.   To learn more about what you can do with MyChart, go to [email or web address].    Your next appointment:   KEEP SCHEDULED FOLLOW-UP   Signed, Kathyrn Drown, NP  02/19/2023 8:43 AM    [place]

## 2023-02-19 ENCOUNTER — Ambulatory Visit: Payer: BC Managed Care – PPO | Admitting: Cardiology

## 2023-02-19 ENCOUNTER — Ambulatory Visit: Payer: BC Managed Care – PPO | Attending: Cardiology

## 2023-02-19 VITALS — BP 118/80 | HR 58 | Ht 72.0 in | Wt 235.0 lb

## 2023-02-19 DIAGNOSIS — I253 Aneurysm of heart: Secondary | ICD-10-CM | POA: Insufficient documentation

## 2023-02-19 DIAGNOSIS — Q2112 Patent foramen ovale: Secondary | ICD-10-CM

## 2023-02-19 DIAGNOSIS — I10 Essential (primary) hypertension: Secondary | ICD-10-CM | POA: Diagnosis present

## 2023-02-19 LAB — ECHOCARDIOGRAM LIMITED BUBBLE STUDY
Area-P 1/2: 3.5 cm2
S' Lateral: 3.1 cm

## 2023-02-19 NOTE — Patient Instructions (Signed)
Medication Instructions:  Your physician recommends that you continue on your current medications as directed. Please refer to the Current Medication list given to you today.  *If you need a refill on your cardiac medications before your next appointment, please call your pharmacy*   Lab Work: NONE If you have labs (blood work) drawn today and your tests are completely normal, you will receive your results only by: [place] (if you have MyChart) OR A paper copy in the mail If you have any lab test that is abnormal or we need to change your treatment, we will call you to review the results.   Testing/Procedures: NONE   Follow-Up: At [facility], you and your health needs are our priority.  As part of our continuing mission to provide you with exceptional heart care, we have created designated Provider Care Teams.  These Care Teams include your primary Cardiologist (physician) and Advanced Practice Providers (APPs -  Physician Assistants and Nurse Practitioners) who all work together to provide you with the care you need, when you need it.  We recommend signing up for the patient portal called "MyChart".  Sign up information is provided on this After Visit Summary.  MyChart is used to connect with patients for Virtual Visits (Telemedicine).  Patients are able to view lab/test results, encounter notes, upcoming appointments, etc.  Non-urgent messages can be sent to your provider as well.   To learn more about what you can do with MyChart, go to [email or web address].    Your next appointment:   KEEP SCHEDULED FOLLOW-UP

## 2023-02-25 ENCOUNTER — Encounter (HOSPITAL_BASED_OUTPATIENT_CLINIC_OR_DEPARTMENT_OTHER): Payer: Self-pay | Admitting: Cardiovascular Disease

## 2023-02-26 MED ORDER — AMLODIPINE BESYLATE 10 MG PO TABS: 10.0000 mg | ORAL_TABLET | Freq: Every day | ORAL | 3 refills | Status: DC

## 2023-03-05 ENCOUNTER — Ambulatory Visit: Payer: BC Managed Care – PPO

## 2023-03-05 DIAGNOSIS — I63412 Cerebral infarction due to embolism of left middle cerebral artery: Secondary | ICD-10-CM | POA: Diagnosis not present

## 2023-03-06 LAB — CUP PACEART REMOTE DEVICE CHECK
Date Time Interrogation Session: 20240310231347
Implantable Pulse Generator Implant Date: 20221104

## 2023-03-14 NOTE — Progress Notes (Signed)
Carelink Summary Report / Loop Recorder 

## 2023-04-09 ENCOUNTER — Ambulatory Visit (INDEPENDENT_AMBULATORY_CARE_PROVIDER_SITE_OTHER): Payer: BC Managed Care – PPO

## 2023-04-09 DIAGNOSIS — I63412 Cerebral infarction due to embolism of left middle cerebral artery: Secondary | ICD-10-CM | POA: Diagnosis not present

## 2023-04-09 LAB — CUP PACEART REMOTE DEVICE CHECK
Date Time Interrogation Session: 20240414231124
Implantable Pulse Generator Implant Date: 20221104

## 2023-04-11 NOTE — Progress Notes (Signed)
Carelink Summary Report / Loop Recorder 

## 2023-05-11 NOTE — Progress Notes (Signed)
Carelink Summary Report / Loop Recorder 

## 2023-05-14 ENCOUNTER — Ambulatory Visit (INDEPENDENT_AMBULATORY_CARE_PROVIDER_SITE_OTHER): Payer: 59

## 2023-05-14 DIAGNOSIS — I63412 Cerebral infarction due to embolism of left middle cerebral artery: Secondary | ICD-10-CM | POA: Diagnosis not present

## 2023-05-14 LAB — CUP PACEART REMOTE DEVICE CHECK
Date Time Interrogation Session: 20240517230618
Implantable Pulse Generator Implant Date: 20221104

## 2023-05-22 ENCOUNTER — Telehealth: Payer: Self-pay | Admitting: Cardiovascular Disease

## 2023-05-22 NOTE — Telephone Encounter (Signed)
   Pre-operative Risk Assessment    Patient Name: Scott Brandt  DOB: 08-24-1961 MRN: [id number]     Request for Surgical Clearance    Procedure:   Routine Dental Cleaning  Date of Surgery:  Clearance 05/22/23                                 Surgeon:  Leatha Gilding DDS Surgeon's Group or Practice Name:  [facility] Phone number:  [phone] Fax number:  [phone]   Type of Clearance Requested:   - Medical    Type of Anesthesia:  None    Additional requests/questions:   Needing confirmation patient is not needing premed before cleaning.   Minna Antis   05/22/2023, 12:17 PM

## 2023-05-22 NOTE — Telephone Encounter (Signed)
   Patient Name: Scott Brandt  DOB: 01/14/1961 MRN: [id number]  Primary Cardiologist: Chilton Si, MD  Chart reviewed as part of pre-operative protocol coverage.   Simple dental extractions (i.e. 1-2 teeth) are considered low risk procedures per guidelines and generally do not require any specific cardiac clearance. It is also generally accepted that for simple extractions and dental cleanings, there is no need to interrupt blood thinner therapy.   SBE prophylaxis is not required for the patient from a cardiac standpoint.  I will route this recommendation to the requesting party via Epic fax function and remove from pre-op pool.  Please call with questions.  Napoleon Form, Leodis Rains, NP 05/22/2023, 12:24 PM

## 2023-05-26 LAB — AMB RESULTS CONSOLE CBG: Glucose: 89

## 2023-05-26 NOTE — Progress Notes (Unsigned)
BP 109/65   Gluc 89/Pt. Attended health equity event screening-[place] [employer]/PT. Has a PCP/ No SDOH needs have been requested at this time.

## 2023-06-08 NOTE — Progress Notes (Signed)
Carelink Summary Report / Loop Recorder 

## 2023-06-14 ENCOUNTER — Ambulatory Visit (INDEPENDENT_AMBULATORY_CARE_PROVIDER_SITE_OTHER): Payer: 59

## 2023-06-14 DIAGNOSIS — I63412 Cerebral infarction due to embolism of left middle cerebral artery: Secondary | ICD-10-CM

## 2023-06-14 LAB — CUP PACEART REMOTE DEVICE CHECK
Date Time Interrogation Session: 20240619230629
Implantable Pulse Generator Implant Date: 20221104

## 2023-06-29 NOTE — Progress Notes (Signed)
Pt attended screening event on 05/26/23, where screening results were wnl. Pt confirmed Dr. Kathrynn Speed as PCP and no SDOH insecurities was indicated at the event. Per chart review, Pt was seen for Hypertension with Cardiologist on 02/19/23 and has ongoing visit. Pt upcoming cardiologist appointment on 07/17/23. No additional health equity team support indicated at this time.

## 2023-07-04 NOTE — Progress Notes (Signed)
Carelink Summary Report / Loop Recorder 

## 2023-07-17 ENCOUNTER — Ambulatory Visit (INDEPENDENT_AMBULATORY_CARE_PROVIDER_SITE_OTHER): Payer: 59

## 2023-07-17 DIAGNOSIS — I63412 Cerebral infarction due to embolism of left middle cerebral artery: Secondary | ICD-10-CM | POA: Diagnosis not present

## 2023-07-18 LAB — CUP PACEART REMOTE DEVICE CHECK
Date Time Interrogation Session: 20240722230311
Implantable Pulse Generator Implant Date: 20221104

## 2023-08-01 ENCOUNTER — Encounter (HOSPITAL_BASED_OUTPATIENT_CLINIC_OR_DEPARTMENT_OTHER): Payer: Self-pay | Admitting: Cardiovascular Disease

## 2023-08-01 ENCOUNTER — Ambulatory Visit (HOSPITAL_BASED_OUTPATIENT_CLINIC_OR_DEPARTMENT_OTHER): Payer: 59 | Admitting: Cardiovascular Disease

## 2023-08-01 VITALS — BP 108/70 | HR 63 | Wt 232.0 lb

## 2023-08-01 DIAGNOSIS — I253 Aneurysm of heart: Secondary | ICD-10-CM

## 2023-08-01 DIAGNOSIS — E7800 Pure hypercholesterolemia, unspecified: Secondary | ICD-10-CM

## 2023-08-01 DIAGNOSIS — Q2112 Patent foramen ovale: Secondary | ICD-10-CM

## 2023-08-01 DIAGNOSIS — I639 Cerebral infarction, unspecified: Secondary | ICD-10-CM

## 2023-08-01 DIAGNOSIS — I10 Essential (primary) hypertension: Secondary | ICD-10-CM

## 2023-08-01 DIAGNOSIS — I63412 Cerebral infarction due to embolism of left middle cerebral artery: Secondary | ICD-10-CM | POA: Diagnosis not present

## 2023-08-01 NOTE — Assessment & Plan Note (Signed)
No significant residual symptoms.  Continue aspirin, atorvastatin and BP control.

## 2023-08-01 NOTE — Progress Notes (Signed)
Cardiology Office Note:  .    Date:  08/01/2023  ID:  Scott Brandt, DOB 1961-12-10, MRN [id number] PCP: Karle Plumber, MD  [facility] Providers Cardiologist:  Chilton Si, MD     History of Present Illness: .    Scott Brandt is a 62 y.o. male with hyperlipidemia, asthma, anxiety, and TIA (2007), who presents for follow up.  He was initially seen 09/2017 and reported palpitations only occurring in the setting of stressful situations.  The palpitations subsided after the stressful stimulus was over.  He exercised 4 times per week without exertional symptoms.  No further testing was performed.  He was very hypertensive in clinic.  It was unclear if this was due to anxiety.  He was started on amlodipine 2.5 mg daily and asked to check his blood pressure at home.  He also had a coronary calcium score that showed one area of calcium in the left anterior descending.  His calcium score was 4, which is 44th percentile for age and gender.  It was recommended that he start taking his atorvastatin daily.   [name] stopped his amlodipine and was trying to control his BP with diet and exercise.  However his blood pressure became quite elevated. Amlodipine was restarted at 5 mg daily and later increased to 10 mg daily. He had an echocardiogram 02/11/2020 that revealed LVEF 55 to 60% with grade 1 diastolic dysfunction.  He had moderate LVH.  The echo was otherwise unremarkable.  He had a repeat echo 07/2020 that revealed LVEF 55 to 60% with grade 1 diastolic dysfunction and mild LVH.   [name] blood pressure remained a little elevated.  He wanted to work on diet and exercise prior to putting hydrochlorothiazide. He was admitted 10/2021 with a stroke. He presented with expressive aphasia and a right facial droop. He received TNK with near resolution of symptoms. He had a TEE with LVEF 60-65% and a small PFO. A loop recorder was inserted. Aspirin was switched to clopidogrel.    He saw Dr. Excell Seltzer and  underwent PFO closure using an Amplatzer 25 mm PFO occluder device in 01/2022. He stated that he was feeling fine with no issues regarding his loop recorder. Since his PFO closure, he noticed that his blood pressure was a lot lower than it used to be, possibly due to lower stress levels. He held his statin due to myalgias. He then switched it to every other day.   At his visit 10/2022, his diastolic blood pressure was elevated in the office. Home blood pressures were mostly in the 130s systolic with occasional 120s. He was going to work on being more consistent with his diet and limiting sodium intake. He was instructed to take his atorvastatin every day. On 02/19/23 he followed up with [name], NP and his blood pressure was 118/80. His loop recorder had not shown any atrial fibrillation. Limited echocardiogram was performed with negative bubble and no evidence of intra-atrial shunting. No longer required dental SBE.   Today, he reports experiencing periodic brief headaches over the past month. These headaches are not especially bothersome and he attributes them to sleep deprivation in the setting of a time zone change. He has returned from [place]. While in [place] he had been keeping up with his exercise 3-4 days a week, now down to 2 days a week. No anginal symptoms. He admits to dietary indiscretions but continues to work on his diet. In the office today his blood pressure  is 108/70; he has fallen out of the habit of monitoring home readings. Recently had a Covid-19 infection in July, he appears to be recovering well at this time. Additionally he complained of mild shoulder pain this past weekend, as well his right thumb locking up occasionally. He also wished to discuss decreased libido. He has been under some stress lately. He denies any palpitations, chest pain, shortness of breath, peripheral edema, lightheadedness, syncope, orthopnea, or PND.  ROS:  Please see the history of present illness.  All other systems are reviewed and negative.  (+) Brief, intermittent headaches.  Studies Reviewed: .        Echo  March 06, 2023:  1. Left ventricular ejection fraction, by estimation, is 55 to 60%. The  left ventricle has normal function. The left ventricle has no regional  wall motion abnormalities. There is mild concentric left ventricular  hypertrophy. Left ventricular diastolic  parameters are consistent with Grade I diastolic dysfunction (impaired  relaxation).   2. The mitral valve is normal in structure. Trivial mitral valve  regurgitation. No evidence of mitral stenosis.   3. The aortic valve is tricuspid. Aortic valve regurgitation is not  visualized. No aortic stenosis is present.   4. Negative bubble study, no evidence for PFO or ASD.   5. The inferior vena cava is normal in size with greater than 50%  respiratory variability, suggesting right atrial pressure of 3 mmHg.   6. Limited echo for bubble study.   Risk Assessment/Calculations:             Physical Exam:    VS:  BP 108/70 (BP Location: Left Arm, Patient Position: Sitting, Cuff Size: Normal)   Pulse 63   Wt 232 lb (105.2 kg)   SpO2 98%   BMI 31.46 kg/m  , BMI Body mass index is 31.46 kg/m. GENERAL:  Well appearing HEENT: Pupils equal round and reactive, fundi not visualized, oral mucosa unremarkable NECK:  No jugular venous distention, waveform within normal limits, carotid upstroke brisk and symmetric, no bruits, no thyromegaly LUNGS:  Clear to auscultation bilaterally HEART:  RRR.  PMI not displaced or sustained,S1 and S2 within normal limits, no S3, no S4, no clicks, no rubs,  murmurs ABD:  Flat, positive bowel sounds normal in frequency in pitch, no bruits, no rebound, no guarding, no midline pulsatile mass, no hepatomegaly, no splenomegaly EXT:  2 plus pulses throughout, no edema, no cyanosis no clubbing SKIN:  No rashes no nodules NEURO:  Cranial nerves II through XII grossly intact, motor grossly intact  throughout PSYCH:  Cognitively intact, oriented to person place and time  Wt Readings from Last 3 Encounters:  08/01/23 232 lb (105.2 kg)  02/19/23 235 lb (106.6 kg)  11/03/22 232 lb 9.6 oz (105.5 kg)     ASSESSMENT AND PLAN: .    Essential hypertension BP well-controlled on amlodipine.  Continue current dose and increase exercise back to 150 minutes weekly.  PFO with atrial septal aneurysm S/p PFO closure device.  Continue aspirin.    Stroke (HCC) No significant residual symptoms.  Continue aspirin, atorvastatin and BP control.  Assessment and Plan   # Hyperlipidemia Last LDL level slightly above target (78, target <70) as of October last year. - Plan to recheck lipid panel during upcoming physical with primary care provider. Continue current medication regimen and encourage a diet low in saturated fats.   # PFO:  S/p PFO closure.  Continue aspirin.  No antibiotic prophylaxis needed.   # COVID-19 Recovered  from second episode of COVID-19 in July. No significant residual symptoms reported. - Continue monitoring for any post-acute sequelae of SARS-CoV-2 infection (PASC).  # Exercise and Stress Management Reduced exercise frequency due to work stress and recent illness. Expressed desire to increase physical activity. - Encourage regular exercise and stress management techniques.  # Sexual Health Decreased sexual interest reported, potentially related to stress and life changes. No physical inability reported. - Consider stress management techniques and potential therapy. Discuss potential use of medications like Viagra or Cialis if deemed necessary.    # Recurrent Headaches Occurring intermittently for the past 1-1.5 months, potentially related to sleep deprivation and time zone changes. No other neurological symptoms reported. - Monitor symptoms. If headaches persist or worsen, consider further evaluation.   Follow-up Annual follow-up unless new concerns arise.             Dispo:  FU with Kainon Varady C. Duke Salvia, MD, [facility] in 1 year.  I,Mathew Stumpf,acting as a Neurosurgeon for Chilton Si, MD.,have documented all relevant documentation on the behalf of Chilton Si, MD,as directed by  Chilton Si, MD while in the presence of Chilton Si, MD.  I, Jamine Wingate C. Duke Salvia, MD have reviewed all documentation for this visit.  The documentation of the exam, diagnosis, procedures, and orders on 08/01/2023 are all accurate and complete.   Signed, Chilton Si, MD

## 2023-08-01 NOTE — Assessment & Plan Note (Signed)
S/p PFO closure device.  Continue aspirin.

## 2023-08-01 NOTE — Progress Notes (Signed)
Carelink Summary Report / Loop Recorder 

## 2023-08-01 NOTE — Assessment & Plan Note (Signed)
BP well-controlled on amlodipine.  Continue current dose and increase exercise back to 150 minutes weekly.

## 2023-08-01 NOTE — Patient Instructions (Signed)
Medication Instructions:  Your physician recommends that you continue on your current medications as directed. Please refer to the Current Medication list given to you today.  *If you need a refill on your cardiac medications before your next appointment, please call your pharmacy*  Lab Work: HAVE YOUR PRIMARY SEND WHEN YOU HAVE THEM DONE   Testing/Procedures: NONE  Follow-Up: At [facility], you and your health needs are our priority.  As part of our continuing mission to provide you with exceptional heart care, we have created designated Provider Care Teams.  These Care Teams include your primary Cardiologist (physician) and Advanced Practice Providers (APPs -  Physician Assistants and Nurse Practitioners) who all work together to provide you with the care you need, when you need it.  We recommend signing up for the patient portal called "MyChart".  Sign up information is provided on this After Visit Summary.  MyChart is used to connect with patients for Virtual Visits (Telemedicine).  Patients are able to view lab/test results, encounter notes, upcoming appointments, etc.  Non-urgent messages can be sent to your provider as well.   To learn more about what you can do with MyChart, go to [email or web address].    Your next appointment:   12 month(s)  Provider:   Chilton Si, MD or Gillian Shields, NP

## 2023-08-19 LAB — CUP PACEART REMOTE DEVICE CHECK
Date Time Interrogation Session: 20240824230505
Implantable Pulse Generator Implant Date: 20221104

## 2023-08-20 ENCOUNTER — Ambulatory Visit (INDEPENDENT_AMBULATORY_CARE_PROVIDER_SITE_OTHER): Payer: 59

## 2023-08-20 DIAGNOSIS — I63412 Cerebral infarction due to embolism of left middle cerebral artery: Secondary | ICD-10-CM

## 2023-08-29 NOTE — Progress Notes (Signed)
Carelink Summary Report / Loop Recorder 

## 2023-09-21 LAB — CUP PACEART REMOTE DEVICE CHECK
Date Time Interrogation Session: 20240926230525
Implantable Pulse Generator Implant Date: 20221104

## 2023-09-24 ENCOUNTER — Ambulatory Visit (INDEPENDENT_AMBULATORY_CARE_PROVIDER_SITE_OTHER): Payer: 59

## 2023-09-24 DIAGNOSIS — I63412 Cerebral infarction due to embolism of left middle cerebral artery: Secondary | ICD-10-CM

## 2023-10-08 NOTE — Progress Notes (Signed)
Carelink Summary Report / Loop Recorder 

## 2023-10-26 LAB — CUP PACEART REMOTE DEVICE CHECK
Date Time Interrogation Session: 20241029230637
Implantable Pulse Generator Implant Date: 20221104

## 2023-10-29 ENCOUNTER — Ambulatory Visit (INDEPENDENT_AMBULATORY_CARE_PROVIDER_SITE_OTHER): Payer: 59

## 2023-10-29 DIAGNOSIS — I63412 Cerebral infarction due to embolism of left middle cerebral artery: Secondary | ICD-10-CM

## 2023-11-19 NOTE — Progress Notes (Signed)
Carelink Summary Report / Loop Recorder 

## 2023-12-03 ENCOUNTER — Ambulatory Visit (INDEPENDENT_AMBULATORY_CARE_PROVIDER_SITE_OTHER): Payer: 59

## 2023-12-03 DIAGNOSIS — I63412 Cerebral infarction due to embolism of left middle cerebral artery: Secondary | ICD-10-CM | POA: Diagnosis not present

## 2023-12-03 LAB — CUP PACEART REMOTE DEVICE CHECK
Date Time Interrogation Session: 20241208230658
Implantable Pulse Generator Implant Date: 20221104

## 2024-01-07 ENCOUNTER — Ambulatory Visit (INDEPENDENT_AMBULATORY_CARE_PROVIDER_SITE_OTHER): Payer: 59

## 2024-01-07 DIAGNOSIS — I63412 Cerebral infarction due to embolism of left middle cerebral artery: Secondary | ICD-10-CM | POA: Diagnosis not present

## 2024-01-07 LAB — CUP PACEART REMOTE DEVICE CHECK
Date Time Interrogation Session: 20250112230912
Implantable Pulse Generator Implant Date: 20221104

## 2024-01-26 ENCOUNTER — Other Ambulatory Visit (HOSPITAL_BASED_OUTPATIENT_CLINIC_OR_DEPARTMENT_OTHER): Payer: Self-pay | Admitting: Cardiovascular Disease

## 2024-02-11 ENCOUNTER — Ambulatory Visit (INDEPENDENT_AMBULATORY_CARE_PROVIDER_SITE_OTHER): Payer: 59

## 2024-02-11 DIAGNOSIS — I63412 Cerebral infarction due to embolism of left middle cerebral artery: Secondary | ICD-10-CM

## 2024-02-12 LAB — CUP PACEART REMOTE DEVICE CHECK
Date Time Interrogation Session: 20250216230741
Implantable Pulse Generator Implant Date: 20221104

## 2024-02-18 NOTE — Progress Notes (Signed)
 [facility]link Summary Report / Loop Recorder

## 2024-03-17 ENCOUNTER — Ambulatory Visit (INDEPENDENT_AMBULATORY_CARE_PROVIDER_SITE_OTHER): Payer: 59

## 2024-03-17 DIAGNOSIS — I63412 Cerebral infarction due to embolism of left middle cerebral artery: Secondary | ICD-10-CM | POA: Diagnosis not present

## 2024-03-17 LAB — CUP PACEART REMOTE DEVICE CHECK
Date Time Interrogation Session: 20250323230800
Implantable Pulse Generator Implant Date: 20221104

## 2024-03-18 NOTE — Addendum Note (Signed)
 Addended by: Geralyn Flash D on: 03/18/2024 03:45 PM   Modules accepted: Orders

## 2024-03-18 NOTE — Progress Notes (Signed)
 [facility]link Summary Report / Loop Recorder

## 2024-04-21 ENCOUNTER — Ambulatory Visit: Payer: 59

## 2024-04-21 DIAGNOSIS — I63412 Cerebral infarction due to embolism of left middle cerebral artery: Secondary | ICD-10-CM

## 2024-04-21 LAB — CUP PACEART REMOTE DEVICE CHECK
Date Time Interrogation Session: 20250427231004
Implantable Pulse Generator Implant Date: 20221104

## 2024-05-02 NOTE — Progress Notes (Signed)
 [facility]link Summary Report / Loop Recorder

## 2024-05-26 ENCOUNTER — Ambulatory Visit: Payer: Self-pay | Admitting: Cardiology

## 2024-05-26 ENCOUNTER — Ambulatory Visit (INDEPENDENT_AMBULATORY_CARE_PROVIDER_SITE_OTHER)

## 2024-05-26 DIAGNOSIS — I63412 Cerebral infarction due to embolism of left middle cerebral artery: Secondary | ICD-10-CM | POA: Diagnosis not present

## 2024-05-26 LAB — CUP PACEART REMOTE DEVICE CHECK
Date Time Interrogation Session: 20250601231755
Implantable Pulse Generator Implant Date: 20221104

## 2024-06-05 NOTE — Progress Notes (Signed)
 [facility]link Summary Report / Loop Recorder

## 2024-06-26 ENCOUNTER — Ambulatory Visit (INDEPENDENT_AMBULATORY_CARE_PROVIDER_SITE_OTHER)

## 2024-06-26 DIAGNOSIS — I63412 Cerebral infarction due to embolism of left middle cerebral artery: Secondary | ICD-10-CM | POA: Diagnosis not present

## 2024-06-26 LAB — CUP PACEART REMOTE DEVICE CHECK
Date Time Interrogation Session: 20250702231604
Implantable Pulse Generator Implant Date: 20221104

## 2024-06-30 ENCOUNTER — Ambulatory Visit: Payer: Self-pay | Admitting: Cardiology

## 2024-07-14 NOTE — Progress Notes (Signed)
 [facility]link Summary Report / Loop Recorder

## 2024-07-22 ENCOUNTER — Other Ambulatory Visit: Payer: Self-pay | Admitting: Gastroenterology

## 2024-07-22 ENCOUNTER — Encounter: Payer: Self-pay | Admitting: Gastroenterology

## 2024-07-22 DIAGNOSIS — R1310 Dysphagia, unspecified: Secondary | ICD-10-CM

## 2024-07-28 ENCOUNTER — Ambulatory Visit (INDEPENDENT_AMBULATORY_CARE_PROVIDER_SITE_OTHER)

## 2024-07-28 DIAGNOSIS — I63412 Cerebral infarction due to embolism of left middle cerebral artery: Secondary | ICD-10-CM

## 2024-07-28 LAB — CUP PACEART REMOTE DEVICE CHECK
Date Time Interrogation Session: 20250802230710
Implantable Pulse Generator Implant Date: 20221104

## 2024-07-29 ENCOUNTER — Ambulatory Visit: Payer: Self-pay | Admitting: Cardiology

## 2024-08-07 ENCOUNTER — Inpatient Hospital Stay: Admission: RE | Admit: 2024-08-07 | Source: Ambulatory Visit

## 2024-08-28 ENCOUNTER — Ambulatory Visit

## 2024-08-28 DIAGNOSIS — I63412 Cerebral infarction due to embolism of left middle cerebral artery: Secondary | ICD-10-CM | POA: Diagnosis not present

## 2024-08-28 LAB — CUP PACEART REMOTE DEVICE CHECK
Date Time Interrogation Session: 20250903232214
Implantable Pulse Generator Implant Date: 20221104

## 2024-08-29 ENCOUNTER — Ambulatory Visit: Payer: Self-pay | Admitting: Cardiology

## 2024-09-04 ENCOUNTER — Inpatient Hospital Stay: Admission: RE | Admit: 2024-09-04 | Source: Ambulatory Visit

## 2024-09-06 NOTE — Progress Notes (Signed)
 Remote Loop Recorder Transmission

## 2024-09-22 NOTE — Progress Notes (Signed)
 Remote Loop Recorder Transmission

## 2024-09-29 ENCOUNTER — Encounter

## 2024-09-30 ENCOUNTER — Ambulatory Visit

## 2024-09-30 DIAGNOSIS — I639 Cerebral infarction, unspecified: Secondary | ICD-10-CM

## 2024-10-01 LAB — CUP PACEART REMOTE DEVICE CHECK
Date Time Interrogation Session: 20251006231626
Implantable Pulse Generator Implant Date: 20221104

## 2024-10-02 ENCOUNTER — Other Ambulatory Visit

## 2024-10-02 NOTE — Progress Notes (Signed)
 Remote Loop Recorder Transmission

## 2024-10-03 ENCOUNTER — Ambulatory Visit: Payer: Self-pay | Admitting: Cardiology

## 2024-10-03 NOTE — Progress Notes (Signed)
 Remote Loop Recorder Transmission

## 2024-10-23 ENCOUNTER — Other Ambulatory Visit

## 2024-10-23 ENCOUNTER — Inpatient Hospital Stay: Admission: RE | Admit: 2024-10-23 | Source: Ambulatory Visit

## 2024-10-30 ENCOUNTER — Encounter

## 2024-10-30 ENCOUNTER — Encounter (HOSPITAL_BASED_OUTPATIENT_CLINIC_OR_DEPARTMENT_OTHER): Payer: Self-pay | Admitting: Cardiovascular Disease

## 2024-10-31 ENCOUNTER — Ambulatory Visit (INDEPENDENT_AMBULATORY_CARE_PROVIDER_SITE_OTHER)

## 2024-10-31 DIAGNOSIS — I639 Cerebral infarction, unspecified: Secondary | ICD-10-CM

## 2024-11-02 LAB — CUP PACEART REMOTE DEVICE CHECK
Date Time Interrogation Session: 20251106235103
Implantable Pulse Generator Implant Date: 20221104

## 2024-11-04 ENCOUNTER — Ambulatory Visit: Payer: Self-pay | Admitting: Cardiology

## 2024-11-04 NOTE — Progress Notes (Signed)
 Remote Loop Recorder Transmission

## 2024-11-07 ENCOUNTER — Ambulatory Visit (HOSPITAL_COMMUNITY)
Admission: RE | Admit: 2024-11-07 | Discharge: 2024-11-07 | Disposition: A | Discharge status: Home / Self Care | Source: Ambulatory Visit | Attending: Gastroenterology | Admitting: Gastroenterology

## 2024-11-07 DIAGNOSIS — R1310 Dysphagia, unspecified: Secondary | ICD-10-CM | POA: Diagnosis present

## 2024-12-01 ENCOUNTER — Encounter

## 2024-12-01 ENCOUNTER — Ambulatory Visit

## 2024-12-02 LAB — CUP PACEART REMOTE DEVICE CHECK
Date Time Interrogation Session: 20251207231255
Implantable Pulse Generator Implant Date: 20221104

## 2024-12-03 ENCOUNTER — Ambulatory Visit: Payer: Self-pay | Admitting: Cardiology

## 2024-12-04 ENCOUNTER — Other Ambulatory Visit

## 2024-12-09 NOTE — Progress Notes (Signed)
 Remote Loop Recorder Transmission

## 2024-12-12 ENCOUNTER — Encounter (HOSPITAL_COMMUNITY): Payer: Self-pay | Admitting: Emergency Medicine

## 2024-12-12 ENCOUNTER — Emergency Department (HOSPITAL_COMMUNITY)
Admission: EM | Admit: 2024-12-12 | Discharge: 2024-12-12 | Discharge status: Against Medical Advice | Attending: Student | Admitting: Student

## 2024-12-12 ENCOUNTER — Other Ambulatory Visit: Payer: Self-pay

## 2024-12-12 DIAGNOSIS — R112 Nausea with vomiting, unspecified: Secondary | ICD-10-CM | POA: Insufficient documentation

## 2024-12-12 DIAGNOSIS — R531 Weakness: Secondary | ICD-10-CM | POA: Diagnosis not present

## 2024-12-12 DIAGNOSIS — Z5321 Procedure and treatment not carried out due to patient leaving prior to being seen by health care provider: Secondary | ICD-10-CM | POA: Insufficient documentation

## 2024-12-12 LAB — CBC WITH DIFFERENTIAL/PLATELET
Abs Immature Granulocytes: 0.03 K/uL (ref 0.00–0.07)
Basophils Absolute: 0 K/uL (ref 0.0–0.1)
Basophils Relative: 0 %
Eosinophils Absolute: 0 K/uL (ref 0.0–0.5)
Eosinophils Relative: 0 %
HCT: 46.5 % (ref 39.0–52.0)
Hemoglobin: 14.5 g/dL (ref 13.0–17.0)
Immature Granulocytes: 0 %
Lymphocytes Relative: 7 %
Lymphs Abs: 0.5 K/uL — ABNORMAL LOW (ref 0.7–4.0)
MCH: 29.7 pg (ref 26.0–34.0)
MCHC: 31.2 g/dL (ref 30.0–36.0)
MCV: 95.3 fL (ref 80.0–100.0)
Monocytes Absolute: 0.4 K/uL (ref 0.1–1.0)
Monocytes Relative: 5 %
Neutro Abs: 6.7 K/uL (ref 1.7–7.7)
Neutrophils Relative %: 88 %
Platelets: 253 K/uL (ref 150–400)
RBC: 4.88 MIL/uL (ref 4.22–5.81)
RDW: 13.6 % (ref 11.5–15.5)
WBC: 7.7 K/uL (ref 4.0–10.5)
nRBC: 0 % (ref 0.0–0.2)

## 2024-12-12 LAB — RESP PANEL BY RT-PCR (RSV, FLU A&B, COVID)  RVPGX2
Influenza A by PCR: NEGATIVE
Influenza B by PCR: NEGATIVE
Resp Syncytial Virus by PCR: NEGATIVE
SARS Coronavirus 2 by RT PCR: NEGATIVE

## 2024-12-12 LAB — COMPREHENSIVE METABOLIC PANEL WITH GFR
ALT: 27 U/L (ref 0–44)
AST: 36 U/L (ref 15–41)
Albumin: 4.1 g/dL (ref 3.5–5.0)
Alkaline Phosphatase: 83 U/L (ref 38–126)
Anion gap: 12 (ref 5–15)
BUN: 13 mg/dL (ref 8–23)
CO2: 24 mmol/L (ref 22–32)
Calcium: 9.1 mg/dL (ref 8.9–10.3)
Chloride: 100 mmol/L (ref 98–111)
Creatinine, Ser: 1.18 mg/dL (ref 0.61–1.24)
GFR, Estimated: >60 mL/min
Glucose, Bld: 118 mg/dL — ABNORMAL HIGH (ref 70–99)
Potassium: 4.3 mmol/L (ref 3.5–5.1)
Sodium: 137 mmol/L (ref 135–145)
Total Bilirubin: 0.5 mg/dL (ref 0.0–1.2)
Total Protein: 7.5 g/dL (ref 6.5–8.1)

## 2024-12-12 LAB — LIPASE, BLOOD: Lipase: 31 U/L (ref 11–51)

## 2024-12-12 MED ORDER — METOCLOPRAMIDE HCL 5 MG/ML IJ SOLN
10.0000 mg | Freq: Once | INTRAMUSCULAR | Status: AC
  Administered 2024-12-12: 10 mg via INTRAVENOUS
  Filled 2024-12-12: qty 2

## 2024-12-12 NOTE — ED Provider Triage Note (Signed)
 Emergency Medicine Provider Triage Evaluation Note  Scott Brandt , a 63 y.o. male  was evaluated in triage.  Pt complains of N/V, weakness at hom x a few hours.  Review of Systems  Positive:  As above, chills Negative: D, fever, CP, SOB  Physical Exam  BP (!) 149/85 (BP Location: Right Arm)   Pulse 83   Temp 99.4 F (37.4 C)   Resp 17   Ht 6' (1.829 m)   Wt 105 kg   SpO2 96%   BMI 31.39 kg/m  Gen:   Awake, no distress   Resp:  Normal effort  MSK:   Moves extremities without difficulty  Other:  RRR no m/r/g. Abdomen soft, nondistended, nontender  Medical Decision Making  Medically screening exam initiated at 1:49 AM.  Appropriate orders placed.  Camellia LITTIE Calamity was informed that the remainder of the evaluation will be completed by another provider, this initial triage assessment does not replace that evaluation, and the importance of remaining in the ED until their evaluation is complete.  This chart was dictated using voice recognition software, Dragon. Despite the best efforts of this provider to proofread and correct errors, errors may still occur which can change documentation meaning.    Bobette Pleasant SAUNDERS, PA-C 12/12/24 [phone]

## 2024-12-12 NOTE — ED Notes (Addendum)
 Pt stated he is feeling better and wanted to leave.

## 2024-12-12 NOTE — ED Triage Notes (Addendum)
 Pt bib ems from home with c/o n/v x a few hours.   4mg  zofran 

## 2025-01-01 ENCOUNTER — Ambulatory Visit

## 2025-01-01 DIAGNOSIS — I639 Cerebral infarction, unspecified: Secondary | ICD-10-CM | POA: Diagnosis not present

## 2025-01-02 ENCOUNTER — Ambulatory Visit: Payer: Self-pay | Admitting: Cardiology

## 2025-01-02 LAB — CUP PACEART REMOTE DEVICE CHECK
Date Time Interrogation Session: 20260107230951
Implantable Pulse Generator Implant Date: 20221104

## 2025-01-02 NOTE — Progress Notes (Signed)
 Remote Loop Recorder Transmission

## 2025-01-05 ENCOUNTER — Other Ambulatory Visit (HOSPITAL_BASED_OUTPATIENT_CLINIC_OR_DEPARTMENT_OTHER): Payer: Self-pay | Admitting: Cardiovascular Disease

## 2025-01-13 ENCOUNTER — Encounter (HOSPITAL_BASED_OUTPATIENT_CLINIC_OR_DEPARTMENT_OTHER): Payer: Self-pay | Admitting: *Deleted

## 2025-01-15 ENCOUNTER — Encounter (HOSPITAL_BASED_OUTPATIENT_CLINIC_OR_DEPARTMENT_OTHER): Payer: Self-pay

## 2025-01-16 ENCOUNTER — Ambulatory Visit (HOSPITAL_BASED_OUTPATIENT_CLINIC_OR_DEPARTMENT_OTHER): Admitting: Cardiovascular Disease

## 2025-01-16 ENCOUNTER — Encounter (HOSPITAL_BASED_OUTPATIENT_CLINIC_OR_DEPARTMENT_OTHER): Payer: Self-pay | Admitting: Cardiovascular Disease

## 2025-01-16 VITALS — BP 142/84 | HR 59 | Ht 72.0 in | Wt 234.0 lb

## 2025-01-16 DIAGNOSIS — E7800 Pure hypercholesterolemia, unspecified: Secondary | ICD-10-CM

## 2025-01-16 DIAGNOSIS — I253 Aneurysm of heart: Secondary | ICD-10-CM

## 2025-01-16 DIAGNOSIS — I63412 Cerebral infarction due to embolism of left middle cerebral artery: Secondary | ICD-10-CM

## 2025-01-16 DIAGNOSIS — I10 Essential (primary) hypertension: Secondary | ICD-10-CM

## 2025-01-16 DIAGNOSIS — I639 Cerebral infarction, unspecified: Secondary | ICD-10-CM

## 2025-01-16 MED ORDER — HYDROCHLOROTHIAZIDE 12.5 MG PO TABS: 12.5000 mg | ORAL_TABLET | Freq: Every day | ORAL | 3 refills | Status: AC

## 2025-01-16 NOTE — Patient Instructions (Signed)
 Please have you primary care doctor send us  your cholesterol levels.  If your LDL is >70, please increase atorvastatin  back to 20mg   LABS (BMET) when your primary does your labs   Start hydrochlorothiazide 12.5 mg daily   See PHARM D in 1 to 2 months   Your physician wants you to follow-up in: 1 year  You will receive a reminder letter in the mail two months in advance. If you don't receive a letter, please call our office to schedule the follow-up appointment.

## 2025-01-16 NOTE — Progress Notes (Unsigned)
 " Cardiology Office Note:  .   Date:  01/16/2025  ID:  Scott Brandt, DOB June 26, 1961, MRN [id number] PCP: Dorena Fernando HERO, MD  [facility] Providers Cardiologist:  Annabella Scarce, MD { Click to update primary MD,subspecialty MD or APP then REFRESH:1}   History of Present Illness: .   Scott Brandt is a 64 y.o. male with hyperlipidemia, asthma, anxiety, stroke and PFO, s/p Amplatzer who presents for follow up.  He was initially seen 09/2017 and reported palpitations only occurring in the setting of stressful situations.  The palpitations subsided after the stressful stimulus was over.  He exercised 4 times per week without exertional symptoms.  No further testing was performed.  He was very hypertensive in clinic.  It was unclear if this was due to anxiety.  He was started on amlodipine  2.5 mg daily and asked to check his blood pressure at home.  He also had a coronary calcium  score that showed one area of calcium  in the left anterior descending.  His calcium  score was 4, which is 44th percentile for age and gender.  It was recommended that he start taking his atorvastatin  daily.   Scott Brandt stopped his amlodipine  and was trying to control his BP with diet and exercise.  However his blood pressure became quite elevated. Amlodipine  was restarted at 5 mg daily and later increased to 10 mg daily. He had an echocardiogram 02/11/2020 that revealed LVEF 55 to 60% with grade 1 diastolic dysfunction.  He had moderate LVH.  The echo was otherwise unremarkable.  He had a repeat echo 07/2020 that revealed LVEF 55 to 60% with grade 1 diastolic dysfunction and mild LVH.   Scott Brandt blood pressure remained a little elevated.  He wanted to work on diet and exercise prior to putting hydrochlorothiazide. He was admitted 10/2021 with a stroke. He presented with expressive aphasia and a right facial droop. He received TNK with near resolution of symptoms. He had a TEE with LVEF 60-65% and a small PFO. A loop recorder  was inserted. Aspirin  was switched to clopidogrel .    He saw Dr. Wonda and underwent PFO closure using an Amplatzer 25 mm PFO occluder device in 01/2022. He stated that he was feeling fine with no issues regarding his loop recorder. Since his PFO closure, he noticed that his blood pressure was a lot lower than it used to be, possibly due to lower stress levels. He held his statin due to myalgias. He then switched it to every other day.    At his visit 10/2022, his diastolic blood pressure was elevated in the office. Home blood pressures were mostly in the 130s systolic with occasional 120s. He was going to work on being more consistent with his diet and limiting sodium intake. He was instructed to take his atorvastatin  every day. On 02/19/23 he followed up with Kate Minus, NP and his blood pressure was 118/80. His loop recorder had not shown any atrial fibrillation. Limited echocardiogram was performed with negative bubble and no evidence of intra-atrial shunting. No longer required dental SBE.    At his visit 07/2023 he reporte dheadaches but was otherwise well.     Discussed the use of AI scribe software for clinical note transcription with the patient, who gave verbal consent to proceed.  History of Present Illness     ROS:  As per HPI  Studies Reviewed: .        06/2024: Tc 143, TRI 64, hdl65, ldl 65 ***  Risk Assessment/Calculations:   {Does this patient have ATRIAL FIBRILLATION?:[phone]} The patient's 1st BP is elevated (>139/89)*** Repeat BP and {Click to enter a 2nd BP Refresh Note  :1}       Physical Exam:   VS:  BP (!) 142/84 (BP Location: Left Arm, Patient Position: Sitting, Cuff Size: Large)   Pulse (!) 59   Ht 6' (1.829 m)   Wt 234 lb (106.1 kg)   SpO2 98%   BMI 31.74 kg/m  , BMI Body mass index is 31.74 kg/m. GENERAL:  Well appearing HEENT: Pupils equal round and reactive, fundi not visualized, oral mucosa unremarkable NECK:  No jugular venous distention,  waveform within normal limits, carotid upstroke brisk and symmetric, no bruits, no thyromegaly LYMPHATICS:  No cervical adenopathy LUNGS:  Clear to auscultation bilaterally HEART:  RRR.  PMI not displaced or sustained,S1 and S2 within normal limits, no S3, no S4, no clicks, no rubs, *** murmurs ABD:  Flat, positive bowel sounds normal in frequency in pitch, no bruits, no rebound, no guarding, no midline pulsatile mass, no hepatomegaly, no splenomegaly EXT:  2 plus pulses throughout, no edema, no cyanosis no clubbing SKIN:  No rashes no nodules NEURO:  Cranial nerves II through XII grossly intact, motor grossly intact throughout PSYCH:  Cognitively intact, oriented to person place and time   ASSESSMENT AND PLAN: .   *** Assessment and Plan Assessment & Plan         {Are you ordering a CV Procedure (e.g. stress test, cath, DCCV, TEE, etc)?   Press F2        :[id number]}  Dispo: ***  Signed, Annabella Scarce, MD   "

## 2025-01-23 ENCOUNTER — Ambulatory Visit (HOSPITAL_BASED_OUTPATIENT_CLINIC_OR_DEPARTMENT_OTHER): Admitting: Cardiovascular Disease

## 2025-02-01 ENCOUNTER — Encounter

## 2025-03-03 ENCOUNTER — Ambulatory Visit: Admitting: Pharmacist Clinician (PhC)/ Clinical Pharmacy Specialist

## 2025-03-04 ENCOUNTER — Ambulatory Visit

## 2025-04-04 ENCOUNTER — Ambulatory Visit

## 2025-05-05 ENCOUNTER — Ambulatory Visit

## 2025-06-05 ENCOUNTER — Ambulatory Visit

## 2025-07-06 ENCOUNTER — Ambulatory Visit

## 2025-08-06 ENCOUNTER — Ambulatory Visit

## 2025-09-06 ENCOUNTER — Ambulatory Visit

## 2025-10-07 ENCOUNTER — Ambulatory Visit

## 2025-11-07 ENCOUNTER — Ambulatory Visit

## 2025-12-08 ENCOUNTER — Ambulatory Visit

## 2026-01-08 ENCOUNTER — Ambulatory Visit
